# Patient Record
Sex: Female | Born: 1955
Health system: Southern US, Community
[De-identification: ages and names within clinical notes are randomized; demographics above are authoritative.]

## PROBLEM LIST (undated history)

## (undated) DIAGNOSIS — E785 Hyperlipidemia, unspecified: Secondary | ICD-10-CM

## (undated) DIAGNOSIS — E0590 Thyrotoxicosis, unspecified without thyrotoxic crisis or storm: Secondary | ICD-10-CM

## (undated) DIAGNOSIS — D259 Leiomyoma of uterus, unspecified: Secondary | ICD-10-CM

## (undated) DIAGNOSIS — K219 Gastro-esophageal reflux disease without esophagitis: Secondary | ICD-10-CM

## (undated) DIAGNOSIS — E119 Type 2 diabetes mellitus without complications: Secondary | ICD-10-CM

## (undated) DIAGNOSIS — R011 Cardiac murmur, unspecified: Secondary | ICD-10-CM

## (undated) DIAGNOSIS — M5136 Other intervertebral disc degeneration, lumbar region: Secondary | ICD-10-CM

## (undated) DIAGNOSIS — E049 Nontoxic goiter, unspecified: Secondary | ICD-10-CM

## (undated) DIAGNOSIS — M51369 Other intervertebral disc degeneration, lumbar region without mention of lumbar back pain or lower extremity pain: Secondary | ICD-10-CM

## (undated) DIAGNOSIS — I10 Essential (primary) hypertension: Secondary | ICD-10-CM

## (undated) HISTORY — DX: Other intervertebral disc degeneration, lumbar region: M51.36

## (undated) HISTORY — DX: Gastro-esophageal reflux disease without esophagitis: K21.9

## (undated) HISTORY — DX: Leiomyoma of uterus, unspecified: D25.9

## (undated) HISTORY — DX: Hyperlipidemia, unspecified: E78.5

## (undated) HISTORY — DX: Thyrotoxicosis, unspecified without thyrotoxic crisis or storm: E05.90

## (undated) HISTORY — PX: TUBAL LIGATION: SHX77

## (undated) HISTORY — DX: Cardiac murmur, unspecified: R01.1

## (undated) HISTORY — DX: Nontoxic goiter, unspecified: E04.9

## (undated) HISTORY — PX: WISDOM TOOTH EXTRACTION: SHX21

## (undated) HISTORY — DX: Other intervertebral disc degeneration, lumbar region without mention of lumbar back pain or lower extremity pain: M51.369

## (undated) HISTORY — DX: Type 2 diabetes mellitus without complications: E11.9

## (undated) HISTORY — DX: Essential (primary) hypertension: I10

---

## 1979-05-14 HISTORY — PX: TUBAL LIGATION: SHX77

## 2001-11-06 ENCOUNTER — Encounter: Admission: RE | Admit: 2001-11-06 | Discharge: 2001-11-06 | Payer: Self-pay | Admitting: Internal Medicine

## 2001-11-06 ENCOUNTER — Encounter: Payer: Self-pay | Admitting: Internal Medicine

## 2002-05-28 ENCOUNTER — Encounter: Payer: Self-pay | Admitting: Internal Medicine

## 2002-05-28 ENCOUNTER — Encounter: Admission: RE | Admit: 2002-05-28 | Discharge: 2002-05-28 | Payer: Self-pay | Admitting: Internal Medicine

## 2003-09-13 ENCOUNTER — Encounter: Payer: Self-pay | Admitting: Internal Medicine

## 2003-12-06 ENCOUNTER — Encounter: Admission: RE | Admit: 2003-12-06 | Discharge: 2003-12-06 | Payer: Self-pay | Admitting: Internal Medicine

## 2004-05-27 ENCOUNTER — Emergency Department (HOSPITAL_COMMUNITY): Admission: EM | Admit: 2004-05-27 | Discharge: 2004-05-27 | Payer: Self-pay | Admitting: Emergency Medicine

## 2004-05-28 ENCOUNTER — Ambulatory Visit: Payer: Self-pay | Admitting: Internal Medicine

## 2004-05-28 ENCOUNTER — Encounter: Admission: RE | Admit: 2004-05-28 | Discharge: 2004-05-28 | Payer: Self-pay | Admitting: Internal Medicine

## 2004-06-07 ENCOUNTER — Ambulatory Visit: Payer: Self-pay | Admitting: Internal Medicine

## 2004-06-14 ENCOUNTER — Ambulatory Visit: Payer: Self-pay | Admitting: Internal Medicine

## 2004-07-10 ENCOUNTER — Ambulatory Visit: Payer: Self-pay | Admitting: Internal Medicine

## 2004-07-11 ENCOUNTER — Encounter: Admission: RE | Admit: 2004-07-11 | Discharge: 2004-10-09 | Payer: Self-pay | Admitting: Internal Medicine

## 2004-07-18 ENCOUNTER — Other Ambulatory Visit: Admission: RE | Admit: 2004-07-18 | Discharge: 2004-07-18 | Payer: Self-pay | Admitting: Gynecology

## 2004-07-26 ENCOUNTER — Ambulatory Visit: Payer: Self-pay | Admitting: Internal Medicine

## 2004-08-08 ENCOUNTER — Ambulatory Visit: Payer: Self-pay | Admitting: Internal Medicine

## 2004-08-15 ENCOUNTER — Ambulatory Visit: Payer: Self-pay | Admitting: Internal Medicine

## 2004-11-29 ENCOUNTER — Ambulatory Visit: Payer: Self-pay | Admitting: Internal Medicine

## 2005-01-18 ENCOUNTER — Encounter: Admission: RE | Admit: 2005-01-18 | Discharge: 2005-04-18 | Payer: Self-pay | Admitting: Internal Medicine

## 2005-03-15 ENCOUNTER — Ambulatory Visit: Payer: Self-pay | Admitting: Internal Medicine

## 2005-04-11 ENCOUNTER — Ambulatory Visit: Payer: Self-pay | Admitting: Internal Medicine

## 2005-05-13 DIAGNOSIS — K219 Gastro-esophageal reflux disease without esophagitis: Secondary | ICD-10-CM

## 2005-05-13 HISTORY — DX: Gastro-esophageal reflux disease without esophagitis: K21.9

## 2005-09-12 ENCOUNTER — Ambulatory Visit: Payer: Self-pay | Admitting: Internal Medicine

## 2005-10-09 ENCOUNTER — Encounter: Admission: RE | Admit: 2005-10-09 | Discharge: 2005-10-09 | Payer: Self-pay | Admitting: Anesthesiology

## 2005-10-14 ENCOUNTER — Emergency Department (HOSPITAL_COMMUNITY): Admission: EM | Admit: 2005-10-14 | Discharge: 2005-10-14 | Payer: Self-pay | Admitting: Emergency Medicine

## 2005-12-23 ENCOUNTER — Ambulatory Visit: Payer: Self-pay | Admitting: Internal Medicine

## 2006-01-08 ENCOUNTER — Encounter: Admission: RE | Admit: 2006-01-08 | Discharge: 2006-01-08 | Payer: Self-pay | Admitting: Obstetrics and Gynecology

## 2006-01-14 ENCOUNTER — Ambulatory Visit: Payer: Self-pay | Admitting: Internal Medicine

## 2006-02-07 ENCOUNTER — Ambulatory Visit (HOSPITAL_COMMUNITY): Admission: RE | Admit: 2006-02-07 | Discharge: 2006-02-07 | Payer: Self-pay | Admitting: Gastroenterology

## 2006-08-06 ENCOUNTER — Ambulatory Visit: Payer: Self-pay | Admitting: Internal Medicine

## 2006-08-06 LAB — CONVERTED CEMR LAB
AST: 21 units/L (ref 0–37)
Calcium: 9.8 mg/dL (ref 8.4–10.5)
Chloride: 105 meq/L (ref 96–112)
Creatinine, Ser: 0.9 mg/dL (ref 0.4–1.2)
Eosinophils Absolute: 0.2 10*3/uL (ref 0.0–0.6)
Eosinophils Relative: 3.5 % (ref 0.0–5.0)
GFR calc non Af Amer: 71 mL/min
Glucose, Bld: 81 mg/dL (ref 70–99)
HCT: 39.9 % (ref 36.0–46.0)
Hgb A1c MFr Bld: 6.2 % — ABNORMAL HIGH (ref 4.6–6.0)
MCV: 83.8 fL (ref 78.0–100.0)
Platelets: 256 10*3/uL (ref 150–400)
RBC: 4.76 M/uL (ref 3.87–5.11)
RDW: 12.3 % (ref 11.5–14.6)
WBC: 5.1 10*3/uL (ref 4.5–10.5)

## 2006-09-01 ENCOUNTER — Other Ambulatory Visit: Admission: RE | Admit: 2006-09-01 | Discharge: 2006-09-01 | Payer: Self-pay | Admitting: Obstetrics & Gynecology

## 2006-09-11 ENCOUNTER — Encounter: Admission: RE | Admit: 2006-09-11 | Discharge: 2006-09-11 | Payer: Self-pay | Admitting: Occupational Medicine

## 2006-11-05 ENCOUNTER — Ambulatory Visit: Payer: Self-pay | Admitting: Internal Medicine

## 2006-11-05 DIAGNOSIS — E785 Hyperlipidemia, unspecified: Secondary | ICD-10-CM | POA: Insufficient documentation

## 2006-11-05 DIAGNOSIS — I10 Essential (primary) hypertension: Secondary | ICD-10-CM | POA: Insufficient documentation

## 2006-11-11 ENCOUNTER — Encounter (INDEPENDENT_AMBULATORY_CARE_PROVIDER_SITE_OTHER): Payer: Self-pay | Admitting: *Deleted

## 2007-09-02 ENCOUNTER — Other Ambulatory Visit: Admission: RE | Admit: 2007-09-02 | Discharge: 2007-09-02 | Payer: Self-pay | Admitting: Obstetrics & Gynecology

## 2007-10-13 ENCOUNTER — Ambulatory Visit: Payer: Self-pay | Admitting: Internal Medicine

## 2007-10-13 DIAGNOSIS — R635 Abnormal weight gain: Secondary | ICD-10-CM | POA: Insufficient documentation

## 2007-10-13 LAB — CONVERTED CEMR LAB
Cholesterol, target level: 200 mg/dL
HDL goal, serum: 40 mg/dL
LDL Goal: 160 mg/dL

## 2007-10-23 LAB — CONVERTED CEMR LAB
ALT: 23 units/L (ref 0–35)
Bilirubin, Direct: 0.1 mg/dL (ref 0.0–0.3)
Free T4: 0.9 ng/dL (ref 0.6–1.6)
Potassium: 3.8 meq/L (ref 3.5–5.1)
TSH: 0.42 microintl units/mL (ref 0.35–5.50)
Total Bilirubin: 1 mg/dL (ref 0.3–1.2)
VLDL: 20 mg/dL (ref 0–40)

## 2007-10-29 ENCOUNTER — Telehealth (INDEPENDENT_AMBULATORY_CARE_PROVIDER_SITE_OTHER): Payer: Self-pay | Admitting: *Deleted

## 2007-11-17 ENCOUNTER — Encounter: Admission: RE | Admit: 2007-11-17 | Discharge: 2008-01-07 | Payer: Self-pay | Admitting: Internal Medicine

## 2007-11-17 ENCOUNTER — Encounter: Payer: Self-pay | Admitting: Internal Medicine

## 2008-01-07 ENCOUNTER — Encounter: Payer: Self-pay | Admitting: Internal Medicine

## 2008-04-01 ENCOUNTER — Ambulatory Visit: Payer: Self-pay | Admitting: Internal Medicine

## 2008-04-01 ENCOUNTER — Telehealth (INDEPENDENT_AMBULATORY_CARE_PROVIDER_SITE_OTHER): Payer: Self-pay | Admitting: *Deleted

## 2008-04-01 DIAGNOSIS — M549 Dorsalgia, unspecified: Secondary | ICD-10-CM | POA: Insufficient documentation

## 2008-04-01 LAB — CONVERTED CEMR LAB
Ketones, urine, test strip: NEGATIVE
Nitrite: NEGATIVE
WBC, UA: NONE SEEN cells/hpf (ref <3)
pH: 6.5

## 2008-04-02 ENCOUNTER — Encounter: Payer: Self-pay | Admitting: Internal Medicine

## 2008-04-05 ENCOUNTER — Telehealth: Payer: Self-pay | Admitting: Internal Medicine

## 2008-04-13 ENCOUNTER — Encounter: Admission: RE | Admit: 2008-04-13 | Discharge: 2008-04-13 | Payer: Self-pay | Admitting: Internal Medicine

## 2008-04-15 ENCOUNTER — Telehealth (INDEPENDENT_AMBULATORY_CARE_PROVIDER_SITE_OTHER): Payer: Self-pay | Admitting: *Deleted

## 2008-05-30 ENCOUNTER — Telehealth (INDEPENDENT_AMBULATORY_CARE_PROVIDER_SITE_OTHER): Payer: Self-pay | Admitting: *Deleted

## 2008-08-11 LAB — CONVERTED CEMR LAB

## 2008-08-30 ENCOUNTER — Ambulatory Visit: Payer: Self-pay | Admitting: Internal Medicine

## 2008-08-30 DIAGNOSIS — M5137 Other intervertebral disc degeneration, lumbosacral region: Secondary | ICD-10-CM | POA: Insufficient documentation

## 2008-08-30 DIAGNOSIS — R946 Abnormal results of thyroid function studies: Secondary | ICD-10-CM | POA: Insufficient documentation

## 2008-08-30 DIAGNOSIS — R7309 Other abnormal glucose: Secondary | ICD-10-CM | POA: Insufficient documentation

## 2008-08-30 DIAGNOSIS — K219 Gastro-esophageal reflux disease without esophagitis: Secondary | ICD-10-CM | POA: Insufficient documentation

## 2008-09-03 ENCOUNTER — Encounter: Payer: Self-pay | Admitting: Internal Medicine

## 2008-09-06 ENCOUNTER — Encounter (INDEPENDENT_AMBULATORY_CARE_PROVIDER_SITE_OTHER): Payer: Self-pay | Admitting: *Deleted

## 2008-09-06 ENCOUNTER — Encounter: Admission: RE | Admit: 2008-09-06 | Discharge: 2008-09-06 | Payer: Self-pay | Admitting: Internal Medicine

## 2008-09-07 ENCOUNTER — Encounter (INDEPENDENT_AMBULATORY_CARE_PROVIDER_SITE_OTHER): Payer: Self-pay | Admitting: *Deleted

## 2008-09-07 ENCOUNTER — Telehealth: Payer: Self-pay | Admitting: Internal Medicine

## 2008-09-13 ENCOUNTER — Ambulatory Visit: Payer: Self-pay | Admitting: Internal Medicine

## 2008-09-13 DIAGNOSIS — E042 Nontoxic multinodular goiter: Secondary | ICD-10-CM | POA: Insufficient documentation

## 2008-09-14 ENCOUNTER — Encounter: Payer: Self-pay | Admitting: Internal Medicine

## 2008-09-18 LAB — CONVERTED CEMR LAB: Thyroglobulin Ab: 36.8 (ref 0.0–60.0)

## 2008-09-20 ENCOUNTER — Ambulatory Visit: Payer: Self-pay | Admitting: Endocrinology

## 2008-09-20 ENCOUNTER — Encounter (INDEPENDENT_AMBULATORY_CARE_PROVIDER_SITE_OTHER): Payer: Self-pay | Admitting: *Deleted

## 2008-10-05 ENCOUNTER — Telehealth (INDEPENDENT_AMBULATORY_CARE_PROVIDER_SITE_OTHER): Payer: Self-pay | Admitting: *Deleted

## 2008-10-12 ENCOUNTER — Encounter (INDEPENDENT_AMBULATORY_CARE_PROVIDER_SITE_OTHER): Payer: Self-pay | Admitting: *Deleted

## 2008-10-31 ENCOUNTER — Encounter (HOSPITAL_COMMUNITY): Admission: RE | Admit: 2008-10-31 | Discharge: 2009-01-29 | Payer: Self-pay | Admitting: Endocrinology

## 2008-11-02 DIAGNOSIS — Z8639 Personal history of other endocrine, nutritional and metabolic disease: Secondary | ICD-10-CM | POA: Insufficient documentation

## 2008-11-15 ENCOUNTER — Encounter (INDEPENDENT_AMBULATORY_CARE_PROVIDER_SITE_OTHER): Payer: Self-pay | Admitting: *Deleted

## 2009-01-19 ENCOUNTER — Telehealth (INDEPENDENT_AMBULATORY_CARE_PROVIDER_SITE_OTHER): Payer: Self-pay | Admitting: *Deleted

## 2009-01-27 ENCOUNTER — Ambulatory Visit: Payer: Self-pay | Admitting: Endocrinology

## 2009-01-27 DIAGNOSIS — R000 Tachycardia, unspecified: Secondary | ICD-10-CM | POA: Insufficient documentation

## 2009-02-17 ENCOUNTER — Ambulatory Visit (HOSPITAL_COMMUNITY): Admission: RE | Admit: 2009-02-17 | Discharge: 2009-02-17 | Payer: Self-pay | Admitting: Endocrinology

## 2009-03-22 ENCOUNTER — Telehealth (INDEPENDENT_AMBULATORY_CARE_PROVIDER_SITE_OTHER): Payer: Self-pay | Admitting: *Deleted

## 2009-04-10 ENCOUNTER — Telehealth (INDEPENDENT_AMBULATORY_CARE_PROVIDER_SITE_OTHER): Payer: Self-pay | Admitting: *Deleted

## 2009-04-13 ENCOUNTER — Ambulatory Visit: Payer: Self-pay | Admitting: Endocrinology

## 2009-05-13 DIAGNOSIS — E0590 Thyrotoxicosis, unspecified without thyrotoxic crisis or storm: Secondary | ICD-10-CM

## 2009-05-13 HISTORY — PX: OTHER SURGICAL HISTORY: SHX169

## 2009-05-13 HISTORY — DX: Thyrotoxicosis, unspecified without thyrotoxic crisis or storm: E05.90

## 2009-05-18 ENCOUNTER — Ambulatory Visit: Payer: Self-pay | Admitting: Endocrinology

## 2009-08-04 ENCOUNTER — Ambulatory Visit: Payer: Self-pay | Admitting: Endocrinology

## 2009-12-15 ENCOUNTER — Ambulatory Visit: Payer: Self-pay | Admitting: Endocrinology

## 2009-12-18 ENCOUNTER — Telehealth (INDEPENDENT_AMBULATORY_CARE_PROVIDER_SITE_OTHER): Payer: Self-pay | Admitting: *Deleted

## 2009-12-20 ENCOUNTER — Ambulatory Visit: Payer: Self-pay | Admitting: Internal Medicine

## 2010-04-24 ENCOUNTER — Encounter (INDEPENDENT_AMBULATORY_CARE_PROVIDER_SITE_OTHER): Payer: Self-pay | Admitting: *Deleted

## 2010-04-24 ENCOUNTER — Ambulatory Visit: Payer: Self-pay | Admitting: Internal Medicine

## 2010-04-24 DIAGNOSIS — R51 Headache: Secondary | ICD-10-CM | POA: Insufficient documentation

## 2010-04-24 DIAGNOSIS — R519 Headache, unspecified: Secondary | ICD-10-CM | POA: Insufficient documentation

## 2010-04-24 DIAGNOSIS — E119 Type 2 diabetes mellitus without complications: Secondary | ICD-10-CM | POA: Insufficient documentation

## 2010-04-25 LAB — CONVERTED CEMR LAB
Creatinine,U: 88.1 mg/dL
Microalb Creat Ratio: 1.2 mg/g (ref 0.0–30.0)
Microalb, Ur: 1.1 mg/dL (ref 0.0–1.9)

## 2010-06-04 ENCOUNTER — Encounter: Payer: Self-pay | Admitting: Endocrinology

## 2010-06-04 ENCOUNTER — Ambulatory Visit
Admission: RE | Admit: 2010-06-04 | Discharge: 2010-06-04 | Payer: Self-pay | Source: Home / Self Care | Attending: Internal Medicine | Admitting: Internal Medicine

## 2010-06-04 DIAGNOSIS — M545 Low back pain, unspecified: Secondary | ICD-10-CM | POA: Insufficient documentation

## 2010-06-10 LAB — CONVERTED CEMR LAB
ALT: 19 units/L (ref 0–35)
ALT: 22 units/L (ref 0–35)
AST: 20 units/L (ref 0–37)
Albumin: 4.5 g/dL (ref 3.5–5.2)
Alkaline Phosphatase: 100 units/L (ref 39–117)
Alkaline Phosphatase: 108 units/L (ref 39–117)
Alkaline Phosphatase: 95 units/L (ref 39–117)
BUN: 16 mg/dL (ref 6–23)
BUN: 17 mg/dL (ref 6–23)
Basophils Absolute: 0.1 10*3/uL (ref 0.0–0.1)
Basophils Absolute: 0.1 10*3/uL (ref 0.0–0.1)
Basophils Relative: 0 % (ref 0.0–3.0)
Bilirubin, Direct: 0.1 mg/dL (ref 0.0–0.3)
Bilirubin, Direct: 0.1 mg/dL (ref 0.0–0.3)
Calcium: 10 mg/dL (ref 8.4–10.5)
Calcium: 10.4 mg/dL (ref 8.4–10.5)
Cholesterol, target level: 200 mg/dL
Cholesterol: 184 mg/dL (ref 0–200)
Cholesterol: 204 mg/dL (ref 0–200)
Creatinine, Ser: 1 mg/dL (ref 0.4–1.2)
Creatinine, Ser: 1 mg/dL (ref 0.4–1.2)
Direct LDL: 130.2 mg/dL
Eosinophils Absolute: 0.2 10*3/uL (ref 0.0–0.6)
Eosinophils Absolute: 0.2 10*3/uL (ref 0.0–0.7)
Eosinophils Relative: 3.3 % (ref 0.0–5.0)
Eosinophils Relative: 3.5 % (ref 0.0–5.0)
Free T4: 0.7 ng/dL (ref 0.6–1.6)
GFR calc Af Amer: 61 mL/min
GFR calc non Af Amer: 51 mL/min
GFR calc non Af Amer: 74.98 mL/min (ref >60)
GFR calc non Af Amer: 75.47 mL/min (ref >60)
Glucose, Bld: 89 mg/dL (ref 70–99)
HCT: 44 % (ref 36.0–46.0)
HDL: 46.7 mg/dL (ref >39.00)
HDL: 48.2 mg/dL (ref >39.0)
HDL: 50.1 mg/dL (ref >39.00)
Hemoglobin: 12.8 g/dL (ref 12.0–15.0)
LDL Cholesterol: 113 mg/dL — ABNORMAL HIGH (ref 0–99)
LDL Cholesterol: 120 mg/dL — ABNORMAL HIGH (ref 0–99)
Lymphocytes Relative: 32.1 % (ref 12.0–46.0)
Lymphocytes Relative: 38 % (ref 12.0–46.0)
Lymphocytes Relative: 38.5 % (ref 12.0–46.0)
MCHC: 32.4 g/dL (ref 30.0–36.0)
Monocytes Absolute: 0.3 10*3/uL (ref 0.2–0.7)
Monocytes Relative: 4.9 % (ref 3.0–11.0)
Monocytes Relative: 6.8 % (ref 3.0–12.0)
Neutro Abs: 3 10*3/uL (ref 1.4–7.7)
Neutrophils Relative %: 53.4 % (ref 43.0–77.0)
Neutrophils Relative %: 56.8 % (ref 43.0–77.0)
Platelets: 234 10*3/uL (ref 150.0–400.0)
Potassium: 4.3 meq/L (ref 3.5–5.1)
Potassium: 4.6 meq/L (ref 3.5–5.1)
RBC: 4.92 M/uL (ref 3.87–5.11)
RDW: 13.7 % (ref 11.5–14.6)
T3, Free: 3.5 pg/mL (ref 2.3–4.2)
TSH: 0.37 microintl units/mL (ref 0.35–5.50)
TSH: 0.4 microintl units/mL (ref 0.35–5.50)
TSH: 1.27 microintl units/mL (ref 0.35–5.50)
Total Bilirubin: 0.5 mg/dL (ref 0.3–1.2)
Total Bilirubin: 0.8 mg/dL (ref 0.3–1.2)
Total CHOL/HDL Ratio: 4
Total CHOL/HDL Ratio: 4.2
Total Protein: 8.1 g/dL (ref 6.0–8.3)
Total Protein: 8.2 g/dL (ref 6.0–8.3)
Triglycerides: 83 mg/dL (ref 0.0–149.0)
VLDL: 16.6 mg/dL (ref 0.0–40.0)
VLDL: 20.6 mg/dL (ref 0.0–40.0)
WBC: 5.6 10*3/uL (ref 4.5–10.5)
WBC: 6.8 10*3/uL (ref 4.5–10.5)

## 2010-06-12 NOTE — Assessment & Plan Note (Signed)
Summary: CPX AND FASTING LABS////SPH   Vital Signs:  Patient profile:   55 year old female Height:      67.25 inches Weight:      226.8 pounds BMI:     35.39 Temp:     98.7 degrees F oral Pulse rate:   64 / minute Resp:     14 per minute BP sitting:   114 / 70  (left arm) Cuff size:   large  Vitals Entered By: Shonna Chock CMA (December 20, 2009 9:56 AM)  Comments **Patient was offered to update TDaP vaccine, patient decided to decline at this time   History of Present Illness: Kathleen Lucero is here for a physical; she is asymptomatic. Hyperlipidemia Follow-Up      This is a 55 year old woman who presents for Hyperlipidemia follow-up.  The patient denies muscle aches, GI upset, abdominal pain, flushing, itching, constipation, diarrhea, and fatigue.  Other symptoms include pedal edema occasionally @ end of day.  The patient denies the following symptoms: chest pain/pressure, exercise intolerance, dypsnea, palpitations, and syncope.  Compliance with medications (by patient report) has been near 100%.  Dietary compliance has been fair.  Adjunctive measures currently used by the patient include fish oil supplements.  Hypertension Follow-Up      The patient also presents for Hypertension follow-up.  The patient denies lightheadedness, urinary frequency, and headaches.  Compliance with medications (by patient report) has been near 100%.  Adjunctive measures currently used by the patient include salt restriction.  BP @ home 115-118/82 on average.  Lipid Management History:      Positive NCEP/ATP III risk factors include hypertension.  Negative NCEP/ATP III risk factors include female age less than 82 years old, non-diabetic, no family history for ischemic heart disease, non-tobacco-user status, no ASHD (atherosclerotic heart disease), no prior stroke/TIA, no peripheral vascular disease, and no history of aortic aneurysm.     Current Medications (verified): 1)  Cartia Xt 180 Mg  Cp24  (Diltiazem Hcl Coated Beads) .Marland Kitchen.. 1 Daily  **appointment Due** 2)  Crestor 20 Mg  Tabs (Rosuvastatin Calcium) .... Daily  As Directed 3)  Spironolactone 25 Mg  Tabs (Spironolactone) .Marland Kitchen.. 1 By Mouth Bid 4)  Amoxicillin 500 Mg Caps (Amoxicillin) .... 4 Tabs, 1 Hr Prior To Dental Procedure  Allergies: 1)  ! * Normadyne 2)  ! * Zestorectic 3)  ! Codeine Sulfate (Codeine Sulfate)  Past History:  Past Medical History: Hypertension hyperlipidemia: NMR 2005: LDL 159(1548/ 501), HDL 41, TG 107. LDL goal = < 140. Framingham Study LDL goal = < 160. Heart Murmur Goiter Utreine fibroids with anemia GERD 2007, seen in ER for presentation as chest pain T3 Thyrotoxicosis, PMH of, S/P RAI  ; DDD ,S/P ruptured disc, Dr Darrelyn Hillock  Past Surgical History: Tubal ligation; G 4 P3; Wisdom teeth Extraction  Family History: Father: negative Mother: HTN,CVA Siblings: bros: HTN; bro: rectal cancer; MGM: HTN ,thyroid disease,DM; M uncle: dementia;  Social History: Never Smoked Alcohol use-no Occupation: Production designer, theatre/television/film Regular exercise-yes :walking 2x/week married  Review of Systems  The patient denies anorexia, fever, weight loss, weight gain, vision loss, decreased hearing, hoarseness, prolonged cough, hemoptysis, melena, hematochezia, severe indigestion/heartburn, incontinence, suspicious skin lesions, depression, unusual weight change, abnormal bleeding, enlarged lymph nodes, and angioedema.         microscopic hematuria evaluated by Leda Quail, MD, Gyn.  Physical Exam  General:  well-nourished; alert,appropriate and cooperative throughout examination Head:  Normocephalic and atraumatic without obvious abnormalities.  Eyes:  No  corneal or conjunctival inflammation noted.Perrla. Funduscopic exam benign, without hemorrhages, exudates or papilledema.  Ears:  External ear exam shows no significant lesions or deformities.  Otoscopic examination reveals clear canals, tympanic membranes are intact  bilaterally without bulging, retraction, inflammation or discharge. Hearing is grossly normal bilaterally. Nose:  External nasal examination shows no deformity or inflammation. Nasal mucosa are pink and moist without lesions or exudates. Mouth:  Oral mucosa and oropharynx without lesions or exudates.  Lower partial Neck:  No deformities, masses, or tenderness noted. Lungs:  Normal respiratory effort, chest expands symmetrically. Lungs are clear to auscultation, no crackles or wheezes. Heart:  normal rate, regular rhythm, no gallop, no rub, no JVD, no HJR, and grade 1 /6 systolic murmur.   Abdomen:  Bowel sounds positive,abdomen soft and non-tender without masses, organomegaly or hernias noted. Genitalia:  Dr Hyacinth Meeker Msk:  No deformity or scoliosis noted of thoracic or lumbar spine.   Pulses:  R and L carotid,radial,dorsalis pedis and posterior tibial pulses are full and equal bilaterally Extremities:  No clubbing, cyanosis, edema, or deformity noted with normal full range of motion of all joints.   Neurologic:  alert & oriented X3 and DTRs symmetrical and normal.   Skin:  Intact without suspicious lesions or rashes Cervical Nodes:  No lymphadenopathy noted Axillary Nodes:  No palpable lymphadenopathy Psych:  memory intact for recent and remote, normally interactive, and good eye contact.     Impression & Recommendations:  Problem # 1:  ROUTINE GENERAL MEDICAL EXAM@HEALTH  CARE FACL (ICD-V70.0)  Orders: EKG w/ Interpretation (93000) Venipuncture (33295) TLB-Lipid Panel (80061-LIPID) TLB-BMP (Basic Metabolic Panel-BMET) (80048-METABOL) TLB-CBC Platelet - w/Differential (85025-CBCD) TLB-Hepatic/Liver Function Pnl (80076-HEPATIC) TLB-TSH (Thyroid Stimulating Hormone) (84443-TSH) TLB-A1C / Hgb A1C (Glycohemoglobin) (83036-A1C)  Problem # 2:  HYPERTHYROIDISM (ICD-242.90)  S/P RAI therapy  Orders: Venipuncture (18841)  Problem # 3:  HYPERLIPIDEMIA NEC/NOS (ICD-272.4)  Her updated  medication list for this problem includes:    Crestor 20 Mg Tabs (Rosuvastatin calcium) .Marland Kitchen... Daily  as directed  Orders: Venipuncture (66063)  Problem # 4:  HYPERTENSION, ESSENTIAL NOS (ICD-401.9)  Controlled Her updated medication list for this problem includes:    Cartia Xt 180 Mg Cp24 (Diltiazem hcl coated beads) .Marland Kitchen... 1 daily    Spironolactone 25 Mg Tabs (Spironolactone) .Marland Kitchen... 1 by mouth bid  Orders: Venipuncture (01601)  Problem # 5:  FASTING HYPERGLYCEMIA (ICD-790.29)  Orders: Venipuncture (09323) TLB-A1C / Hgb A1C (Glycohemoglobin) (83036-A1C)  Complete Medication List: 1)  Cartia Xt 180 Mg Cp24 (Diltiazem hcl coated beads) .Marland Kitchen.. 1 daily 2)  Crestor 20 Mg Tabs (Rosuvastatin calcium) .... Daily  as directed 3)  Spironolactone 25 Mg Tabs (Spironolactone) .Marland Kitchen.. 1 by mouth bid 4)  Amoxicillin 500 Mg Caps (Amoxicillin) .... 4 tabs, 1 hr prior to dental procedure  Lipid Assessment/Plan:      Based on NCEP/ATP III, the patient's risk factor category is "0-1 risk factors".  The patient's lipid goals are as follows: Total cholesterol goal is 200; LDL cholesterol goal is 140; HDL cholesterol goal is 50; Triglyceride goal is 150.  Her LDL cholesterol goal has been met.    Patient Instructions: 1)  It is important that you exercise regularly at least 20 minutes 5 times a week. If you develop chest pain, have severe difficulty breathing, or feel very tired , stop exercising immediately and seek medical attention. 2)  Take an  81 mg coated Aspirin every day with a meal. 3)  Check your Blood Pressure regularly. If it is  above:135/85 ON AVERAGE  you should make an appointment. 4)  Complete the  colonoscopy as scheduled. Prescriptions: AMOXICILLIN 500 MG CAPS (AMOXICILLIN) 4 tabs, 1 hr prior to dental procedure  #20 x 0   Entered and Authorized by:   Kathleen Melnick MD   Signed by:   Kathleen Melnick MD on 12/20/2009   Method used:   Print then Give to Patient   RxID:    [id number] SPIRONOLACTONE 25 MG  TABS (SPIRONOLACTONE) 1 by mouth bid  #180 x 3   Entered and Authorized by:   Kathleen Melnick MD   Signed by:   Kathleen Melnick MD on 12/20/2009   Method used:   Print then Give to Patient   RxID:   [id number] CRESTOR 20 MG  TABS (ROSUVASTATIN CALCIUM) daily  as directed  #90 x 0   Entered and Authorized by:   Kathleen Melnick MD   Signed by:   Kathleen Melnick MD on 12/20/2009   Method used:   Print then Give to Patient   RxID:   [id number] CARTIA XT 180 MG  CP24 (DILTIAZEM HCL COATED BEADS) 1 daily  #90 x 3   Entered and Authorized by:   Kathleen Melnick MD   Signed by:   Kathleen Melnick MD on 12/20/2009   Method used:   Print then Give to Patient   RxID:   [id number]     Appended Document: CPX AND FASTING LABS////SPH

## 2010-06-12 NOTE — Progress Notes (Signed)
Summary: REFILL REQUEST  Phone Note Refill Request Message from:  Pharmacy on December 18, 2009 4:29 PM  Refills Requested: Medication #1:  CARTIA XT 180 MG  CP24 1 daily   Dosage confirmed as above?Dosage Confirmed   Supply Requested: 1 month [facility] [address]  Next Appointment Scheduled: NOVEMBER 25TH 2011 Initial call taken by: Lavell Islam,  December 18, 2009 4:30 PM  Follow-up for Phone Call        Patient needs to schedule appointment for CPX Follow-up by: Shonna Chock CMA,  December 18, 2009 4:48 PM    New/Updated Medications: CARTIA XT 180 MG  CP24 (DILTIAZEM HCL COATED BEADS) 1 daily  **APPOINTMENT DUE** Prescriptions: CARTIA XT 180 MG  CP24 (DILTIAZEM HCL COATED BEADS) 1 daily  **APPOINTMENT DUE**  #30 x 0   Entered by:   Shonna Chock CMA   Authorized by:   Marga Melnick MD   Signed by:   Shonna Chock CMA on 12/18/2009   Method used:   [facility] [phone][facility] (retail)       [address]       [place], [place]  [id number]       Ph: [phone] or [phone]       Fax: [phone]   RxID:   [id number]

## 2010-06-12 NOTE — Assessment & Plan Note (Signed)
Summary: NEW ENDO CONSULT/GOITER/ABN THYROID STUDY/BCBS/ $50 /NWS   Vital Signs:  Patient profile:   55 year old female Height:      67.5 inches Weight:      224 pounds BMI:     34.69 Temp:     98.6 degrees F oral Pulse rate:   68 / minute BP sitting:   118 / 88  (left arm)  Vitals Entered By: Bill Salinas CMA (Sep 20, 2008 10:29 AM) CC: New endo cosult for abnormal thyroid fuct. study/ab   CC:  New endo cosult for abnormal thyroid fuct. study/ab.  History of Present Illness: pt was noted in 2007 to have a goiter.  it was noted to have enlarged on 3/10 [place].  pt does not notice the goiter.  she does, however, have 1 month of a mild burning sensation in the throat, but no associated dysphagia  Current Medications (verified): 1)  Cartia Xt 180 Mg  Cp24 (Diltiazem Hcl Coated Beads) .Marland Kitchen.. 1 Daily 2)  Folic Acid 3)  Crestor 20 Mg  Tabs (Rosuvastatin Calcium) .... Daily  As Directed 4)  Spironolactone 25 Mg  Tabs (Spironolactone) .Marland Kitchen.. 1 By Mouth Bid 5)  Amoxil 500 Mg Caps (Amoxicillin) .... 4 By Mouth Preoperation  Allergies (verified): 1)  ! * Normadyne 2)  ! * Zestorectic 3)  ! Codeine Sulfate (Codeine Sulfate)  Past History:  Past Medical History:    Hypertension    hyperlipidemia    Heart Murmur    Goiter    Utreine fibroids with anemia    GERD 2007, seen in ER    T3 Thyrotoxicosis, PMH of ; DDD ,S/P ruptured disc, Dr Darrelyn Hillock (08/30/2008)  Family History:    Reviewed history from 08/30/2008 and no changes required:       Family History Hypertension       Family Histroy Dementia       Family History Stroke       Father: neg       Mother: HTN,CVA       Siblings: bros HTN; bro rectal CA; MGM HTN ,thyroid disease,DM; M uncle dementia       grandmother had hypothyroidism  Social History:    Reviewed history from 08/30/2008 and no changes required:       Never Smoked       Alcohol use-no       Occupation: Restaurant manager, fast food       Regular exercise-yes :walking  married  Review of Systems       denies headache, hoarseness, double vision, palpitations, sob, diarrhea, polyuria, muscle weakness, excessive diaphoresis, numbness, tremor, anxiety, hypoglycemia, bruising, rhinorrhea.  has lost few lbs, due to her efforts   Physical Exam  General:  obese.  this limits exam Head:  head: no deformity eyes: no periorbital swelling, no proptosis external nose and ears are normal mouth: no lesion seen  Neck:  i can feel there is a goiter, but i cannot tell details Lungs:  Clear to auscultation bilaterally. Normal respiratory effort.  Heart:  Regular rate and rhythm without murmurs or gallops noted. Normal S1,S2.   Msk:  muscle bulk and strength are grossly mormal.  no obvious joint swelling.  gait is normal and steady  Extremities:  no deformity.  no edema Neurologic:  cn 2-12 grossly intact.   readily moves all 4's.   sensation is intact to touch on all 4's Skin:  normal texture and temp.  no rash.  not diaphoretic  Cervical Nodes:  No significant adenopathy.  Psych:  Alert and cooperative; normal mood and affect; normal attention span and concentration.   Additional Exam:  THYROID ULTRASOUND 1.  Progression of numerous solid and cystic lesions throughout the thyroid, most consist with multinodular goiter. 2.  Especially on the right, nodules appear new or progressive. Tissue sampling of one or more of these nodules should be considered. 3.  Direct comparison with the prior exam is difficult due to the extent of thyroid nodules on the current exam.  FastTSH              [L]  0.33 uIU/mL                 0.35-5.50 Free T4                   0.8 ng/dL                   9.8-1.1 Free T3                   3.5 pg/mL                   2.3-4.2    Impression & Recommendations:  Problem # 1:  GOITER, MULTINODULAR (ICD-241.1)  Problem # 2:  hyperthyroidism mild, due to #1  Problem # 3:  throat sensation uncertain etiology, not  thyroid-related  Other Orders: Radiology Referral (Radiology) Consultation Level IV (91478)  Patient Instructions: 1)  we discussed the causes, risks, and rx options of hyperthyroidism 2)  check scan, with plan for i-131 rx

## 2010-06-12 NOTE — Assessment & Plan Note (Signed)
Summary: 1 MTH FU  STC   Vital Signs:  Patient profile:   55 year old female Height:      67.5 inches (171.45 cm) Weight:      226.25 pounds (102.84 kg) O2 Sat:      98 % on Room air Temp:     97.2 degrees F (36.22 degrees C) oral Pulse rate:   73 / minute BP sitting:   126 / 82  (left arm) Cuff size:   large  Vitals Entered By: Josph Macho CMA (May 18, 2009 1:51 PM)  O2 Flow:  Room air CC: 1 month follow up/ CF Is Patient Diabetic? No   CC:  1 month follow up/ CF.  History of Present Illness: pt is 3 mos s/p i-131 rx for hyperthyroidism due to multinodular goiter.  pt states she feels well in general.  she has lost a few lbs, due to her efforts.  Current Medications (verified): 1)  Cartia Xt 180 Mg  Cp24 (Diltiazem Hcl Coated Beads) .Marland Kitchen.. 1 Daily 2)  Folic Acid 3)  Crestor 20 Mg  Tabs (Rosuvastatin Calcium) .... Daily  As Directed 4)  Spironolactone 25 Mg  Tabs (Spironolactone) .Marland Kitchen.. 1 By Mouth Bid  Allergies (verified): 1)  ! * Normadyne 2)  ! * Zestorectic 3)  ! Codeine Sulfate (Codeine Sulfate)  Past History:  Past Medical History: Last updated: 08/30/2008 Hypertension hyperlipidemia Heart Murmur Goiter Utreine fibroids with anemia GERD 2007, seen in ER T3 Thyrotoxicosis, PMH of ; DDD ,S/P ruptured disc, Dr Darrelyn Hillock  Review of Systems       she denies neck swelling.  Physical Exam  General:  obese.  no distress  Neck:  i can feel there is a large goiter, but i cannot tell details Additional Exam:  FastTSH                   0.87 uIU/mL                 0.35-5.50 Free T4                   0.9 ng/dL     Impression & Recommendations:  Problem # 1:  GOITER, MULTINODULAR (ICD-241.1) Assessment Unchanged  Problem # 2:  HYPERTHYROIDISM (ICD-242.90) Assessment: Improved  Other Orders: TLB-TSH (Thyroid Stimulating Hormone) (84443-TSH) TLB-T4 (Thyrox), Free [phone]) Est. Patient Level III (40981)  Patient Instructions: 1)  tests are being  ordered for you today.  a few days after the test(s), please call [phone] to hear your test results. 2)  Please schedule a follow-up appointment in 2 months. 3)  (update: i left message on phone-tree:  rx as we discussed)  Preventive Care Screening  Mammogram:    Date:  10/11/2008    Results:  historical   Hemoccult:    Date:  08/11/2008    Results:  historical   Pap Smear:    Date:  08/11/2008    Results:  historical

## 2010-06-12 NOTE — Assessment & Plan Note (Signed)
Summary: 2 MO ROV /NWS    Vital Signs:  Patient profile:   55 year old female Height:      67 inches (170.18 cm) Weight:      227.50 pounds (103.41 kg) O2 Sat:      97 % on Room air Temp:     98 degrees F (36.67 degrees C) oral Pulse rate:   90 / minute BP sitting:   112 / 78  (left arm) Cuff size:   large  Vitals Entered By: Josph Macho RMA (August 04, 2009 2:15 PM) Taken by Sydell Axon  O2 Flow:  Room air CC: 2 month follow up. //[place]   CC:  2 month follow up. //[place].  History of Present Illness: pt says she does not notice the goiter.  she feels more energetic recently.   Current Medications (verified): 1)  Cartia Xt 180 Mg  Cp24 (Diltiazem Hcl Coated Beads) .Marland Kitchen.. 1 Daily 2)  Folic Acid 3)  Crestor 20 Mg  Tabs (Rosuvastatin Calcium) .... Daily  As Directed 4)  Spironolactone 25 Mg  Tabs (Spironolactone) .Marland Kitchen.. 1 By Mouth Bid  Allergies (verified): 1)  ! * Normadyne 2)  ! * Zestorectic 3)  ! Codeine Sulfate (Codeine Sulfate)  Past History:  Past Medical History: Last updated: 08/30/2008 Hypertension hyperlipidemia Heart Murmur Goiter Utreine fibroids with anemia GERD 2007, seen in ER T3 Thyrotoxicosis, PMH of ; DDD ,S/P ruptured disc, Dr Darrelyn Hillock  Social History: Reviewed history from 09/20/2008 and no changes required. Never Smoked Alcohol use-no Occupation: Managerial Regular exercise-yes :walking married  Review of Systems       fatigue is improved  Physical Exam  General:  normal appearance.   Neck:  i can feel there is a large goiter, but i cannot tell details Additional Exam:  FastTSH                   0.96 uIU/mL      Impression & Recommendations:  Problem # 1:  GOITER, MULTINODULAR (ICD-241.1) Assessment Unchanged  Problem # 2:  HYPERTHYROIDISM (ICD-242.90) Assessment: Improved  Medications Added to Medication List This Visit: 1)  Amoxicillin 500 Mg Caps (Amoxicillin) .... 4 tabs, 1 hr prior to dental procedure  Other  Orders: TLB-TSH (Thyroid Stimulating Hormone) (40102-VOZ)  Patient Instructions: 1)  so far, no medication has been needed for your thyroid, since the iodine treatment. 2)  tests are being ordered for you today.  a few days after the test(s), please call [phone] to hear your test results. 3)  return 4 months, when you will be due for a repeat ultrasound 4)  (update: i left message on phone-tree:  rx as we discussed) Prescriptions: AMOXICILLIN 500 MG CAPS (AMOXICILLIN) 4 tabs, 1 hr prior to dental procedure  #4 x 0   Entered and Authorized by:   Minus Breeding MD   Signed by:   Minus Breeding MD on 08/04/2009   Method used:   [facility]        [facility] [address] [phone]* (retail)       [address]       [place], [place]  [id number]       Ph: [phone] or [phone]       Fax: [phone]   RxID:   [id number]

## 2010-06-14 NOTE — Miscellaneous (Signed)
Summary: Orders Update  Clinical Lists Changes  Orders: Added new Referral order of Ophthalmology Referral (Ophthalmology) - Signed 

## 2010-06-14 NOTE — Assessment & Plan Note (Signed)
Summary: back pain//lch   Vital Signs:  Patient profile:   55 year old female Weight:      22.4 pounds BMI:     3.49 Temp:     98.3 degrees F oral Pulse rate:   76 / minute Resp:     15 per minute BP sitting:   120 / 82  (left arm) Cuff size:   large  Vitals Entered By: Shonna Chock CMA (June 04, 2010 1:45 PM) CC: Back Pain since Saturday, patient states " I made a wrong move when putting on some jeans.", Back pain   CC:  Back Pain since Saturday, patient states " I made a wrong move when putting on some jeans.", and Back pain.  History of Present Illness:      This is a 55 year old woman who presents with back pain since 01/21.  The patient denies fever, chills, weakness, loss of sensation, fecal incontinence, urinary incontinence, and urinary retention.  The pain is located in the mid low back.  The  sharp pain began at home, suddenly, and w/o  lifting, straining or injury. It occurred pulling up jeans while sitting on bed.  The pain radiates to the left leg  above  the knee.  The pain is made worse by standing up  from sitting. The pain is essentially constant , but it is worse with certain movements. The pain is made partially better by NSAID medications and heat.  Risk factors for serious underlying conditions include history of "ruptured disc" in 2006, treated by PT &  [facility].  Allergies: 1)  ! * Normadyne 2)  ! * Zestorectic 3)  ! Codeine Sulfate (Codeine Sulfate)  Past History:  Past Medical History: Hypertension hyperlipidemia: NMR 2005: LDL 159(1548/ 501), HDL 41, TG 107. LDL goal = < 140. Framingham Study LDL goal = < 160. Heart Murmur Goiter Utreine fibroids with anemia GERD 2007, seen in ER for presentation as chest pain T3 Thyrotoxicosis, PMH of, S/P RAI  ; DDD ,S/P ruptured disc, Dr  Worthy Rancher  Physical Exam  General:  Obviously uncomfortable,in no acute distress; alert,appropriate and cooperative throughout examination Lungs:  Normal respiratory  effort, chest expands symmetrically. Lungs are clear to auscultation, no crackles or wheezes. Heart:  normal rate, regular rhythm, no gallop, no rub, no JVD, and grade 1 /6 systolic murmur.   Abdomen:  Bowel sounds positive,abdomen soft and non-tender without masses, organomegaly or hernias noted. No AAA Msk:  No deformity or scoliosis noted of thoracic or lumbar spine.  Classic low back crawl down & up exam table & getting out of chair Pulses:  R and L carotid,radial,dorsalis pedis and posterior tibial pulses are full and equal bilaterally Extremities:  No clubbing, cyanosis, edema, or deformity noted with normal full range of motion of all joints.  Neg SLR  Neurologic:  alert & oriented X3, strength normal in all extremities, gait abnormal ( broad &  slow  but able to toe & heel walk)Classic , and DTRs symmetrical and normal.   Skin:  Intact without suspicious lesions or rashes Cervical Nodes:  No lymphadenopathy noted Axillary Nodes:  No palpable lymphadenopathy Psych:  memory intact for recent and remote, normally interactive, and good eye contact.     Impression & Recommendations:  Problem # 1:  LOW BACK PAIN, ACUTE (ICD-724.2)  Orders: Physical Therapy Referral (PT)  Her updated medication list for this problem includes:    Meloxicam 7.5 Mg Tabs (Meloxicam) .Marland Kitchen... 1 two times  a day    Cyclobenzaprine Hcl 5 Mg Tabs (Cyclobenzaprine hcl) .Marland Kitchen... 1 two times a day & 1-2 at bedtime as needed for back pain  Complete Medication List: 1)  Cartia Xt 180 Mg Cp24 (Diltiazem hcl coated beads) .Marland Kitchen.. 1 daily 2)  Crestor 20 Mg Tabs (Rosuvastatin calcium) .... Daily  as directed 3)  Spironolactone 25 Mg Tabs (Spironolactone) .Marland Kitchen.. 1 by mouth bid 4)  Amoxicillin 500 Mg Caps (Amoxicillin) .... 4 tabs, 1 hr prior to dental procedure 5)  Fluticasone Propionate 50 Mcg/act Susp (Fluticasone propionate) .Marland Kitchen.. 1 spray two times a day as needed 6)  Meloxicam 7.5 Mg Tabs (Meloxicam) .Marland Kitchen.. 1 two times a day 7)   Cyclobenzaprine Hcl 5 Mg Tabs (Cyclobenzaprine hcl) .Marland Kitchen.. 1 two times a day & 1-2 at bedtime as needed for back pain  Patient Instructions: 1)  Recommended remaining out of work for 01/23 & 01/24. Prescriptions: CYCLOBENZAPRINE HCL 5 MG TABS (CYCLOBENZAPRINE HCL) 1 two times a day & 1-2 at bedtime as needed for back pain  #20 x 0   Entered and Authorized by:   Marga Melnick MD   Signed by:   Marga Melnick MD on 06/04/2010   Method used:   [facility]        [facility] [address] [phone]* (retail)       [address]       [place], [place]  [address]       Ph: [phone] or [phone]       Fax: [phone]   RxID:   [id number] MELOXICAM 7.5 MG TABS (MELOXICAM) 1 two times a day  #20 x 0   Entered and Authorized by:   Marga Melnick MD   Signed by:   Marga Melnick MD on 06/04/2010   Method used:   [facility]        [facility] [address] [phone]* (retail)       [address]       [place], [place]  [address]       Ph: [phone] or [phone]       Fax: [phone]   RxID:   [id number]    Orders Added: 1)  Est. Patient Level IV [17616] 2)  Physical Therapy Referral [PT]

## 2010-06-14 NOTE — Letter (Signed)
Summary: Work Dietitian at [employer]  [address] [place], [place] [address]   Phone: [phone]  Fax: [phone]    Today's Date: April 24, 2010  Name of Patient: Kathleen Lucero  The above named patient had a medical visit today at:  10:30am / pm.  Please take this into consideration when reviewing the time away from work/school.    Special Instructions:  [  ] None  Arly.Keller  ] To be off the remainder of today, returning to the normal work / school schedule tomorrow.  [  ] To be off until the next scheduled appointment on ______________________.  [  ] Other ________________________________________________________________ ________________________________________________________________________   Sincerely yours,   Shonna Chock CMA

## 2010-06-14 NOTE — Assessment & Plan Note (Signed)
Summary: HEADACHES W/NOSEBLEED/RH....   Vital Signs:  Patient profile:   55 year old female Weight:      226.6 pounds BMI:     35.35 Temp:     98.7 degrees F oral Pulse (ortho):   68 / minute Resp:     15 per minute BP supine:   124 / 88 BP sitting:   126 / 86 BP standing:   120 / 84  Vitals Entered By: Shonna Chock CMA (April 24, 2010 10:55 AM)  Serial Vital Signs/Assessments:  Time      Position  BP       Pulse  Resp  Temp     By 10:59 AM  Lying LA  124/88   58                    Chrae Malloy CMA 10:59 AM  Sitting   126/86   66                    Chrae Malloy CMA 10:59 AM  Standing  120/84   68                    Chrae Malloy CMA  CC: 1.) Headaches and nosebleeds (bright red), patient also with dizziness  2.) Refill meds    CC:  1.) Headaches and nosebleeds (bright red) and patient also with dizziness  2.) Refill meds .  History of Present Illness: Headaches      This is a 55 year old woman who presents with Headaches since 12/10. Epistaxis 12/11. The patient reports tearing of eyes (unrelated , present for months, ? from makeup as per Ophth) and sinus pain in forehead.She  denies nausea, vomiting, sweats, nasal congestion, photophobia, and phonophobia.  The h.eadache is described as constant and dull.  The location of the pain is frontal across entire forehead  The only high-risk features  include new type of headache and age >50 years.  The patient denies the following high-risk features: fever, neck pain/stiffness, vision loss or change, focal weakness, altered mental status, rash, pain worse with exertion, immunosuppression, concomitant infection, and anticoagulation use.  The headaches  have no triggers.  Prior treatment has included only bedrest, no medication.    Current Medications (verified): 1)  Cartia Xt 180 Mg  Cp24 (Diltiazem Hcl Coated Beads) .Marland Kitchen.. 1 Daily 2)  Crestor 20 Mg  Tabs (Rosuvastatin Calcium) .... Daily  As Directed 3)  Spironolactone 25 Mg  Tabs  (Spironolactone) .Marland Kitchen.. 1 By Mouth Bid 4)  Amoxicillin 500 Mg Caps (Amoxicillin) .... 4 Tabs, 1 Hr Prior To Dental Procedure  Allergies: 1)  ! * Normadyne 2)  ! * Zestorectic 3)  ! Codeine Sulfate (Codeine Sulfate)  Review of Systems General:  Denies chills and sweats. Eyes:  Denies double vision. ENT:  Complains of nasal congestion; denies difficulty swallowing, ear discharge, and ringing in ears; No facial pain or purulence. Derm:  Denies lesion(s). Neuro:  Complains of poor balance; denies brief paralysis, numbness, sensation of room spinning, tingling, and weakness; Imbalance only this am.  Physical Exam  General:  Appears fatigued but well-nourished,in no acute distress; alert,appropriate and cooperative throughout examination Eyes:  No corneal or conjunctival inflammation noted.Sloght scleritis bilaterally. EOMI. Perrla. Field of Vision  & vision grossly normal. Ears:  External ear exam shows no significant lesions or deformities.  Otoscopic examination reveals clear canals, tympanic membranes are intact bilaterally without bulging, retraction, inflammation or discharge. Hearing  is grossly normal bilaterally. Nose:  External nasal examination shows no deformity or inflammation. Nasal mucosa are  dry ; mild erythema R> L  without lesions or exudates. Mouth:  Oral mucosa and oropharynx without lesions or exudates.Missing upper frontal teeth Lungs:  Normal respiratory effort, chest expands symmetrically. Lungs are clear to auscultation, no crackles or wheezes. Heart:  normal rate, regular rhythm, no gallop, no rub, no JVD, no HJR, and grade 1 /6 systolic murmur.   Pulses:  R and L carotid,radial,dorsalis pedis and posterior tibial pulses are full and equal bilaterally Extremities:  1+ left pedal edema and 1/2 + right pedal edema.   Neurologic:  alert & oriented X3, cranial nerves II-XII intact, strength normal in all extremities, sensation intact to light touch, gait normal, DTRs  symmetrical and normal, finger-to-nose normal, and Romberg negative.   Skin:  Intact without suspicious lesions or rashes Cervical Nodes:  No lymphadenopathy noted Axillary Nodes:  No palpable lymphadenopathy Psych:  Oriented X3, flat affect, and subdued.     Impression & Recommendations:  Problem # 1:  HEADACHE (ICD-784.0) R/O frontal sinusitis  Problem # 2:  EPISTAXIS (ICD-784.7) X1  Problem # 3:  DIABETES MELLITUS (ICD-250.00)  Orders: Venipuncture ([address]109) TLB-A1C / Hgb A1C (Glycohemoglobin) (83036-A1C) TLB-Microalbumin/Creat Ratio, Urine (82043-MALB)  Problem # 4:  HYPERTENSION, ESSENTIAL NOS (ICD-401.9) controlled Her updated medication list for this problem includes:    Cartia Xt 180 Mg Cp24 (Diltiazem hcl coated beads) .Marland Kitchen... 1 daily    Spironolactone 25 Mg Tabs (Spironolactone) .Marland Kitchen... 1 by mouth bid  Problem # 5:  CONJUNCTIVITIS (ICD-372.30) ? extrinsic   Complete Medication List: 1)  Cartia Xt 180 Mg Cp24 (Diltiazem hcl coated beads) .Marland Kitchen.. 1 daily 2)  Crestor 20 Mg Tabs (Rosuvastatin calcium) .... Daily  as directed 3)  Spironolactone 25 Mg Tabs (Spironolactone) .Marland Kitchen.. 1 by mouth bid 4)  Amoxicillin 500 Mg Caps (Amoxicillin) .... 4 tabs, 1 hr prior to dental procedure 5)  Smz-tmp Ds 800-160 Mg Tabs (Sulfamethoxazole-trimethoprim) .Marland Kitchen.. 1 two times a day 6)  Fluticasone Propionate 50 Mcg/act Susp (Fluticasone propionate) .Marland Kitchen.. 1 spray two times a day as needed  Patient Instructions: 1)  Neti pot /Rinse once daily as needed for head congestion. 2)  Drink as much fluid as you can tolerate for the next few days. 3)  Take 650-1000mg  of Tylenol every 4-6 hours as needed for relief of pain or comfort of fever AVOID taking more than 4000mg   in a 24 hour period (can cause liver damage in higher doses) OR  4)  take 400-600mg  of Ibuprofen (Advil, Motrin) with food every 4-6 hours as needed for relief of pain or comfort of fever. Eucerin two times a day as needed foir dry nasal  tissues Prescriptions: FLUTICASONE PROPIONATE 50 MCG/ACT SUSP (FLUTICASONE PROPIONATE) 1 spray two times a day as needed  #1 x 5   Entered and Authorized by:   Marga Melnick MD   Signed by:   Marga Melnick MD on 04/24/2010   Method used:   [facility] ...       [facility] [phone][facility] (retail)       [address]       [place], [place]  [address]       Ph: [phone] or [phone]       Fax: [phone]   RxID:   [id number] SMZ-TMP DS 800-160 MG TABS (SULFAMETHOXAZOLE-TRIMETHOPRIM) 1 two times a day  #20  x 0   Entered and Authorized by:   Marga Melnick MD   Signed by:   Marga Melnick MD on 04/24/2010   Method used:   Faxed to ...       [facility] [phone]* (retail)       [address]       [place], [place]  [address]       Ph: [phone] or [phone]       Fax: [phone]   RxID:   [id number] CARTIA XT 180 MG  CP24 (DILTIAZEM HCL COATED BEADS) 1 daily  #90 x 1   Entered and Authorized by:   Marga Melnick MD   Signed by:   Marga Melnick MD on 04/24/2010   Method used:   [facility]ed to ...       [facility] [address] [phone]* (retail)       [address] [place], [place]  [address]       Ph: [phone] or [phone]       Fax: [phone]   RxID:   [id number]    Orders Added: 1)  Est. Patient Level IV [62831] 2)  Venipuncture [51761] 3)  TLB-A1C / Hgb A1C (Glycohemoglobin) [83036-A1C] 4)  TLB-Microalbumin/Creat Ratio, Urine [82043-MALB]  Appended Document: HEADACHES W/NOSEBLEED/RH....

## 2010-06-25 ENCOUNTER — Ambulatory Visit: Payer: BC Managed Care – PPO | Attending: Internal Medicine | Admitting: Physical Therapy

## 2010-07-20 ENCOUNTER — Encounter: Payer: Self-pay | Admitting: Internal Medicine

## 2010-10-11 ENCOUNTER — Ambulatory Visit (INDEPENDENT_AMBULATORY_CARE_PROVIDER_SITE_OTHER): Payer: BC Managed Care – PPO | Admitting: Endocrinology

## 2010-10-11 ENCOUNTER — Other Ambulatory Visit (INDEPENDENT_AMBULATORY_CARE_PROVIDER_SITE_OTHER): Payer: BC Managed Care – PPO

## 2010-10-11 ENCOUNTER — Encounter: Payer: Self-pay | Admitting: Endocrinology

## 2010-10-11 VITALS — BP 128/86 | HR 83 | Temp 98.1°F | Ht 67.5 in | Wt 230.4 lb

## 2010-10-11 DIAGNOSIS — E042 Nontoxic multinodular goiter: Secondary | ICD-10-CM

## 2010-10-11 LAB — TSH: TSH: 0.95 u[IU]/mL (ref 0.35–5.50)

## 2010-10-11 NOTE — Patient Instructions (Addendum)
A thyroid blood test,and an ultrasound, are being ordered for you today.  please call [phone] to hear each of your test results.  You will be prompted to enter the 9-digit "MRN" number that appears at the top left of this page, followed by #.  Then you will hear the message. For the ultrasound, you will be called with a day and time for an appointment. Please return in 1 year.

## 2010-10-11 NOTE — Progress Notes (Signed)
  Subjective:    Patient ID: Kathleen Lucero, female    DOB: [birth year], 55 y.o.   MRN: [id number]  HPI Pt had i-131 rx for hyperthyroidism, due to a multinodular goiter, in 2010.  She has been euthyroid since then.  pt states she feels well in general, except for fatigue. Past Medical History  Diagnosis Date  . Hypertension   . Hyperlipidemia   . Heart murmur   . Goiter   . Uterine fibroid   . GERD (gastroesophageal reflux disease) 2007  . T3 thyrotoxicosis     s/p RAI  . Degenerative disc disease     s/p ruptured disc, Dr. Worthy Rancher    Past Surgical History  Procedure Date  . Tubal ligation   . Wisdom tooth extraction     History   Social History  . Marital Status: Married    Spouse Name: N/A    Number of Children: N/A  . Years of Education: N/A   Occupational History  . Not on file.   Social History Main Topics  . Smoking status: Never Smoker   . Smokeless tobacco: Not on file  . Alcohol Use: No  . Drug Use:   . Sexually Active:    Other Topics Concern  . Not on file   Social History Narrative  . No narrative on file    Current Outpatient Prescriptions on File Prior to Visit  Medication Sig Dispense Refill  . cyclobenzaprine (FLEXERIL) 5 MG tablet Take 5 mg by mouth as directed. 1 bid and 1-2 qhs prn back pain       . diltiazem (CARDIZEM CD) 180 MG 24 hr capsule Take 180 mg by mouth daily.        . fluticasone (FLONASE) 50 MCG/ACT nasal spray Place 1 spray into the nose 2 (two) times daily.       . meloxicam (MOBIC) 7.5 MG tablet Take 7.5 mg by mouth 2 (two) times daily as needed.       . rosuvastatin (CRESTOR) 20 MG tablet Take 20 mg by mouth daily. Daily as directed       . spironolactone (ALDACTONE) 25 MG tablet Take 25 mg by mouth 2 (two) times daily.          Allergies  Allergen Reactions  . Codeine Sulfate     REACTION: nausea    Family History  Problem Relation Age of Onset  . Hypertension Mother   . Stroke Mother   . Hypertension  Brother     brothers  . Rectal cancer Brother   . Hypertension Maternal Grandmother   . Thyroid disease Maternal Grandmother   . Diabetes Maternal Grandmother   . Dementia Maternal Uncle     BP 128/86  Pulse 83  Temp(Src) 98.1 F (36.7 C) (Oral)  Ht 5' 7.5" (1.715 m)  Wt 230 lb 6.4 oz (104.509 kg)  BMI 35.55 kg/m2  SpO2 98%    Review of Systems Denies signif weight change.      Objective:   Physical Exam GENERAL: no distress Neck:  Thyroid is 4x normal size, right > left, with multinodular surface.       Assessment & Plan:  Multinodular goiter, s/p i-131 rx Hyperthyroidism, due to the goiter.  She is at risk for recurrence.

## 2010-10-18 LAB — HM MAMMOGRAPHY: HM Mammogram: NEGATIVE

## 2010-10-24 ENCOUNTER — Telehealth: Payer: Self-pay | Admitting: *Deleted

## 2010-10-24 NOTE — Telephone Encounter (Signed)
Message copied by Carin Primrose on Wed Oct 24, 2010  2:32 PM ------      Message from: Ian Malkin      Created: Tue Oct 23, 2010  4:03 PM                   ----- Message -----         From: Minus Breeding, MD         Sent: 10/11/2010   6:19 PM           To: Jocelyn Lamer            please leave message on phone tree--normal

## 2010-10-24 NOTE — Telephone Encounter (Signed)
Pt unavailable-unable to leave message on home phone #

## 2010-10-25 ENCOUNTER — Ambulatory Visit
Admission: RE | Admit: 2010-10-25 | Discharge: 2010-10-25 | Disposition: A | Discharge status: Home / Self Care | Payer: BC Managed Care – PPO | Source: Ambulatory Visit | Attending: Endocrinology | Admitting: Endocrinology

## 2010-10-25 DIAGNOSIS — E042 Nontoxic multinodular goiter: Secondary | ICD-10-CM

## 2010-10-25 NOTE — Telephone Encounter (Signed)
Left message for pt to callback office on work #

## 2010-10-29 NOTE — Telephone Encounter (Signed)
Pt informed of lab results. 

## 2011-02-19 ENCOUNTER — Other Ambulatory Visit: Payer: Self-pay | Admitting: Internal Medicine

## 2011-02-22 ENCOUNTER — Ambulatory Visit (INDEPENDENT_AMBULATORY_CARE_PROVIDER_SITE_OTHER): Payer: BC Managed Care – PPO | Admitting: Internal Medicine

## 2011-02-22 ENCOUNTER — Encounter: Payer: Self-pay | Admitting: Internal Medicine

## 2011-02-22 DIAGNOSIS — I10 Essential (primary) hypertension: Secondary | ICD-10-CM

## 2011-02-22 DIAGNOSIS — E785 Hyperlipidemia, unspecified: Secondary | ICD-10-CM

## 2011-02-22 DIAGNOSIS — E119 Type 2 diabetes mellitus without complications: Secondary | ICD-10-CM

## 2011-02-22 LAB — HEMOGLOBIN A1C: Hgb A1c MFr Bld: 6.3 % — ABNORMAL HIGH (ref <5.7)

## 2011-02-22 MED ORDER — DILTIAZEM HCL ER COATED BEADS 180 MG PO CP24: 180.0000 mg | ORAL_CAPSULE | Freq: Every day | ORAL | Status: DC

## 2011-02-22 MED ORDER — ROSUVASTATIN CALCIUM 20 MG PO TABS: 20.0000 mg | ORAL_TABLET | ORAL | Status: DC

## 2011-02-22 NOTE — Patient Instructions (Signed)
Eat a low-fat diet with lots of fruits and vegetables, up to 7-9 servings per day. Avoid obesity; your goal is waist measurement < 40 inches.Consume less than 40 grams of sugar per day from foods & drinks with High Fructose Corn Sugar as #1,2,3 or # 4 on label. Follow the low carb nutrition program in [employer] as closely as possible to prevent Diabetes progression & complications. White carbohydrates (potatoes, rice, bread, and pasta) have a high spike of sugar and a high load of sugar. For example a  baked potato has a cup of sugar and a  french fry  2 teaspoons of sugar. Yams, wild  rice, whole grained bread &  wheat pasta have been much lower spike and load of  sugar. Portions should be the size of a deck of cards or your palm.  

## 2011-02-22 NOTE — Progress Notes (Signed)
  Subjective:    Patient ID: Kathleen Lucero, female    DOB: March 23, 1956, 55 y.o.   MRN: [id number]  HPI HYPERTENSION: Disease Monitoring: Blood pressure range-117/78- 126/80  Chest pain, palpitations- no       Dyspnea- no Medications: Compliance- yes except only on Spironolactone 25 mg once daily   Lightheadedness,Syncope- no    Edema- no  DIABETES: NON  Glucose 01/24/2011 : 103; creat 1.11 Disease Monitoring: Blood Sugar ranges-not monitored  Polyuria/phagia/dipsia- no       Visual problems- no; last Ophth exam 2012: no retinopathy Diet: no plan Medications: Compliance- no meds    HYPERLIPIDEMIA:LDL 100; HDL 52; TG 82 Medications: Compliance- yes; Crestor 20 mg 1/2 M, W, F & Sun  Abd pain, bowel changes- no   Muscle aches- no    ROS See HPI above   PMH Smoking Status noted       Review of Systems     Objective:   Physical Exam Gen.: Healthy  & well-nourished, appropriate and alert, weight Eyes: No lid/conjunctival changes, extraocular motion intact, fundi benign Neck: Normal range of motion, thyroid Respiratory: No increased work of breathing or abnormal breath sounds Cardiac : regular rhythm, no extra heart sounds, gallop. Grade 1/6 systolic  murmur Lymph: No lymphadenopathy of the neck or axilla Skin: No rashes, lesions, ulcers or ischemic changes Muscle skeletal: no nail changes; joints normal Vasc:All pulses intact, no bruits present Neuro: Normal deep tendon reflexes, alert & oriented, sensation over feet normal Psych: judgment and insight, mood and affect normal          Assessment & Plan:  #1 hypertension controlled with present regimen  #2 hyperglycemia; questionable diabetic status  #3 dyslipidemia, lipids at goal  #4 minimal elevation of creatinine  #5 past medical history of hyperthyroidism; TSH is therapeutic  Plan: A1c and urine microalbumin. Continue Spironolactone once daily. Encourage good hydration.

## 2011-02-22 NOTE — Progress Notes (Signed)
Addended by: Legrand Como on: 02/22/2011 04:02 PM   Modules accepted: Orders

## 2011-02-23 LAB — MICROALBUMIN / CREATININE URINE RATIO: Creatinine, Urine: 204.5 mg/dL

## 2011-02-25 ENCOUNTER — Telehealth: Payer: Self-pay

## 2011-02-25 MED ORDER — AMOXICILLIN 500 MG PO CAPS: ORAL_CAPSULE | ORAL | Status: DC

## 2011-02-25 NOTE — Telephone Encounter (Signed)
Message copied by Edgardo Roys on Mon Feb 25, 2011  5:14 PM ------      Message from: Pecola Lawless      Created: Sun Feb 24, 2011  5:24 PM       Please call Rx for Amoxicillin 500 mg # 24  to pharmacy. Four pills 1 hr pre  Dental work

## 2011-05-04 ENCOUNTER — Other Ambulatory Visit: Payer: Self-pay | Admitting: Internal Medicine

## 2011-06-14 ENCOUNTER — Telehealth: Payer: Self-pay | Admitting: *Deleted

## 2011-06-14 DIAGNOSIS — E785 Hyperlipidemia, unspecified: Secondary | ICD-10-CM

## 2011-06-14 MED ORDER — ROSUVASTATIN CALCIUM 20 MG PO TABS: 20.0000 mg | ORAL_TABLET | ORAL | Status: DC

## 2011-06-14 NOTE — Telephone Encounter (Signed)
Left Pt detail message that Rx sent in to pharmacy and to return call to office with any further concerns.

## 2011-06-14 NOTE — Telephone Encounter (Signed)
Office Message from Date: 06/14/2011 12:00:00 AM Time of Call: [phone] Faxed To: [facility] (Daytime Triage) Caller: Jiovanna Fax Number: [phone] Facility: N/A Patient: Kathleen Lucero, Kathleen Lucero DOB: 1955-07-08 Phone: [phone] Provider: Marga Melnick Message: Patient out of Crestor and states Dr Alwyn Ren gave her samples the last time but she uses a mail order pharmacy and is wanting to know if a Rx can be called into [facility] [address] for a 30 days supply until mail order Rx can come in.

## 2011-06-14 NOTE — Telephone Encounter (Signed)
Pt left VM that she would like call back did not leave any detail what call was in reference to..Left message to call office

## 2011-07-08 ENCOUNTER — Encounter: Payer: Self-pay | Admitting: Internal Medicine

## 2011-07-08 ENCOUNTER — Ambulatory Visit (INDEPENDENT_AMBULATORY_CARE_PROVIDER_SITE_OTHER): Payer: BC Managed Care – PPO | Admitting: Internal Medicine

## 2011-07-08 DIAGNOSIS — R509 Fever, unspecified: Secondary | ICD-10-CM

## 2011-07-08 DIAGNOSIS — R52 Pain, unspecified: Secondary | ICD-10-CM

## 2011-07-08 LAB — POCT INFLUENZA A/B: Influenza B, POC: NEGATIVE

## 2011-07-08 NOTE — Patient Instructions (Signed)
Zicam Melts or Zinc lozenges ; vitamin C 2000 mg daily; & Echinacea for 4-7 days. Report persistent  fever, exudate("pus") or progressive pain. NSAIDS ( Aleve, Advil, Naproxen) or Tylenol every 4 hrs as needed for fever as discussed based on label recommendations Plain Mucinex for thick secretions ;force NON dairy fluids for next 48 hrs. Use a Neti pot daily as needed for sinus congestion. Nasal cleansing in the shower as discussed. Make sure that all residual soap is removed to prevent irritation Dymista sample nasal spray as 1 spray in each nostril twice a day as needed. Use the "crossover" technique as discussed

## 2011-07-08 NOTE — Progress Notes (Signed)
  Subjective:    Patient ID: Kathleen Lucero, female    DOB: 1955-09-21, 56 y.o.   MRN: [id number]  HPI Respiratory tract infection Onset/symptoms:07/05/11 as sneezing, rhinitis Exposures (illness/environmental/extrinsic):no Progression of symptoms:to myalgias Treatments/response:Tylenol Cold & Flu, Robitussin  w/o benefit Present symptoms: Fever/chills/sweats:yes Frontal headache:yes Facial pain:no Nasal purulence:clear Sore throat:scratchy Dental pain:no Lymphadenopathy:no Wheezing/shortness of breath:no Cough/sputum/hemoptysis:clear sputum Associated extrinsic/allergic symptoms:itchy eyes/ sneezing:yes Past medical history: Seasonal allergies: no/asthma:no Smoking history:never  She did  not take the flu shot           Review of Systems     Objective:   Physical Exam General appearance:good health ;well nourished; no acute distress or increased work of breathing is present but appears fatigued. No  lymphadenopathy about the head, neck, or axilla noted.   Eyes: No conjunctival inflammation or lid edema is present; but  scleral vessels prominent.  Ears:  External ear exam shows no significant lesions or deformities.  Otoscopic examination reveals clear canals, tympanic membranes are intact bilaterally without bulging, retraction, inflammation or discharge.  Nose:  External nasal examination shows no deformity or inflammation. Nasal mucosa are erythematous  without lesions or exudates. No septal dislocation or deviation.No obstruction to airflow.   Oral exam: Dental hygiene is good; lips and gums are healthy appearing.There is minimal oropharyngeal erythema ;no exudate noted.     Heart:  Normal rate and regular rhythm. S1 and S2 normal without gallop,  click, rub or other extra sounds. Grade 1.5 /6 systolic murmur   Lungs:Chest clear to auscultation; no wheezes, rhonchi,rales ,or rubs present.No increased work of breathing.    Extremities:  No cyanosis, edema,  or clubbing  noted    Skin: Warm & dry           Assessment & Plan:  #1URI w/o purulence; she has some extrinsic symptoms with sneezing. She has low-grade fever. Nasal swab for flu is -3 days after onset of symptoms suggesting that this is not a false negative   Plan: See orders&  recommendations

## 2011-07-10 ENCOUNTER — Telehealth: Payer: Self-pay

## 2011-07-10 NOTE — Telephone Encounter (Signed)
Chrea can you help with this, please?

## 2011-07-10 NOTE — Telephone Encounter (Signed)
Please ask orthopedics if she can be worked in the next 24 hours as  she is not getting better despite narcotic pain medicines.

## 2011-07-10 NOTE — Telephone Encounter (Signed)
Patient called stating that she saw you on Monday and she is not feeling any better today.  She has not returned to work and would like to get a work release.  How long do you want her out of work and if you need to change her medications, since she isn't feeling any better.

## 2011-07-10 NOTE — Telephone Encounter (Signed)
Out of work note for 2/25- 27; she needs to be seen 2/28 if she is still sick. She may need blood counts or chest x-ray.

## 2011-07-10 NOTE — Telephone Encounter (Signed)
Need Orthopaedic referral entered please, and diagnosis.

## 2011-07-10 NOTE — Telephone Encounter (Signed)
Please readvise. 

## 2011-07-10 NOTE — Telephone Encounter (Signed)
Spoke with patient.  She states that she is still not feeling well.  She wants her work excuse faxed to [phone] Attn: April Randeman.  She was instructed to call in the morning to be seen by one of the other physicians if she isn't feeling any better.

## 2011-07-12 ENCOUNTER — Telehealth: Payer: Self-pay | Admitting: *Deleted

## 2011-07-12 NOTE — Telephone Encounter (Signed)
Office Message [address] [place], [place] [address] p. [phone] f. [phone] To: [facility] (Daytime Triage) Fax: [phone] From: [facility] Date/ Time: 07/12/2011 5:04 PM Taken By: Mercer Pod, CSR Caller: Eber Jones Facility: not collected Patient: Kathleen, Lucero DOB: [birth year] Phone: [phone] Reason for Call: Needs note for being off work 2/25-2/27. Please fax [phone] Regarding Appointment: Appt Date: Appt Time: Unknown Provider: Reason: Details: Outcome:

## 2011-07-12 NOTE — Telephone Encounter (Signed)
Ok to give note 

## 2011-07-15 ENCOUNTER — Telehealth: Payer: Self-pay

## 2011-07-15 NOTE — Telephone Encounter (Signed)
To: [facility] (Daytime Triage) Fax: [phone] From: [facility] Date/ Time: 07/12/2011 5:04 PM Taken By: Mercer Pod, CSR Caller: Eber Jones Facility: not collected Patient: Schwanda, Zima DOB: September 02, 1955 Phone: [phone] Reason for Call: Needs note for being off work 2/25-2/27. Please fax [phone] Regarding Appointment: Appt Date: Appt Time: Unknown Provider: Reason: Details: Outcome:

## 2011-07-15 NOTE — Telephone Encounter (Signed)
Letter from 2/27 has been re-faxed     KP

## 2011-07-16 NOTE — Telephone Encounter (Signed)
DUPLICATE MESSAGE

## 2011-08-12 ENCOUNTER — Encounter: Payer: BC Managed Care – PPO | Admitting: Internal Medicine

## 2011-09-27 ENCOUNTER — Other Ambulatory Visit: Payer: Self-pay | Admitting: Internal Medicine

## 2011-09-27 NOTE — Telephone Encounter (Signed)
Patient over-due to have cholesterol checked. Lipid/Hep 272.4/995.20

## 2011-10-04 ENCOUNTER — Ambulatory Visit (INDEPENDENT_AMBULATORY_CARE_PROVIDER_SITE_OTHER): Payer: BC Managed Care – PPO | Admitting: Internal Medicine

## 2011-10-04 ENCOUNTER — Encounter: Payer: Self-pay | Admitting: Internal Medicine

## 2011-10-04 VITALS — BP 124/82 | HR 69 | Temp 98.3°F | Ht 67.0 in | Wt 226.4 lb

## 2011-10-04 DIAGNOSIS — E785 Hyperlipidemia, unspecified: Secondary | ICD-10-CM

## 2011-10-04 DIAGNOSIS — Z0000 Encounter for general adult medical examination without abnormal findings: Secondary | ICD-10-CM

## 2011-10-04 DIAGNOSIS — R9431 Abnormal electrocardiogram [ECG] [EKG]: Secondary | ICD-10-CM | POA: Insufficient documentation

## 2011-10-04 LAB — CBC WITH DIFFERENTIAL/PLATELET
Basophils Relative: 0.7 % (ref 0.0–3.0)
Eosinophils Absolute: 0.2 10*3/uL (ref 0.0–0.7)
HCT: 43.1 % (ref 36.0–46.0)
Hemoglobin: 13.9 g/dL (ref 12.0–15.0)
Lymphocytes Relative: 34 % (ref 12.0–46.0)
Lymphs Abs: 2 10*3/uL (ref 0.7–4.0)
MCHC: 32.3 g/dL (ref 30.0–36.0)
Monocytes Relative: 7.1 % (ref 3.0–12.0)
Neutro Abs: 3.3 10*3/uL (ref 1.4–7.7)
RBC: 4.99 Mil/uL (ref 3.87–5.11)

## 2011-10-04 LAB — HEPATIC FUNCTION PANEL
ALT: 21 U/L (ref 0–35)
Albumin: 4.4 g/dL (ref 3.5–5.2)
Alkaline Phosphatase: 85 U/L (ref 39–117)
Bilirubin, Direct: 0 mg/dL (ref 0.0–0.3)
Total Protein: 8.2 g/dL (ref 6.0–8.3)

## 2011-10-04 LAB — BASIC METABOLIC PANEL
CO2: 26 mEq/L (ref 19–32)
Calcium: 9.9 mg/dL (ref 8.4–10.5)
Glucose, Bld: 93 mg/dL (ref 70–99)
Potassium: 3.5 mEq/L (ref 3.5–5.1)
Sodium: 140 mEq/L (ref 135–145)

## 2011-10-04 LAB — LIPID PANEL: Total CHOL/HDL Ratio: 4

## 2011-10-04 LAB — TSH: TSH: 1.84 u[IU]/mL (ref 0.35–5.50)

## 2011-10-04 MED ORDER — ROSUVASTATIN CALCIUM 20 MG PO TABS: ORAL_TABLET | ORAL | Status: DC

## 2011-10-04 NOTE — Progress Notes (Signed)
  Subjective:    Patient ID: Kathleen Lucero, female    DOB: 08-Jun-1955, 56 y.o.   MRN: [id number]  HPI  She is  is here for a physical; she has no acute health  issues .      Review of Systems HYPERTENSION: Disease Monitoring: Blood pressure range-117-118/< 80 Chest pain, palpitations-no       Dyspnea- no Medications: Compliance- yes  Lightheadedness,Syncope- no    Edema- no  FASTING HYPERGLYCEMIA, PMH of:  Disease Monitoring: Blood Sugar ranges-not monitored  Polyuria/phagia/dipsia- no       Visual problems- no.She sees Dr. Elmer Picker annually  Diet:Dr Oz modified heart healthy   HYPERLIPIDEMIA: Disease Monitoring: See symptoms for Hypertension Medications: Compliance- yes  Abd pain, bowel changes- no; some bloating with increased fiber . Last Gyn exam 8/12  Muscle aches- no       Objective:   Physical Exam Gen.:  well-nourished in appearance. Alert, appropriate and cooperative throughout exam. Head: Normocephalic without obvious abnormalities  Eyes: No corneal or conjunctival inflammation noted. Pupils equal round reactive to light and accommodation. Fundal exam is benign without hemorrhages, exudate, papilledema. Extraocular motion intact. Vision grossly normal. Ears: External  ear exam reveals no significant lesions or deformities. Canals clear .TMs normal. Hearing is grossly decreased on L Nose: External nasal exam reveals no deformity or inflammation. Nasal mucosa are pink and moist. No lesions or exudates noted.   Mouth: Oral mucosa and oropharynx reveal no lesions or exudates. Carious RU tooth. Note: dental work in progress. Neck: No deformities, masses, or tenderness noted. Range of motion normal Thyroid : no goiter. Lungs: Normal respiratory effort; chest expands symmetrically. Lungs are clear to auscultation without rales, wheezes, or increased work of breathing. Heart: Normal rate and rhythm. Normal S1 and S2. No gallop, click, or rub. Grade 1/6 systolic  murmur R base Abdomen: Bowel sounds normal; abdomen soft and nontender. No masses, organomegaly or hernias noted. Genitalia: Dr Hyacinth Meeker, Gyn                                          Musculoskeletal/extremities: No deformity or scoliosis noted of  the thoracic or lumbar spine. No clubbing, cyanosis, edema, or deformity noted. Range of motion  normal .Tone & strength  normal.Joints normal. Nail health  Good.Crepitus of knees w/o effusion Vascular: Carotid, radial artery, dorsalis pedis and  posterior tibial pulses are full and equal. No bruits present. Neurologic: Alert and oriented x3. Deep tendon reflexes symmetrical and normal.          Skin: Intact without suspicious lesions or rashes. Lymph: No cervical, axillary lymphadenopathy present. Psych: Mood and affect are normal. Normally interactive                                                                                         Assessment & Plan:  #1 comprehensive physical exam; no acute findings #2 see Problem List with Assessments & Recommendations Plan: see Orders

## 2011-10-04 NOTE — Patient Instructions (Signed)
Preventive Health Care: Exercise  30-45  minutes a day, 3-4 days a week. Walking is especially valuable in preventing Osteoporosis. Eat a low-fat diet with lots of fruits and vegetables, up to 7-9 servings per day. Consume less than 30 grams of sugar per day from foods & drinks with High Fructose Corn Syrup as # 1,2,3 or #4 on label. Eye Doctor - have an eye exam @ least annually Health Care Power of Attorney & Living Will place you in charge of your health care  decisions. Verify these are  in place. Blood Pressure Goal  Ideally is an AVERAGE < 135/85. This AVERAGE should be calculated from @ least 5-7 BP readings taken @ different times of day on different days of week. You should not respond to isolated BP readings , but rather the AVERAGE for that week. Please try to go on My Chart within the next 24 hours to allow me to release the results directly to you.

## 2011-10-18 ENCOUNTER — Other Ambulatory Visit: Payer: Self-pay | Admitting: Internal Medicine

## 2011-12-26 ENCOUNTER — Other Ambulatory Visit: Payer: Self-pay | Admitting: Internal Medicine

## 2012-01-17 ENCOUNTER — Ambulatory Visit: Payer: BC Managed Care – PPO | Admitting: Endocrinology

## 2012-01-17 DIAGNOSIS — Z0289 Encounter for other administrative examinations: Secondary | ICD-10-CM

## 2012-04-28 ENCOUNTER — Encounter: Payer: Self-pay | Admitting: Internal Medicine

## 2012-04-28 ENCOUNTER — Ambulatory Visit (INDEPENDENT_AMBULATORY_CARE_PROVIDER_SITE_OTHER): Payer: BC Managed Care – PPO | Admitting: Internal Medicine

## 2012-04-28 VITALS — BP 140/78 | HR 60 | Wt 231.6 lb

## 2012-04-28 DIAGNOSIS — I10 Essential (primary) hypertension: Secondary | ICD-10-CM

## 2012-04-28 DIAGNOSIS — E0590 Thyrotoxicosis, unspecified without thyrotoxic crisis or storm: Secondary | ICD-10-CM

## 2012-04-28 DIAGNOSIS — R7309 Other abnormal glucose: Secondary | ICD-10-CM

## 2012-04-28 DIAGNOSIS — E785 Hyperlipidemia, unspecified: Secondary | ICD-10-CM

## 2012-04-28 NOTE — Assessment & Plan Note (Signed)
Because of diet changes with extensive travel; A1c will need to be rechecked.

## 2012-04-28 NOTE — Assessment & Plan Note (Signed)
Home blood pressure measurement recordings are excellent. Renal function is normal. Potassium was low normal on the spironolactone

## 2012-04-28 NOTE — Progress Notes (Signed)
  Subjective:    Patient ID: Kathleen Lucero, female    DOB: [birth year], 56 y.o.   MRN: [id number]  HPI The patient is here for followup of fasting hyperglycemia, hyperlipidemia, and hypertension.  The most recent A1c 5/24  Was 6.1 %  , which correlates to an average sugar of 129 , and long-term risk of 32 % . Fasting blood sugar not checked   .  No hypoglycemia Last ophthalmologic examination 2013 in 6/13  revealed no retinopathy. No active podiatry assessment on record. Diet is non specific ; she travels  . Exercise is walking through airports .  The most recent lipids 10/04/11  reveal LDL 123  , HDL 49.1 , and triglycerides 97 . There is medical compliance with the statin.  Blood pressure range  or average is 120-130/68-72  . There is medical compliance with antihypertensive medications         Review of Systems Constitutional: No fever, chills, significant weight change, fatigue, weakness or night sweats Eyes: No  blurred vision, double vision, or loss of vision Cardiovascular: no chest pain, palpitations, racing, irregular rhythm,syncope,nausea,sweating, claudication, or edema  Respiratory: No exertinal dyspnea, paroxysmal nocturnal dyspnea Gastrointestinal: No heartburn,dysphagia, diarrhea, significant constipation, rectal bleeding, melena Genitourinary: No dysuria,hematuria, pyuria, frequency,  incontinence, nocturia Musculoskeletal: No myalgias or muscle cramping , weakness, or cyanosis Dermatologic: No rash, pruritus, urticaria, or change in color or temperature of skin Neurologic: No  limb weakness,  numbness or tingling Endocrine: No change in hair/skin/ nails, excessive thirst, excessive hunger, excessive urination, or unexplained fatigue.  Her major issue is job-related stress. As noted she travels extensively     Objective:   Physical Exam Gen.:  well-nourished in appearance. Alert, appropriate and cooperative throughout exam.  Eyes: No corneal or  conjunctival inflammation noted.  Neck: No deformities, masses, or tenderness noted. Range of motion normal. Thyroid : physiologic asymmetry. Lungs: Normal respiratory effort; chest expands symmetrically. Lungs are clear to auscultation without rales, wheezes, or increased work of breathing. Heart: Normal rate and rhythm. Normal S1 and S2. No gallop, click, or rub. Grade 1/6 systolic murmur . Abdomen: Bowel sounds normal; abdomen soft and nontender. No masses, organomegaly or hernias noted.  Musculoskeletal/extremities:  No clubbing, cyanosis, edema, or deformity noted. Range of motion  normal .Tone & strength  normal.Joints normal. Nail health  Good.Slight valgus knee changes. Pes planus Vascular: Carotid, radial artery, dorsalis pedis and  posterior tibial pulses are full and equal. No bruits present. Neurologic: Alert and oriented x3. Deep tendon reflexes symmetrical and normal.          Skin: Intact without suspicious lesions or rashes. Lymph: No cervical, axillary lymphadenopathy present. Psych: Mood and affect are normal. Normally interactive                                                                                         Assessment & Plan:

## 2012-04-28 NOTE — Assessment & Plan Note (Signed)
Advanced cholesterol test reviewed and goals discussed. Diet changes related travel necessitate recheck of the fasting lipids

## 2012-04-28 NOTE — Patient Instructions (Addendum)
Please  schedule fasting Labs : BMET,Lipids, hepatic panel,  TSH, A1c , urine microalbumin. PLEASE BRING THESE INSTRUCTIONS TO FOLLOW UP  LAB APPOINTMENT.This will guarantee correct labs are drawn, eliminating need for repeat blood sampling ( needle sticks ! ). Diagnoses /Codes: 272.4, 401.9,790.29.  If you activate My Chart; the results can be released to you as soon as they populate from the lab. If you choose not to use this program; the labs have to be reviewed, copied & mailed   causing a delay in getting the results to you.

## 2012-05-01 ENCOUNTER — Other Ambulatory Visit: Payer: BC Managed Care – PPO

## 2012-05-04 ENCOUNTER — Other Ambulatory Visit (INDEPENDENT_AMBULATORY_CARE_PROVIDER_SITE_OTHER): Payer: BC Managed Care – PPO

## 2012-05-04 DIAGNOSIS — I10 Essential (primary) hypertension: Secondary | ICD-10-CM

## 2012-05-04 DIAGNOSIS — R7309 Other abnormal glucose: Secondary | ICD-10-CM

## 2012-05-04 DIAGNOSIS — E785 Hyperlipidemia, unspecified: Secondary | ICD-10-CM

## 2012-05-04 LAB — BASIC METABOLIC PANEL
Chloride: 106 mEq/L (ref 96–112)
Potassium: 3.7 mEq/L (ref 3.5–5.1)

## 2012-05-04 LAB — MICROALBUMIN / CREATININE URINE RATIO: Microalb, Ur: 0.8 mg/dL (ref 0.0–1.9)

## 2012-05-04 LAB — LIPID PANEL
Cholesterol: 187 mg/dL (ref 0–200)
HDL: 46.7 mg/dL (ref >39.00)
LDL Cholesterol: 124 mg/dL — ABNORMAL HIGH (ref 0–99)
Triglycerides: 84 mg/dL (ref 0.0–149.0)
VLDL: 16.8 mg/dL (ref 0.0–40.0)

## 2012-05-04 LAB — HEPATIC FUNCTION PANEL
Albumin: 4.2 g/dL (ref 3.5–5.2)
Total Protein: 7.6 g/dL (ref 6.0–8.3)

## 2012-05-04 LAB — HEMOGLOBIN A1C: Hgb A1c MFr Bld: 6.5 % (ref 4.6–6.5)

## 2012-05-04 LAB — TSH: TSH: 1.28 u[IU]/mL (ref 0.35–5.50)

## 2012-05-27 ENCOUNTER — Other Ambulatory Visit: Payer: Self-pay | Admitting: Internal Medicine

## 2012-09-21 ENCOUNTER — Other Ambulatory Visit: Payer: Self-pay | Admitting: Internal Medicine

## 2012-10-03 ENCOUNTER — Emergency Department (HOSPITAL_COMMUNITY): Payer: BC Managed Care – PPO

## 2012-10-03 ENCOUNTER — Encounter (HOSPITAL_COMMUNITY): Payer: Self-pay | Admitting: Family Medicine

## 2012-10-03 ENCOUNTER — Emergency Department (HOSPITAL_COMMUNITY)
Admission: EM | Admit: 2012-10-03 | Discharge: 2012-10-03 | Disposition: A | Discharge status: Home / Self Care | Payer: BC Managed Care – PPO | Attending: Emergency Medicine | Admitting: Emergency Medicine

## 2012-10-03 DIAGNOSIS — Z79899 Other long term (current) drug therapy: Secondary | ICD-10-CM | POA: Insufficient documentation

## 2012-10-03 DIAGNOSIS — Z8742 Personal history of other diseases of the female genital tract: Secondary | ICD-10-CM | POA: Insufficient documentation

## 2012-10-03 DIAGNOSIS — Z8639 Personal history of other endocrine, nutritional and metabolic disease: Secondary | ICD-10-CM | POA: Insufficient documentation

## 2012-10-03 DIAGNOSIS — Z862 Personal history of diseases of the blood and blood-forming organs and certain disorders involving the immune mechanism: Secondary | ICD-10-CM | POA: Insufficient documentation

## 2012-10-03 DIAGNOSIS — Z8739 Personal history of other diseases of the musculoskeletal system and connective tissue: Secondary | ICD-10-CM | POA: Insufficient documentation

## 2012-10-03 DIAGNOSIS — I10 Essential (primary) hypertension: Secondary | ICD-10-CM | POA: Insufficient documentation

## 2012-10-03 DIAGNOSIS — E785 Hyperlipidemia, unspecified: Secondary | ICD-10-CM | POA: Insufficient documentation

## 2012-10-03 DIAGNOSIS — Y9229 Other specified public building as the place of occurrence of the external cause: Secondary | ICD-10-CM | POA: Insufficient documentation

## 2012-10-03 DIAGNOSIS — R011 Cardiac murmur, unspecified: Secondary | ICD-10-CM | POA: Insufficient documentation

## 2012-10-03 DIAGNOSIS — Y939 Activity, unspecified: Secondary | ICD-10-CM | POA: Insufficient documentation

## 2012-10-03 DIAGNOSIS — W208XXA Other cause of strike by thrown, projected or falling object, initial encounter: Secondary | ICD-10-CM | POA: Insufficient documentation

## 2012-10-03 DIAGNOSIS — S0990XA Unspecified injury of head, initial encounter: Secondary | ICD-10-CM | POA: Insufficient documentation

## 2012-10-03 DIAGNOSIS — Z8719 Personal history of other diseases of the digestive system: Secondary | ICD-10-CM | POA: Insufficient documentation

## 2012-10-03 MED ORDER — TRAMADOL HCL 50 MG PO TABS: 50.0000 mg | ORAL_TABLET | Freq: Four times a day (QID) | ORAL | Status: DC | PRN

## 2012-10-03 MED ORDER — ACETAMINOPHEN 325 MG PO TABS
650.0000 mg | ORAL_TABLET | Freq: Once | ORAL | Status: AC
Start: 2012-10-03 — End: 2012-10-03
  Administered 2012-10-03: 650 mg via ORAL
  Filled 2012-10-03: qty 2

## 2012-10-03 NOTE — ED Notes (Signed)
Patient states that she was cleaning her garage and was struck in the head by a concrete urn.  Injury occurred today at 1030 AM and she was struck on the left side of her head.  Denies LOC.

## 2012-10-03 NOTE — ED Notes (Signed)
Entries made at 1322 and 1323 are incorrect.

## 2012-10-03 NOTE — ED Provider Notes (Signed)
History     CSN: [id number]  Arrival date & time 10/03/12  1050   First MD Initiated Contact with Patient 10/03/12 1137      Chief Complaint  Patient presents with  . Head Injury     HPI Patient was in her crotch went to [place] trees fell on her head.  The remainder cement.  She did not have loss of consciousness but does have a bad headache.  She is currently taking no blood thinners but does take blood pressure medicine.  Denies neck pain or weakness. Past Medical History  Diagnosis Date  . Hypertension   . Hyperlipidemia   . Heart murmur   . Goiter   . Uterine fibroid     Dr Hyacinth Meeker  . GERD (gastroesophageal reflux disease) 2007  . T3 thyrotoxicosis 2011    S/P RAI  . Degenerative disc disease     S/P ruptured disc, Dr. Worthy Rancher    Past Surgical History  Procedure Laterality Date  . Tubal ligation    . Wisdom tooth extraction    . Colonoscopy with polypectomy  2011    Dr Loreta Ave; due 2014    Family History  Problem Relation Age of Onset  . Hypertension Mother   . Stroke Mother 35    5 days post partum  . Hypertension Brother      2 brothers  . Cancer Brother 67    colorectal  . Hypertension Maternal Grandmother   . Thyroid disease Maternal Grandmother     hypothyroidism  . Diabetes Maternal Grandmother     diet controlled  . Dementia Maternal Grandmother 48    History  Substance Use Topics  . Smoking status: Never Smoker   . Smokeless tobacco: Not on file  . Alcohol Use: No    OB History   Grav Para Term Preterm Abortions TAB SAB Ect Mult Living   4 3              Review of Systems All other systems reviewed and are negative Allergies  Codeine sulfate  Home Medications   Current Outpatient Rx  Name  Route  Sig  Dispense  Refill  . diltiazem (TIAZAC) 180 MG 24 hr capsule   Oral   Take 180 mg by mouth daily.         . rosuvastatin (CRESTOR) 20 MG tablet   Oral   Take 10 mg by mouth daily. Takes 10 mg on Monday, Wed,  Friday and Sunday.  20 mg all other days.         Marland Kitchen spironolactone (ALDACTONE) 25 MG tablet   Oral   Take 25 mg by mouth 2 (two) times daily.         . traMADol (ULTRAM) 50 MG tablet   Oral   Take 1 tablet (50 mg total) by mouth every 6 (six) hours as needed for pain.   15 tablet   0     BP 134/72  Pulse 72  Temp(Src) 98.3 F (36.8 C) (Oral)  Resp 20  Ht 5' 8.5" (1.74 m)  Wt 220 lb (99.791 kg)  BMI 32.96 kg/m2  SpO2 100%  Physical Exam  Nursing note and vitals reviewed. Constitutional: She is oriented to person, place, and time. She appears well-developed and well-nourished. No distress.  HENT:  Head: Normocephalic.    Eyes: Pupils are equal, round, and reactive to light.  Neck: Normal range of motion.  Cardiovascular: Normal rate and intact distal pulses.  Pulmonary/Chest: No respiratory distress.  Abdominal: Normal appearance. She exhibits no distension.  Musculoskeletal: Normal range of motion.  Neurological: She is alert and oriented to person, place, and time. She has normal strength. No cranial nerve deficit. Gait normal.  Skin: Skin is warm and dry. No rash noted.  Psychiatric: She has a normal mood and affect. Her behavior is normal.    ED Course  Procedures (including critical care time)  Labs Reviewed - No data to display Ct Head Wo Contrast  10/03/2012   *RADIOLOGY REPORT*  Clinical Data: [place] Seat.  Hit head.  CT HEAD WITHOUT CONTRAST  Technique:  Contiguous axial images were obtained from the base of the skull through the vertex without contrast.  Comparison: None.  Findings: The ventricles are normal.  No extra-axial fluid collections are seen.  The brainstem and cerebellum are unremarkable.  No acute intracranial findings such as infarction or hemorrhage.  No mass lesions.  The bony calvarium is intact.  The visualized paranasal sinuses and mastoid air cells are clear.  IMPRESSION: No acute intracranial findings or skull fracture.   Original Report  Authenticated By: Rudie Meyer, M.D.     1. Head injury, initial encounter       MDM          Nelia Shi, MD 10/03/12 [phone]

## 2012-10-03 NOTE — ED Notes (Signed)
Per pt sts she was in the garage and 2 cement christmas decorations hit her head. Denies LOC. sts pain in that area. No bleeding noted.

## 2012-11-09 ENCOUNTER — Telehealth: Payer: Self-pay | Admitting: Internal Medicine

## 2012-11-09 NOTE — Telephone Encounter (Signed)
Left Pt VM samples ready for pickup.

## 2012-11-09 NOTE — Telephone Encounter (Signed)
4 packs if available

## 2012-11-09 NOTE — Telephone Encounter (Signed)
Patient wants to know if there are any Crestor samples available. Please advise.

## 2012-11-17 ENCOUNTER — Encounter: Payer: Self-pay | Admitting: Internal Medicine

## 2012-11-17 ENCOUNTER — Ambulatory Visit (INDEPENDENT_AMBULATORY_CARE_PROVIDER_SITE_OTHER): Payer: BC Managed Care – PPO | Admitting: Internal Medicine

## 2012-11-17 VITALS — BP 126/84 | HR 70 | Wt 235.0 lb

## 2012-11-17 DIAGNOSIS — E785 Hyperlipidemia, unspecified: Secondary | ICD-10-CM

## 2012-11-17 DIAGNOSIS — R7309 Other abnormal glucose: Secondary | ICD-10-CM

## 2012-11-17 DIAGNOSIS — E039 Hypothyroidism, unspecified: Secondary | ICD-10-CM

## 2012-11-17 DIAGNOSIS — I10 Essential (primary) hypertension: Secondary | ICD-10-CM

## 2012-11-17 MED ORDER — FREESTYLE LANCETS MISC: Status: DC

## 2012-11-17 MED ORDER — GLUCOSE BLOOD VI STRP: ORAL_STRIP | Status: DC

## 2012-11-17 MED ORDER — DILTIAZEM HCL ER BEADS 180 MG PO CP24: 180.0000 mg | ORAL_CAPSULE | Freq: Every day | ORAL | Status: DC

## 2012-11-17 MED ORDER — SPIRONOLACTONE 25 MG PO TABS: 25.0000 mg | ORAL_TABLET | Freq: Two times a day (BID) | ORAL | Status: DC

## 2012-11-17 NOTE — Assessment & Plan Note (Signed)
A1c is at the diabetes cut off. This will be rechecked with urine microalbumin. Glucometer teaching will be initiated. Nutrition & exercise interventions encouraged

## 2012-11-17 NOTE — Progress Notes (Signed)
Subjective:    Patient ID: Kathleen Lucero, female    DOB: July 09, 1955, 57 y.o.   MRN: [id number]  HPI The patient is here for followup of diabetes risk; hyperlipidemia and hypertension. F/U labs requested in 4/14 never completed.  The most recent A1c was 6.5% , which correlates to an average sugar of 140 , and long-term risk of 30% . Fasting blood sugar range or average not monitored .  Highest two-hour postprandial glucose not monitored . No hypoglycemia reported Last ophthalmologic examination December 2013  revealed no retinopathy. No active podiatry assessment on record. Does not follow a low carb or heart healthy diet. Does not exercise.  The most recent lipids   reveal LDL- 124 ; HDL- 46.7 ; and triglycerides- 84   . There is medical compliance with the statin. No subjective myalgias arthralgias  Blood pressure average: 117/78  . There is medical good compliance with antihypertensive medications  No  medication adverse effects.        Review of Systems Constitutional: No  significant weight change;  excess fatigue Eyes: No  blurred vision;  double vision ; loss of vision Cardiovascular: no chest pain ;palpitations; racing; irregular rhythm ;syncope; claudication ; edema  Respiratory: No exertional dyspnea;  paroxysmal nocturnal dyspnea Genitourinary: No dysuria; hematuria ; pyuria; frequency;  Incontinence;  nocturia Musculoskeletal: No myalgias or muscle cramping  Dermatologic: No non healing  Lesions; change in color or temperature of skin Neurologic: No  limb weakness;  numbness or tingling;  burning Endocrine: No change in hair, skin, nails. No excessive thirst; excessive hunger;  excessive urination      Objective:   Physical Exam Gen.:  well-nourished in appearance. Alert, appropriate and cooperative throughout exam.Appears younger than stated age  Head: Normocephalic without obvious abnormalities Eyes: No corneal or conjunctival inflammation noted.   Extraocular motion intact.  Nose: External nasal exam reveals no deformity or inflammation. Nasal mucosa are pink and moist. No lesions or exudates noted. Septum without deviatioi  Mouth: Oral mucosa and oropharynx reveal no lesions or exudates. Dentures in good repair. Neck: No deformities, masses, or tenderness noted. Range of motion normal. Goiter present  . Lungs: Normal respiratory effort; chest expands symmetrically. Lungs are clear to auscultation without rales, wheezes, or increased work of breathing. Heart: Normal rate and rhythm. Normal S1 and S2. No gallop, click, or rub. Grade1/6  murmur. Abdomen: Bowel sounds normal; abdomen soft and nontender. No masses, organomegaly or hernias noted.                            Musculoskeletal/extremities: No clubbing, cyanosis, edema, or significant extremity  deformity noted. Tone & strength  Normal. Joints normal . Nail health good. Able to lie down & sit up w/o help.  Vascular: Carotid, radial artery, dorsalis pedis and  posterior tibial pulses are full and equal. No bruits present. Neurologic: Alert and oriented x3. Deep tendon reflexes symmetrical and normal.       Skin: Intact without suspicious lesions or rashes. Lymph: No cervical, axillary lymphadenopathy present. Psych: Mood and affect are normal. Normally interactive  Assessment & Plan:  See Current Assessment & Plan in Problem List under specific Diagnosis

## 2012-11-17 NOTE — Assessment & Plan Note (Signed)
Goals discussed 

## 2012-11-17 NOTE — Assessment & Plan Note (Signed)
TSH will be checked to verify that it is therapeutic

## 2012-11-17 NOTE — Patient Instructions (Addendum)
Minimal Blood Pressure Goal= AVERAGE < 140/90;  Ideal is an AVERAGE < 135/85. This AVERAGE should be calculated from @ least 5-7 BP readings taken @ different times of day on different days of week. You should not respond to isolated BP readings , but rather the AVERAGE for that week .Please bring your  blood pressure cuff to office visits to verify that it is reliable.It  can also be checked against the blood pressure device at the pharmacy. Finger or wrist cuffs are not dependable; an arm cuff is. Eat a low-fat diet with lots of fruits and vegetables, up to 7-9 servings per day. Consume less than  30  Grams (preferably ZERO) of sugar per day from foods & drinks with High Fructose Corn Syrup (HFCS) sugar as #1,2,3 or # 4 on label. Follow a  low carb nutrition program such as [place] or [employer]  to prevent Diabetes progression . White carbohydrates (potatoes, rice, bread, and pasta) have a high spike of sugar and a high load of sugar. For example a  baked potato has a cup of sugar and a  french fry  2 teaspoons of sugar. Yams, wild  rice, whole grained bread &  wheat pasta have been much lower spike and load of  sugar. Portions should be the size of a deck of cards or your palm. Check glucose once daily if possible Fasting or morning glucose recommended M, W, F, & Sun if possible. Goal= 100-150 Glucose 2 hours after breakfast Tues, after lunch Thurs & 2 hrs after eve meal Sat if possible. Goal = < 180

## 2013-02-10 ENCOUNTER — Encounter: Payer: Self-pay | Admitting: Obstetrics & Gynecology

## 2013-02-11 ENCOUNTER — Encounter: Payer: Self-pay | Admitting: Certified Nurse Midwife

## 2013-02-11 ENCOUNTER — Ambulatory Visit: Payer: Self-pay | Admitting: Obstetrics & Gynecology

## 2013-02-11 ENCOUNTER — Ambulatory Visit (INDEPENDENT_AMBULATORY_CARE_PROVIDER_SITE_OTHER): Payer: BC Managed Care – PPO | Admitting: Certified Nurse Midwife

## 2013-02-11 VITALS — BP 130/84 | HR 92 | Resp 16 | Ht 66.75 in | Wt 233.0 lb

## 2013-02-11 DIAGNOSIS — Z01419 Encounter for gynecological examination (general) (routine) without abnormal findings: Secondary | ICD-10-CM

## 2013-02-11 DIAGNOSIS — Z0000 Encounter for general adult medical examination without abnormal findings: Secondary | ICD-10-CM

## 2013-02-11 LAB — POCT URINALYSIS DIPSTICK
Bilirubin, UA: NEGATIVE
Glucose, UA: NEGATIVE
Ketones, UA: NEGATIVE

## 2013-02-11 NOTE — Patient Instructions (Signed)

## 2013-02-11 NOTE — Progress Notes (Signed)
Note reviewed, agree with plan.  Reakwon Barren, MD  

## 2013-02-11 NOTE — Progress Notes (Signed)
Patient ID: Kathleen Lucero, female   DOB: [birth year], 56 y.o.   MRN: [id number] 57 y.o. [id number] Married African American Fe here for annual exam. Menopausal no HRT. Denies vaginal bleeding or dryness. Currently still under follow up for thyroid, with Dr. Alwyn Ren. Sees PCP for aex and labs and hypertension, and cholesterol management. Travels with job all the time. Still struggles with weight loss and diet selections with travel. No foreign country travel. No health issues today.   No LMP recorded. Patient is postmenopausal.          Sexually active: no  The current method of family planning is abstinence.    Exercising: yes  walking 2 times a week sometimes Smoker:  no  Health Maintenance: Pap:  01/20/12, WNL, neg HR HPV MMG:  12/16/12, BI-Rads 1: negative Colonoscopy:  2011, repeat in 3 years. Pt has recived recall letter from GI. BMD:   2008 TDaP:  ?, will update with PCP Labs: HB: PCP Urine:    reports that she has never smoked. She has never used smokeless tobacco. She reports that she does not drink alcohol or use illicit drugs.  Past Medical History  Diagnosis Date  . Hypertension   . Hyperlipidemia   . Heart murmur   . Goiter   . Uterine fibroid     Dr Hyacinth Meeker  . GERD (gastroesophageal reflux disease) 2007  . T3 thyrotoxicosis 2011    S/P RAI  . Degenerative disc disease     S/P ruptured disc, Dr. Worthy Rancher    Past Surgical History  Procedure Laterality Date  . Wisdom tooth extraction    . Colonoscopy with polypectomy  2011    Dr Loreta Ave; due 2014  . Tubal ligation  1981    Current Outpatient Prescriptions  Medication Sig Dispense Refill  . diltiazem (TIAZAC) 180 MG 24 hr capsule Take 1 capsule (180 mg total) by mouth daily.  90 capsule  1  . glucose blood (FREESTYLE INSULINX TEST) test strip Check blood sugar daily as directed DX: 790.29  100 each  3  . Lancets (FREESTYLE) lancets Check blood sugar daily as directed, DX 790.29  100 each  3  . rosuvastatin  (CRESTOR) 20 MG tablet Take 10 mg by mouth daily. Takes 10 mg on Monday, Wed, Friday and Sunday.  20 mg all other days.      Marland Kitchen spironolactone (ALDACTONE) 25 MG tablet Take 1 tablet (25 mg total) by mouth 2 (two) times daily.  180 tablet  1  . traMADol (ULTRAM) 50 MG tablet Take 1 tablet (50 mg total) by mouth every 6 (six) hours as needed for pain.  15 tablet  0   No current facility-administered medications for this visit.    Family History  Problem Relation Age of Onset  . Hypertension Mother   . Stroke Mother 35    5 days post partum  . Hypertension Brother      2 brothers  . Cancer Brother 76    colorectal  . Hypertension Maternal Grandmother   . Thyroid disease Maternal Grandmother     hypothyroidism  . Diabetes Maternal Grandmother     diet controlled  . Dementia Maternal Grandmother 79  . Alzheimer's disease Maternal Grandmother     ROS:  Pertinent items are noted in HPI.  Otherwise, a comprehensive ROS was negative.  Exam:   BP 130/84  Pulse 92  Resp 16  Ht 5' 6.75" (1.695 m)  Wt 233 lb (105.688 kg)  BMI  36.79 kg/m2 Height: 5' 6.75" (169.5 cm)  Ht Readings from Last 3 Encounters:  02/11/13 5' 6.75" (1.695 m)  10/03/12 5' 8.5" (1.74 m)  10/04/11 5\' 7"  (1.702 m)    General appearance: alert, cooperative and appears stated age Head: Normocephalic, without obvious abnormality, atraumatic Neck: no adenopathy, supple, symmetrical, trachea midline and thyroid enlarged and no nodules noted. Lungs: clear to auscultation bilaterally Breasts: normal appearance, no masses or tenderness, No nipple retraction or dimpling, No nipple discharge or bleeding, No axillary or supraclavicular adenopathy Heart: regular rate and rhythm Abdomen: soft, non-tender; no masses,  no organomegaly Extremities: extremities normal, atraumatic, no cyanosis or edema Skin: Skin color, texture, turgor normal. No rashes or lesions Lymph nodes: Cervical, supraclavicular, and axillary nodes  normal. No abnormal inguinal nodes palpated Neurologic: Grossly normal   Pelvic: External genitalia:  no lesions              Urethra:  normal appearing urethra with no masses, tenderness or lesions              Bartholin's and Skene's: normal                 Vagina: normal appearing vagina with normal color and discharge, no lesions              Cervix:  no tender, normal              Pap taken: no Bimanual Exam:  Uterus:  enlarged, 8 weeks size and firm  History of fibroids with no size change              Adnexa: normal adnexa and no mass, fullness, tenderness               Rectovaginal: Confirms               Anus:  normal sphincter tone, no lesions  A:  Well Woman with normal exam  Menopausal no HRT  Cholesterol management per PCP with stable medication  Immunization update discussed risks ad benefits   P:   Reviewed health and wellness pertinent to exam  Aware of need to evaluate if vaginal bleeding  Continue PCP follow up as indcated  Declines TDAP here,will do at PCP( she also declined in 2013)  Pap smear as per guidelines   Mammogram yearly pap smear not taken today  counseled on breast self exam, mammography screening, menopause, adequate intake of calcium and vitamin D, dietary changes to help with weight loss and control, encouraged use of Calorie king app for salt and calorie content and regular exercise, Kegel's exercises  return annually or prn  An After Visit Summary was printed and given to the patient.

## 2013-02-15 ENCOUNTER — Ambulatory Visit: Payer: BC Managed Care – PPO | Admitting: Obstetrics & Gynecology

## 2013-02-28 ENCOUNTER — Other Ambulatory Visit: Payer: Self-pay | Admitting: Internal Medicine

## 2013-03-01 ENCOUNTER — Other Ambulatory Visit: Payer: Self-pay | Admitting: *Deleted

## 2013-03-01 NOTE — Telephone Encounter (Signed)
Patient is requesting refill for Cardizem 180 MG. Last time refilled was 12/16/2011. Please advise

## 2013-03-01 NOTE — Telephone Encounter (Signed)
Refill #90 

## 2013-06-14 ENCOUNTER — Encounter: Payer: Self-pay | Admitting: Family Medicine

## 2013-06-14 ENCOUNTER — Ambulatory Visit (INDEPENDENT_AMBULATORY_CARE_PROVIDER_SITE_OTHER): Payer: BC Managed Care – PPO | Admitting: Family Medicine

## 2013-06-14 VITALS — BP 134/80 | HR 78 | Temp 98.1°F

## 2013-06-14 DIAGNOSIS — J019 Acute sinusitis, unspecified: Secondary | ICD-10-CM

## 2013-06-14 MED ORDER — FLUTICASONE PROPIONATE 50 MCG/ACT NA SUSP
2.0000 | Freq: Every day | NASAL | Status: DC
Start: 2013-06-14 — End: 2015-07-14

## 2013-06-14 MED ORDER — AMOXICILLIN-POT CLAVULANATE 875-125 MG PO TABS: 1.0000 | ORAL_TABLET | Freq: Two times a day (BID) | ORAL | Status: DC

## 2013-06-14 NOTE — Progress Notes (Signed)
  Subjective:     Kathleen Lucero is a 58 y.o. female who presents for evaluation of sinus pain. Symptoms include: congestion, cough, facial pain, nasal congestion and sinus pressure. Onset of symptoms was 1 month ago. Symptoms have been gradually worsening since that time. Past history is significant for no history of pneumonia or bronchitis. Patient is a non-smoker. Pt also c/o L low leg pain x 1 month.  No sob..    The following portions of the patient's history were reviewed and updated as appropriate: allergies, current medications, past family history, past medical history, past social history, past surgical history and problem list.  Review of Systems Pertinent items are noted in HPI.   Objective:    BP 134/80  Pulse 78  Temp(Src) 98.1 F (36.7 C) (Oral)  SpO2 97% General appearance: alert, cooperative, appears stated age and no distress Ears: normal TM's and external ear canals both ears Nose: green discharge, moderate congestion, turbinates red, swollen, sinus tenderness bilateral Throat: lips, mucosa, and tongue normal; teeth and gums normal Neck: moderate anterior cervical adenopathy, supple, symmetrical, trachea midline and thyroid not enlarged, symmetric, no tenderness/mass/nodules Lungs: clear to auscultation bilaterally Heart: S1, S2 normal Extremities: extremities normal, atraumatic, no cyanosis or edema --no calf pain, no errythema , no swelling   Assessment:    Acute bacterial sinusitis.    Plan:    Nasal steroids per medication orders. Antihistamines per medication orders. Augmentin per medication orders.

## 2013-06-14 NOTE — Progress Notes (Signed)
Pre visit review using our clinic review tool, if applicable. No additional management support is needed unless otherwise documented below in the visit note. 

## 2013-06-14 NOTE — Patient Instructions (Signed)

## 2013-06-15 ENCOUNTER — Ambulatory Visit: Payer: BC Managed Care – PPO | Admitting: Family Medicine

## 2013-06-16 ENCOUNTER — Ambulatory Visit (INDEPENDENT_AMBULATORY_CARE_PROVIDER_SITE_OTHER): Payer: BC Managed Care – PPO | Admitting: Endocrinology

## 2013-06-16 ENCOUNTER — Encounter: Payer: Self-pay | Admitting: Endocrinology

## 2013-06-16 VITALS — BP 128/86 | HR 71 | Temp 98.3°F | Ht 66.75 in | Wt 232.0 lb

## 2013-06-16 DIAGNOSIS — E042 Nontoxic multinodular goiter: Secondary | ICD-10-CM

## 2013-06-16 NOTE — Patient Instructions (Signed)
Let's recheck the ultrasound.  you will receive a phone call, about a day and time for an appointment. Also, a thyroid blood test is requested for you today.  We'll contact you with results.   Please return in 1 year.

## 2013-06-16 NOTE — Progress Notes (Signed)
Subjective:    Patient ID: Kathleen Lucero, female    DOB: [birth year], 58 y.o.   MRN: [id number]  HPI pt was noted in 2007 to have a multinodular goiter.  In 2010, she was noted to have a suppressed TSH, so she underwent I-131 rx.  She became euthyroid.  In 2012, f/u ultrasound was unchanged from prior to I-131 rx.  pt states she feels well in general, except for fatigue.   Past Medical History  Diagnosis Date  . Hypertension   . Hyperlipidemia   . Heart murmur   . Goiter   . Uterine fibroid     Dr Sabra Heck  . GERD (gastroesophageal reflux disease) 2007  . T3 thyrotoxicosis 2011    S/P RAI  . Degenerative disc disease     S/P ruptured disc, Dr. Lindwood Qua    Past Surgical History  Procedure Laterality Date  . Wisdom tooth extraction    . Colonoscopy with polypectomy  2011    Dr Collene Mares; due 2014  . Tubal ligation  1981    History   Social History  . Marital Status: Married    Spouse Name: N/A    Number of Children: N/A  . Years of Education: N/A   Occupational History  . Not on file.   Social History Main Topics  . Smoking status: Never Smoker   . Smokeless tobacco: Never Used  . Alcohol Use: No  . Drug Use: No  . Sexual Activity: No   Other Topics Concern  . Not on file   Social History Narrative   Walks 2X wkly    Current Outpatient Prescriptions on File Prior to Visit  Medication Sig Dispense Refill  . amoxicillin-clavulanate (AUGMENTIN) 875-125 MG per tablet Take 1 tablet by mouth 2 (two) times daily.  20 tablet  0  . diltiazem (TIAZAC) 180 MG 24 hr capsule Take 1 capsule (180 mg total) by mouth daily.  90 capsule  1  . fluticasone (FLONASE) 50 MCG/ACT nasal spray Place 2 sprays into both nostrils daily.  16 g  6  . rosuvastatin (CRESTOR) 20 MG tablet Takes 10 mg on Monday, Wed, Friday and Sunday.  20 mg all other days.      Marland Kitchen spironolactone (ALDACTONE) 25 MG tablet Take 1 tablet (25 mg total) by mouth 2 (two) times daily.  180 tablet  1  . traMADol  (ULTRAM) 50 MG tablet Take 1 tablet (50 mg total) by mouth every 6 (six) hours as needed for pain.  15 tablet  0   No current facility-administered medications on file prior to visit.    Allergies  Allergen Reactions  . Other Hives    Blood Pressure Medicine  . Codeine Sulfate     REACTION: nausea    Family History  Problem Relation Age of Onset  . Hypertension Mother   . Stroke Mother 35    5 days post partum  . Hypertension Brother      2 brothers  . Cancer Brother 2    colorectal  . Hypertension Maternal Grandmother   . Thyroid disease Maternal Grandmother     hypothyroidism  . Diabetes Maternal Grandmother     diet controlled  . Dementia Maternal Grandmother 34  . Alzheimer's disease Maternal Grandmother     BP 128/86  Pulse 71  Temp(Src) 98.3 F (36.8 C) (Oral)  Ht 5' 6.75" (1.695 m)  Wt 232 lb (105.235 kg)  BMI 36.63 kg/m2  SpO2 95%  Review of Systems  She has gained weight.    Objective:   Physical Exam VITAL SIGNS:  See vs page GENERAL: no distress Neck: small multinodular goiter, but I can't tell details.     Assessment & Plan:  multinodular goiter.  Euthyroid after i-131 rx.

## 2013-06-17 LAB — TSH: TSH: 1.9 u[IU]/mL (ref 0.35–5.50)

## 2013-06-29 ENCOUNTER — Ambulatory Visit
Admission: RE | Admit: 2013-06-29 | Discharge: 2013-06-29 | Disposition: A | Discharge status: Home / Self Care | Payer: BC Managed Care – PPO | Source: Ambulatory Visit | Attending: Endocrinology | Admitting: Endocrinology

## 2013-06-29 DIAGNOSIS — E042 Nontoxic multinodular goiter: Secondary | ICD-10-CM

## 2013-08-24 ENCOUNTER — Encounter: Payer: BC Managed Care – PPO | Admitting: Family Medicine

## 2013-11-02 ENCOUNTER — Other Ambulatory Visit: Payer: Self-pay | Admitting: Internal Medicine

## 2013-11-06 ENCOUNTER — Other Ambulatory Visit: Payer: Self-pay | Admitting: Internal Medicine

## 2013-11-19 ENCOUNTER — Encounter: Payer: Self-pay | Admitting: Internal Medicine

## 2013-11-19 ENCOUNTER — Ambulatory Visit (INDEPENDENT_AMBULATORY_CARE_PROVIDER_SITE_OTHER): Payer: BC Managed Care – PPO | Admitting: Internal Medicine

## 2013-11-19 ENCOUNTER — Other Ambulatory Visit (INDEPENDENT_AMBULATORY_CARE_PROVIDER_SITE_OTHER): Payer: BC Managed Care – PPO

## 2013-11-19 VITALS — BP 122/86 | HR 64 | Temp 97.9°F | Ht 66.75 in | Wt 229.0 lb

## 2013-11-19 DIAGNOSIS — E785 Hyperlipidemia, unspecified: Secondary | ICD-10-CM

## 2013-11-19 DIAGNOSIS — I10 Essential (primary) hypertension: Secondary | ICD-10-CM

## 2013-11-19 DIAGNOSIS — Z0000 Encounter for general adult medical examination without abnormal findings: Secondary | ICD-10-CM

## 2013-11-19 DIAGNOSIS — R7309 Other abnormal glucose: Secondary | ICD-10-CM

## 2013-11-19 DIAGNOSIS — R7303 Prediabetes: Secondary | ICD-10-CM

## 2013-11-19 DIAGNOSIS — Z86010 Personal history of colonic polyps: Secondary | ICD-10-CM | POA: Insufficient documentation

## 2013-11-19 LAB — LIPID PANEL
CHOLESTEROL: 187 mg/dL (ref 0–200)
HDL: 49.1 mg/dL (ref >39.00)
LDL CALC: 120 mg/dL — AB (ref 0–99)
NonHDL: 137.9
TRIGLYCERIDES: 92 mg/dL (ref 0.0–149.0)
Total CHOL/HDL Ratio: 4
VLDL: 18.4 mg/dL (ref 0.0–40.0)

## 2013-11-19 LAB — BASIC METABOLIC PANEL
BUN: 15 mg/dL (ref 6–23)
CHLORIDE: 101 meq/L (ref 96–112)
CO2: 25 mEq/L (ref 19–32)
Calcium: 10.2 mg/dL (ref 8.4–10.5)
Creatinine, Ser: 1.1 mg/dL (ref 0.4–1.2)
GFR: 69.5 mL/min (ref >60.00)
GLUCOSE: 98 mg/dL (ref 70–99)
Potassium: 4.2 mEq/L (ref 3.5–5.1)
Sodium: 136 mEq/L (ref 135–145)

## 2013-11-19 LAB — CBC WITH DIFFERENTIAL/PLATELET
BASOS ABS: 0 10*3/uL (ref 0.0–0.1)
Basophils Relative: 0.7 % (ref 0.0–3.0)
Eosinophils Absolute: 0.2 10*3/uL (ref 0.0–0.7)
Eosinophils Relative: 3.2 % (ref 0.0–5.0)
HEMATOCRIT: 42.8 % (ref 36.0–46.0)
Hemoglobin: 14.1 g/dL (ref 12.0–15.0)
LYMPHS ABS: 2.1 10*3/uL (ref 0.7–4.0)
Lymphocytes Relative: 39.7 % (ref 12.0–46.0)
MCHC: 33 g/dL (ref 30.0–36.0)
MCV: 83.7 fl (ref 78.0–100.0)
MONO ABS: 0.4 10*3/uL (ref 0.1–1.0)
Monocytes Relative: 6.7 % (ref 3.0–12.0)
NEUTROS PCT: 49.7 % (ref 43.0–77.0)
Neutro Abs: 2.7 10*3/uL (ref 1.4–7.7)
PLATELETS: 217 10*3/uL (ref 150.0–400.0)
RBC: 5.11 Mil/uL (ref 3.87–5.11)
RDW: 13.8 % (ref 11.5–15.5)
WBC: 5.4 10*3/uL (ref 4.0–10.5)

## 2013-11-19 LAB — HEPATIC FUNCTION PANEL
ALBUMIN: 4.4 g/dL (ref 3.5–5.2)
ALT: 21 U/L (ref 0–35)
AST: 21 U/L (ref 0–37)
Alkaline Phosphatase: 97 U/L (ref 39–117)
Bilirubin, Direct: 0.1 mg/dL (ref 0.0–0.3)
Total Bilirubin: 0.7 mg/dL (ref 0.2–1.2)
Total Protein: 8.1 g/dL (ref 6.0–8.3)

## 2013-11-19 LAB — HEMOGLOBIN A1C: Hgb A1c MFr Bld: 6.4 % (ref 4.6–6.5)

## 2013-11-19 MED ORDER — SPIRONOLACTONE 25 MG PO TABS: 25.0000 mg | ORAL_TABLET | Freq: Every day | ORAL | Status: DC

## 2013-11-19 NOTE — Progress Notes (Signed)
   Subjective:    Patient ID: Kathleen Lucero, female    DOB: [birth year], 57 y.o.   MRN: [id number]  HPI  She is here for a physical;acute issues denied.  She is on no specific nutritional program. She is not engaged in a regular exercise program. Her blood pressure average is less than 120/80. Her TSH was 1.90 in February of this year. She has no current lipids or A1c on record since 2013. Colonoscopy was recommended last year; this has not been scheduled. She's been asked to sign a release of records to document pathology of the colon polyps removed in 2011 by Dr. Collene Mares.Her brother had colorectal cancer.    Review of Systems Unexplained weight loss, abdominal pain, significant dyspepsia, dysphagia, melena, rectal bleeding, or persistently small caliber stools are denied.     Objective:   Physical Exam  Gen.: Healthy and well-nourished in appearance. As per CDC Guidelines ,Epic documents obesity as being present . Alert, appropriate and cooperative throughout exam. Appears younger than stated age  Head: Normocephalic without obvious abnormalities. Eyes: No corneal or conjunctival inflammation noted. Pupils equal round reactive to light and accommodation. Extraocular motion intact. Ears: External  ear exam reveals no significant lesions or deformities. Canals clear .TMs normal. Hearing is grossly normal bilaterally. Nose: External nasal exam reveals no deformity or inflammation. Nasal mucosa are pink and moist. No lesions or exudates noted.   Mouth: Oral mucosa and oropharynx reveal no lesions or exudates. Dentures in good repair. Neck: No deformities, masses, or tenderness noted. Range of motion & Thyroid normal. Lungs: Normal respiratory effort; chest expands symmetrically. Lungs are clear to auscultation without rales, wheezes, or increased work of breathing. Heart: Normal rate and rhythm. Normal S1 and S2. No gallop, click, or rub. Grade 1/6 systolic murmur. Abdomen: Bowel sounds  normal; abdomen soft and nontender. No masses, organomegaly or hernias noted. Genitalia: as per Gyn                                  Musculoskeletal/extremities: No deformity or scoliosis noted of  the thoracic or lumbar spine.  No clubbing, cyanosis, edema, or significant extremity  deformity noted. Range of motion normal .Tone & strength normal. Hand joints normal  Fingernail / toenail health good. Able to lie down & sit up w/o help. Negative SLR bilaterally Vascular: Carotid, radial artery, dorsalis pedis and  posterior tibial pulses are equal. Decreased DPP.No bruits present. Neurologic: Alert and oriented x3. Deep tendon reflexes symmetrical and normal.  Gait normal .   Skin: Intact without suspicious lesions or rashes. Lymph: No cervical, axillary lymphadenopathy present. Psych: Mood and affect are normal. Normally interactive                                                                                        Assessment & Plan:   #1 comprehensive physical exam; no acute findings  Plan: see Orders  & Recommendations

## 2013-11-19 NOTE — Progress Notes (Signed)
Pre visit review using our clinic review tool, if applicable. No additional management support is needed unless otherwise documented below in the visit note. 

## 2013-11-19 NOTE — Patient Instructions (Signed)

## 2013-11-20 DIAGNOSIS — E669 Obesity, unspecified: Secondary | ICD-10-CM | POA: Insufficient documentation

## 2013-12-20 ENCOUNTER — Other Ambulatory Visit: Payer: Self-pay | Admitting: Internal Medicine

## 2014-01-04 ENCOUNTER — Telehealth: Payer: Self-pay | Admitting: Internal Medicine

## 2014-01-04 NOTE — Telephone Encounter (Signed)
Patient Information:  Caller Name: Kaydince  Phone: [phone]  Patient: Kathleen Lucero, Kathleen Lucero  Gender: Female  DOB: May 31, 1955  Age: 58 Years  PCP: Unice Cobble  Office Follow Up:  Does the office need to follow up with this patient?: Yes  Instructions For The Office: Please follow up with patient with appointment for today that follows see today disposition or further instruction from provider and approval to wait until tomorrow for evaluation or see in Urgent Care if not ok to wait due to History of Rheumatic fever.  RN Note:  No loose stool since Sunday 01/02/14.  Headache 6/10 intermittent.  Sore throat pain 7/10 states is able to swallow however has been spitting because she doesnt want to swallow.  Mild abdominal discomfort.  Patient states that she had rheumatic fever when she was a kid many many years ago.  Patient spoke with scheduling and appointment was given for 01/05/14 at 10:15 for evaluation.  Recommendation per disposition is see today.  Patient would like to see Dr. Linna Darner or anyone that works closely with Dr. Linna Darner that can speak with him if need be in regards to history or care as she is afraid to take any medication due to High Blood pressure.  Symptoms  Reason For Call & Symptoms: Headache, Diarrhea, Sore throat  Reviewed Health History In EMR: Yes  Reviewed Medications In EMR: Yes  Reviewed Allergies In EMR: Yes  Reviewed Surgeries / Procedures: Yes  Date of Onset of Symptoms: 12/31/2013  Treatments Tried: Cepacol throat lozenges  Treatments Tried Worked: No  Guideline(s) Used:  Sore Throat  Headache  Disposition Per Guideline:   See Today in Office  Reason For Disposition Reached:   History of rheumatic fever  Advice Given:  For Relief of Sore Throat Pain:  Sip warm chicken broth or apple juice.  Suck on hard candy or a throat lozenge (over-the-counter).  Gargle warm salt water 3 times daily (1 teaspoon of salt in 8 oz or 240 ml of warm water).  Pain Medicines:  Acetaminophen (e.g., Tylenol):  Regular Strength Tylenol: Take 650 mg (two 325 mg pills) by mouth every 4-6 hours as needed. Each Regular Strength Tylenol pill has 325 mg of acetaminophen.  Patient Will Follow Care Advice:  YES

## 2014-01-05 ENCOUNTER — Other Ambulatory Visit: Payer: BC Managed Care – PPO

## 2014-01-05 ENCOUNTER — Ambulatory Visit (INDEPENDENT_AMBULATORY_CARE_PROVIDER_SITE_OTHER): Payer: BC Managed Care – PPO | Admitting: Internal Medicine

## 2014-01-05 ENCOUNTER — Encounter: Payer: Self-pay | Admitting: Internal Medicine

## 2014-01-05 VITALS — BP 118/84 | HR 60 | Temp 98.2°F | Wt 229.1 lb

## 2014-01-05 DIAGNOSIS — R195 Other fecal abnormalities: Secondary | ICD-10-CM

## 2014-01-05 DIAGNOSIS — IMO0001 Reserved for inherently not codable concepts without codable children: Secondary | ICD-10-CM

## 2014-01-05 DIAGNOSIS — J069 Acute upper respiratory infection, unspecified: Secondary | ICD-10-CM

## 2014-01-05 DIAGNOSIS — J029 Acute pharyngitis, unspecified: Secondary | ICD-10-CM

## 2014-01-05 DIAGNOSIS — M2550 Pain in unspecified joint: Secondary | ICD-10-CM

## 2014-01-05 MED ORDER — ROSUVASTATIN CALCIUM 20 MG PO TABS: ORAL_TABLET | ORAL | Status: DC

## 2014-01-05 NOTE — Patient Instructions (Signed)
Please take a probiotic , Florastor OR Align, every day until the bowels are normal. This will replace the normal bacteria which  are necessary for formation of normal stool and processing of food.  Zicam Melts or Zinc lozenges as per package label for sore throat . Complementary options include  vitamin C 2000 mg daily; & Echinacea for 4-7 days. Report persistent or progressive fever; discolored nasal or chest secretions; or frontal headache or facial  pain.      Plain Mucinex (NOT D) for thick secretions ;force NON dairy fluids .   Nasal cleansing in the shower as discussed with lather of mild shampoo.After 10 seconds wash off lather while  exhaling through nostrils. Make sure that all residual soap is removed to prevent irritation.  Flonase OR Nasacort AQ 1 spray in each nostril twice a day as needed. Use the "crossover" technique into opposite nostril spraying toward opposite ear @ 45 degree angle, not straight up into nostril.  Use a Neti pot daily only  as needed for significant sinus congestion; going from open side to congested side . Plain Allegra (NOT D )  160 daily , Loratidine 10 mg , OR Zyrtec 10 mg @ bedtime  as needed for itchy eyes & sneezing.

## 2014-01-05 NOTE — Progress Notes (Signed)
   Subjective:    Patient ID: Kathleen Lucero, female    DOB: 1956-02-27, 58 y.o.   MRN: [id number]  HPI   Symptoms began 12/31/13 as loose bowels. As of 8/25 these were semisolid without specific intervention  She also had associated sore throat, rhinitis, head congestion, itchy/watery eyes, and sneezing  She describes fever, chills, sweats. On 8/24 temp was 101.  She describes some frontal headache and a scratchiness in the left ear without discharge.  She also had associated myalgias and arthralgias   Review of Systems   At this time the major symptoms include frontal headache, sore throat, postnasal drainage, and recurrent throat clearing.  She denies purulence from the nares.  She has no associated cough, wheezing, shortness of breath, or sputum production.       Objective:   Physical Exam   Positive or pertinent findings include: She has complete dentures. The scleral vessels are prominent; there is no conjunctivitis. Extraocular motion and vision are intact. The remainder of the exam is unremarkable.  General appearance: As per CDC Guidelines ,Epic documents obesity as being present .Well nourished; no acute distress or increased work of breathing is present.  No  lymphadenopathy about the head, neck, or axilla noted.  Eyes: No conjunctival inflammation or lid edema is present. There is no scleral icterus. Ears:  External ear exam shows no significant lesions or deformities.  Otoscopic examination reveals clear canals, tympanic membranes are intact bilaterally without bulging, retraction, inflammation or discharge. Nose:  External nasal examination shows no deformity or inflammation. Nasal mucosa are dry without lesions or exudates. No septal dislocation or deviation.No obstruction to airflow.  Oral exam: lips and gums are healthy appearing.There is no oropharyngeal erythema or exudate noted.  Neck:  No deformities, thyromegaly, masses, or tenderness noted.   Supple  with full range of motion without pain.  Heart:  Normal rate and regular rhythm. S1 and S2 normal without gallop, murmur, click, rub or other extra sounds.  Lungs:Chest clear to auscultation; no wheezes, rhonchi,rales ,or rubs present.No increased work of breathing.   Extremities:  No cyanosis, edema, or clubbing  noted  Skin: Warm & dry w/o tenting.         Assessment & Plan:  #1 viral URI #2 stool change, resolved See AVS

## 2014-01-05 NOTE — Progress Notes (Signed)
Pre visit review using our clinic review tool, if applicable. No additional management support is needed unless otherwise documented below in the visit note. 

## 2014-01-10 IMAGING — CT CT HEAD W/O CM
1 of 2 series · 16 of 30 positions shown, 20 images · non-contrast
Comparison: None.

CLINICAL DATA: Fell.  Hit head.

CT HEAD WITHOUT CONTRAST
TECHNIQUE: Contiguous axial images were obtained from the base of
the skull through the vertex without contrast.

[Series 3: recon 2: brain · axial · 0.47mm/px · z∈[+102,+242]mm · 16 of 64 slices shown, 20 images]
[im 4/64  brain]
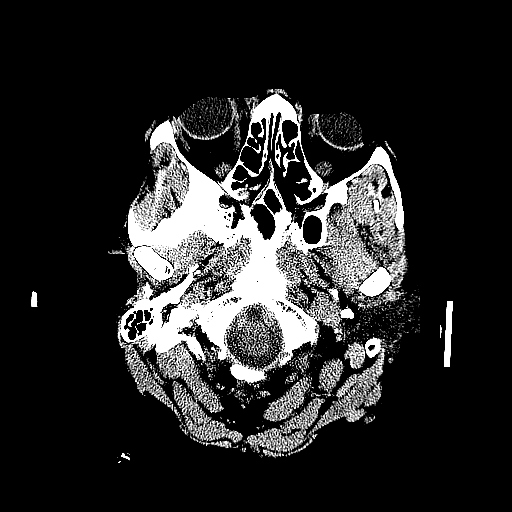
[im 4/64  bone]
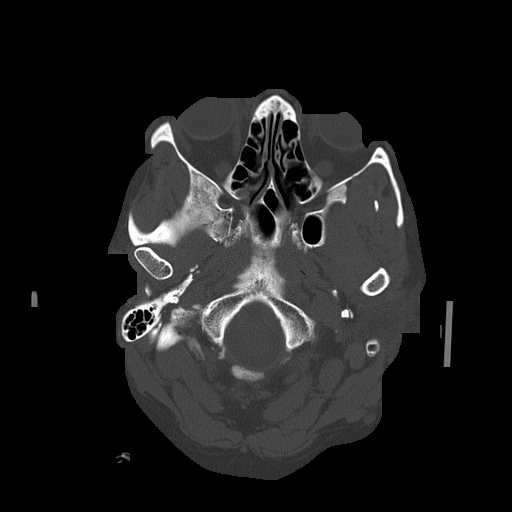
[im 7/64  brain]
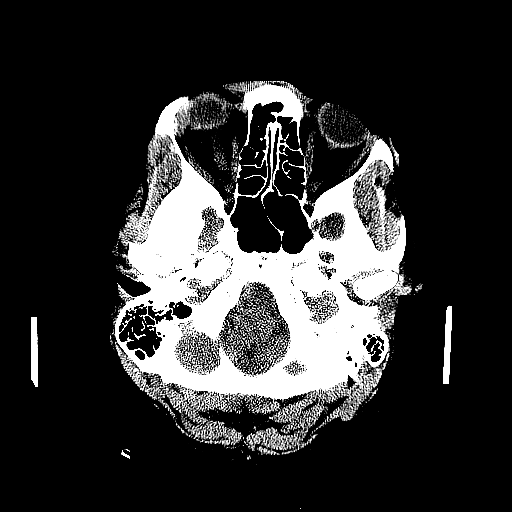
[im 10/64  brain]
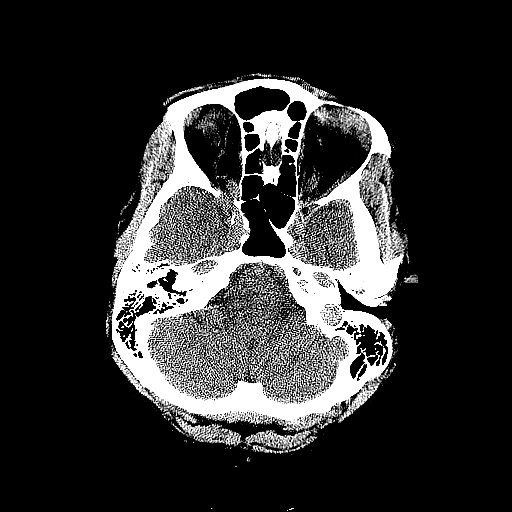
[im 14/64  brain]
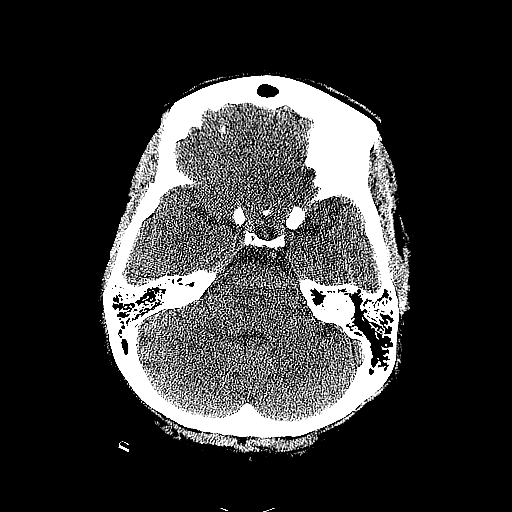
[im 20/64  brain]
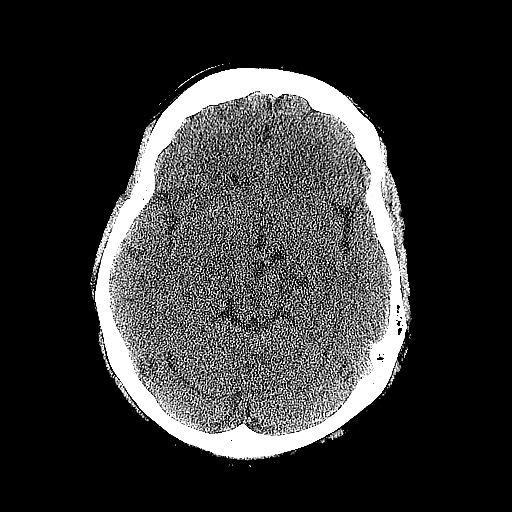
[im 20/64  bone]
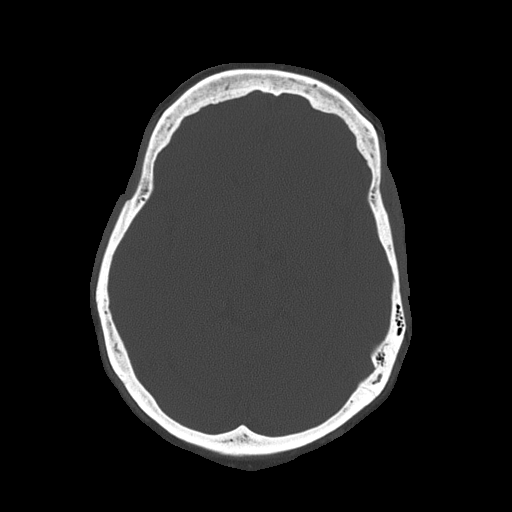
[im 24/64  brain]
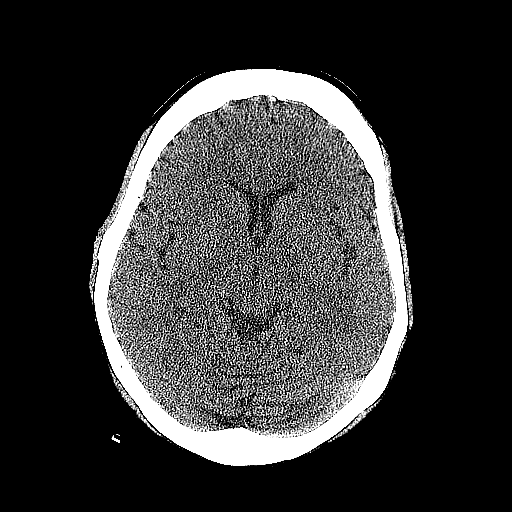
[im 27/64  brain]
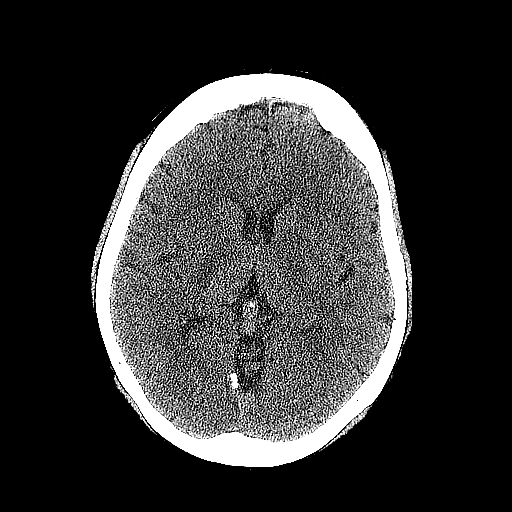
[im 30/64  brain]
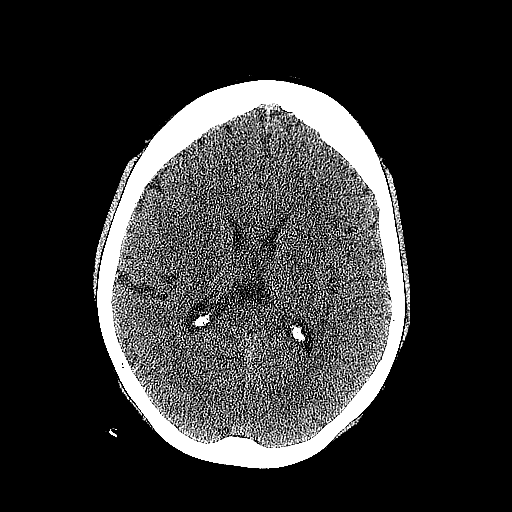
[im 34/64  brain]
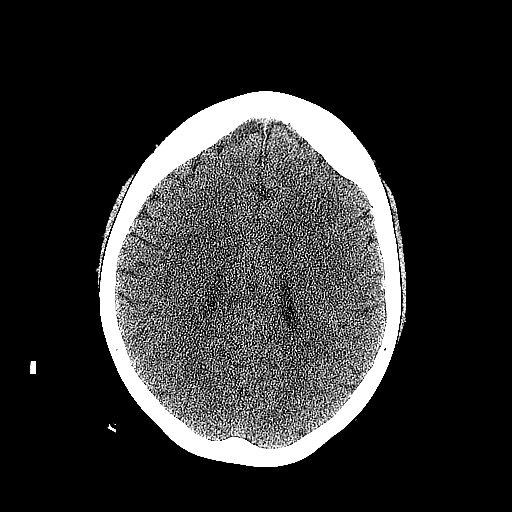
[im 34/64  bone]
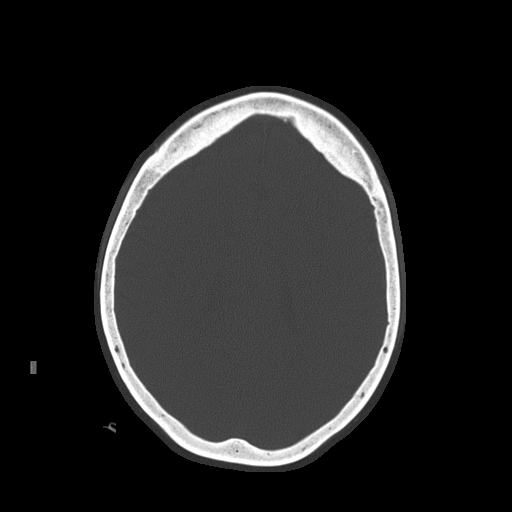
[im 37/64  brain]
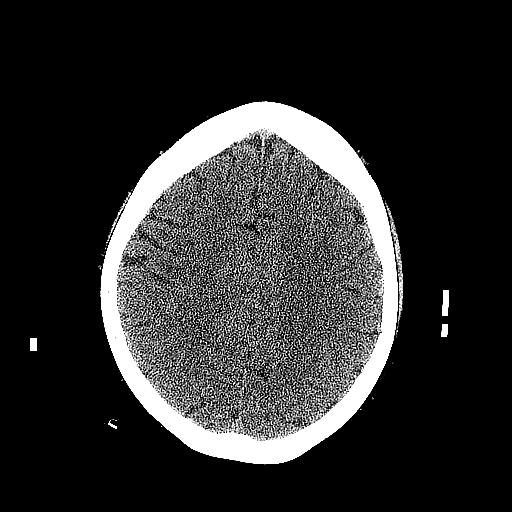
[im 40/64  brain]
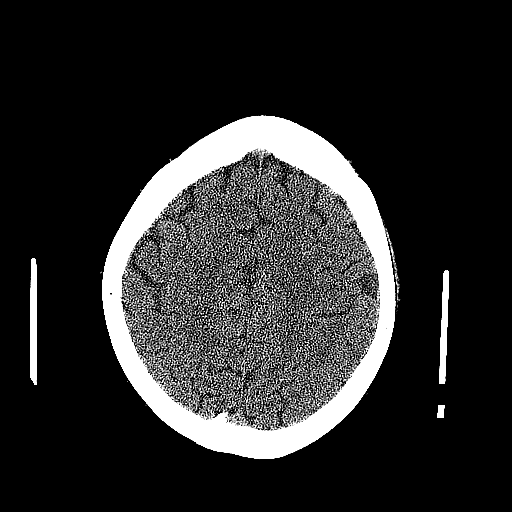
[im 44/64  brain]
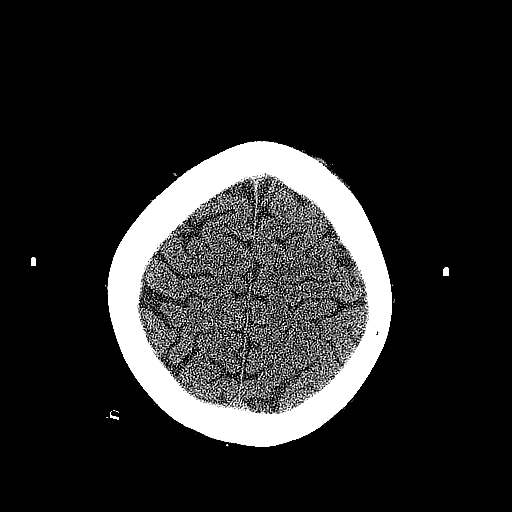
[im 50/64  brain]
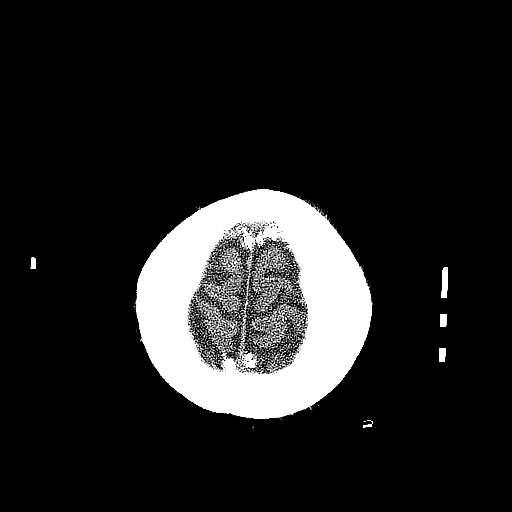
[im 50/64  bone]
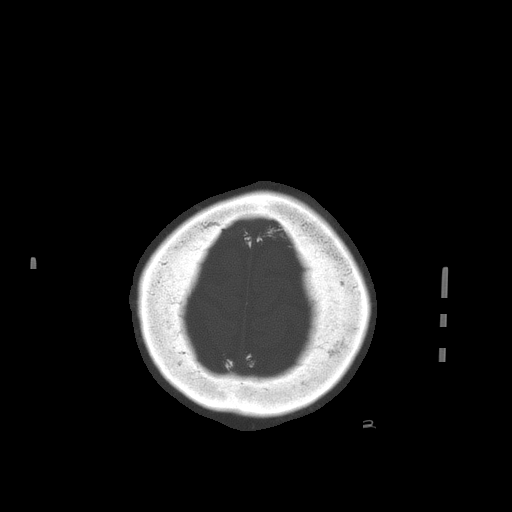
[im 54/64  brain]
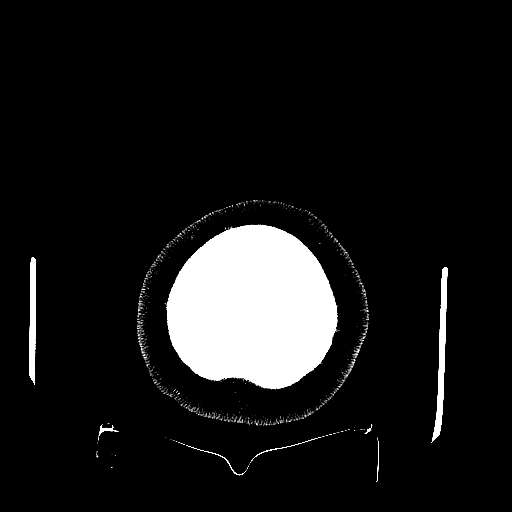
[im 57/64  brain]
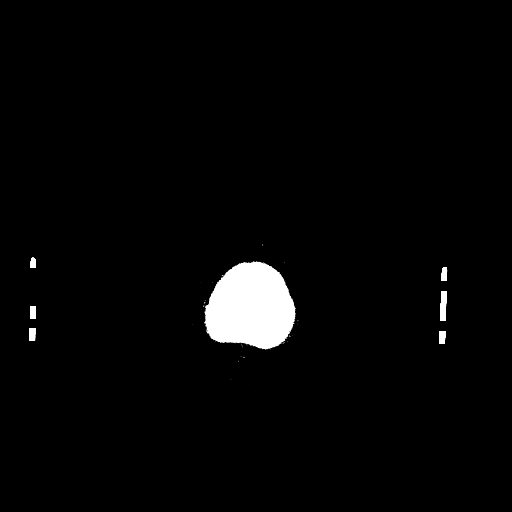
[im 60/64  brain]
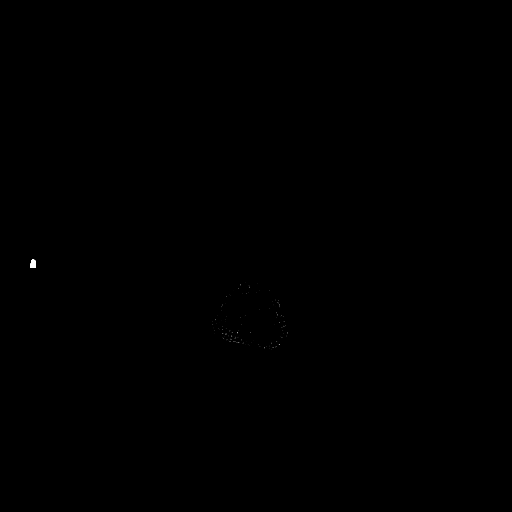

[16 of 30 positions shown; findings below may reference images not displayed]

FINDINGS: The ventricles are normal.  No extra-axial fluid
collections are seen.  The brainstem and cerebellum are
unremarkable.  No acute intracranial findings such as infarction or
hemorrhage.  No mass lesions.

The bony calvarium is intact.  The visualized paranasal sinuses and
mastoid air cells are clear.
IMPRESSION: No acute intracranial findings or skull fracture.

## 2014-02-14 ENCOUNTER — Encounter: Payer: Self-pay | Admitting: Certified Nurse Midwife

## 2014-02-14 ENCOUNTER — Ambulatory Visit (INDEPENDENT_AMBULATORY_CARE_PROVIDER_SITE_OTHER): Payer: BC Managed Care – PPO | Admitting: Certified Nurse Midwife

## 2014-02-14 VITALS — BP 122/82 | HR 72 | Resp 16 | Ht 66.75 in | Wt 231.0 lb

## 2014-02-14 DIAGNOSIS — Z0000 Encounter for general adult medical examination without abnormal findings: Secondary | ICD-10-CM

## 2014-02-14 DIAGNOSIS — Z124 Encounter for screening for malignant neoplasm of cervix: Secondary | ICD-10-CM

## 2014-02-14 DIAGNOSIS — R319 Hematuria, unspecified: Secondary | ICD-10-CM

## 2014-02-14 DIAGNOSIS — Z01419 Encounter for gynecological examination (general) (routine) without abnormal findings: Secondary | ICD-10-CM

## 2014-02-14 LAB — POCT URINALYSIS DIPSTICK
BILIRUBIN UA: NEGATIVE
Glucose, UA: NEGATIVE
Ketones, UA: NEGATIVE
Nitrite, UA: NEGATIVE
PH UA: 5
PROTEIN UA: NEGATIVE
Urobilinogen, UA: NEGATIVE

## 2014-02-14 LAB — HEMOGLOBIN, FINGERSTICK: Hemoglobin, fingerstick: 15.2 g/dL (ref 12.0–16.0)

## 2014-02-14 NOTE — Progress Notes (Signed)
58 y.o. [id number] Married African American Fe here for annual exam. Menopausal no HRT. Denies vaginal bleeding or vaginal dryness. Sees Dr. Linna Darner for aex and hypertension management/labs. Stress at work right now busy season. Still working on diet for constipation management. Denies urinary symptoms of burning,or frequency or urgency. No other issues today.   Patient's last menstrual period was 05/14/2007.          Sexually active: No.  The current method of family planning is tubal ligation.    Exercising: Yes.    occ walk Smoker:  no  Health Maintenance: Pap: 01-20-12 neg, HPV HR neg MMG: 12-16-12 birads 1:neg Colonoscopy:  2011 f/u 1yrs BMD:   2008 TDaP: pt will do with pcp Labs: Hgb-15.2, Poct urine-wbc tr, rbc tr Self breast exam: done monthly   reports that she has never smoked. She has never used smokeless tobacco. She reports that she does not drink alcohol or use illicit drugs.  Past Medical History  Diagnosis Date  . Hypertension   . Hyperlipidemia   . Heart murmur   . Goiter   . Uterine fibroid     Dr Sabra Heck  . GERD (gastroesophageal reflux disease) 2007  . T3 thyrotoxicosis 2011    S/P RAI  . Degenerative disc disease     S/P ruptured disc, Dr. Lindwood Qua    Past Surgical History  Procedure Laterality Date  . Wisdom tooth extraction    . Colonoscopy with polypectomy  2011    Dr Collene Mares; due 2014  . Tubal ligation  1981    Current Outpatient Prescriptions  Medication Sig Dispense Refill  . diltiazem (CARDIZEM CD) 180 MG 24 hr capsule TAKE ONE CAPSULE BY MOUTH EVERY DAY  90 capsule  0  . fluticasone (FLONASE) 50 MCG/ACT nasal spray Place 2 sprays into both nostrils daily.  16 g  6  . rosuvastatin (CRESTOR) 20 MG tablet Takes 10 mg on Monday, Wed, Friday and Sunday.  20 mg all other days.  90 tablet  1  . spironolactone (ALDACTONE) 25 MG tablet Take one tablet by mouth twice daily  180 tablet  1   No current facility-administered medications for this visit.     Family History  Problem Relation Age of Onset  . Hypertension Mother   . Stroke Mother 35    5 days post partum  . Hypertension Brother      2 brothers  . Cancer Brother 73    colorectal  . Hypertension Maternal Grandmother   . Thyroid disease Maternal Grandmother     hypothyroidism  . Diabetes Maternal Grandmother     diet controlled  . Dementia Maternal Grandmother 68  . Alzheimer's disease Maternal Grandmother   . Heart attack Neg Hx   . Dementia Paternal Aunt     ROS:  Pertinent items are noted in HPI.  Otherwise, a comprehensive ROS was negative.  Exam:   BP 122/82  Pulse 72  Resp 16  Ht 5' 6.75" (1.695 m)  Wt 231 lb (104.781 kg)  BMI 36.47 kg/m2  LMP 05/14/2007 Height: 5' 6.75" (169.5 cm)  Ht Readings from Last 3 Encounters:  02/14/14 5' 6.75" (1.695 m)  11/19/13 5' 6.75" (1.695 m)  06/16/13 5' 6.75" (1.695 m)    General appearance: alert, cooperative and appears stated age Head: Normocephalic, without obvious abnormality, atraumatic Neck: no adenopathy, supple, symmetrical, trachea midline and thyroid normal to inspection and palpation Lungs: clear to auscultation bilaterally Breasts: normal appearance, no masses or tenderness, No  nipple retraction or dimpling, No nipple discharge or bleeding, No axillary or supraclavicular adenopathy Heart: regular rate and rhythm Abdomen: soft, non-tender; no masses,  no organomegaly Extremities: extremities normal, atraumatic, no cyanosis or edema Skin: Skin color, texture, turgor normal. No rashes or lesions Lymph nodes: Cervical, supraclavicular, and axillary nodes normal. No abnormal inguinal nodes palpated Neurologic: Grossly normal   Pelvic: External genitalia:  no lesions              Urethra:  normal appearing urethra with no masses, tenderness or lesions              Bartholin's and Skene's: normal                 Vagina: normal appearing vagina with normal color and discharge, no lesions               Cervix: normal appearance, no lesions, or tenderness              Pap taken: Yes.   Bimanual Exam:  Uterus:  enlarged, 8 weeks size , non tender              Adnexa: normal adnexa               Rectovaginal: Confirms               Anus:  normal sphincter tone, no lesions  A:  Well Woman with normal exam  Post menopuasal no HRT  Enlarged uterus, history fibroid uterus, no size change from previous exam  Hypertension/cholesterol  With PCP management stable per patient  Social stress with job    P:   Reviewed health and wellness pertinent to exam  Aware of need to evaluate if vaginal bleeding  Aware of finding and patient has not noted any change  Continue follow up as indicated  Encouraged to take time out for self away from desk to help with stress  Colonoscopy due, patient to call and make appointment  Pap smear taken today with HPV reflex   counseled on breast self exam, mammography screening, adequate intake of calcium and vitamin D, diet and exercise  return annually or prn  An After Visit Summary was printed and given to the patient.

## 2014-02-14 NOTE — Patient Instructions (Signed)

## 2014-02-15 ENCOUNTER — Telehealth: Payer: Self-pay

## 2014-02-15 LAB — URINALYSIS, MICROSCOPIC ONLY
BACTERIA UA: NONE SEEN
CASTS: NONE SEEN
CRYSTALS: NONE SEEN
SQUAMOUS EPITHELIAL / LPF: NONE SEEN

## 2014-02-15 LAB — IPS PAP TEST WITH REFLEX TO HPV

## 2014-02-15 NOTE — Telephone Encounter (Signed)
[email or web address]

## 2014-02-15 NOTE — Telephone Encounter (Signed)
Message copied by Susy Manor on Tue Feb 15, 2014 10:47 AM ------      Message from: Regina Eck      Created: Tue Feb 15, 2014  8:01 AM       Notify urine micro negative, no blood noted ------

## 2014-02-16 NOTE — Telephone Encounter (Signed)
Spoke with patient. Advised of results as seen below. Patient is agreeable and verbalizes understanding.  Routing to provider for final review. Patient agreeable to disposition. Will close encounter.     

## 2014-02-16 NOTE — Progress Notes (Signed)
Reviewed personally.  M. Suzanne Glendi Mohiuddin, MD.  

## 2014-02-25 ENCOUNTER — Other Ambulatory Visit: Payer: Self-pay

## 2014-03-14 ENCOUNTER — Encounter: Payer: Self-pay | Admitting: Certified Nurse Midwife

## 2014-03-17 ENCOUNTER — Encounter: Payer: Self-pay | Admitting: Obstetrics & Gynecology

## 2014-03-24 ENCOUNTER — Ambulatory Visit: Payer: BC Managed Care – PPO | Admitting: Internal Medicine

## 2014-03-24 ENCOUNTER — Other Ambulatory Visit (INDEPENDENT_AMBULATORY_CARE_PROVIDER_SITE_OTHER): Payer: BC Managed Care – PPO

## 2014-03-24 ENCOUNTER — Ambulatory Visit (INDEPENDENT_AMBULATORY_CARE_PROVIDER_SITE_OTHER): Payer: BC Managed Care – PPO | Admitting: Internal Medicine

## 2014-03-24 ENCOUNTER — Encounter: Payer: Self-pay | Admitting: Internal Medicine

## 2014-03-24 VITALS — BP 130/60 | HR 73 | Temp 98.5°F | Resp 14 | Wt 232.5 lb

## 2014-03-24 DIAGNOSIS — Z86010 Personal history of colonic polyps: Secondary | ICD-10-CM

## 2014-03-24 DIAGNOSIS — R1013 Epigastric pain: Secondary | ICD-10-CM

## 2014-03-24 DIAGNOSIS — R195 Other fecal abnormalities: Secondary | ICD-10-CM

## 2014-03-24 DIAGNOSIS — R140 Abdominal distension (gaseous): Secondary | ICD-10-CM

## 2014-03-24 LAB — CBC WITH DIFFERENTIAL/PLATELET
BASOS ABS: 0 10*3/uL (ref 0.0–0.1)
Basophils Relative: 0.7 % (ref 0.0–3.0)
Eosinophils Absolute: 0.2 10*3/uL (ref 0.0–0.7)
Eosinophils Relative: 3.6 % (ref 0.0–5.0)
HCT: 44 % (ref 36.0–46.0)
Hemoglobin: 14.3 g/dL (ref 12.0–15.0)
LYMPHS ABS: 2.2 10*3/uL (ref 0.7–4.0)
LYMPHS PCT: 37.9 % (ref 12.0–46.0)
MCHC: 32.5 g/dL (ref 30.0–36.0)
MCV: 83.1 fl (ref 78.0–100.0)
Monocytes Absolute: 0.4 10*3/uL (ref 0.1–1.0)
Monocytes Relative: 6.2 % (ref 3.0–12.0)
Neutro Abs: 3 10*3/uL (ref 1.4–7.7)
Neutrophils Relative %: 51.6 % (ref 43.0–77.0)
PLATELETS: 234 10*3/uL (ref 150.0–400.0)
RBC: 5.3 Mil/uL — ABNORMAL HIGH (ref 3.87–5.11)
RDW: 13.3 % (ref 11.5–15.5)
WBC: 5.8 10*3/uL (ref 4.0–10.5)

## 2014-03-24 LAB — HEPATIC FUNCTION PANEL
ALBUMIN: 4 g/dL (ref 3.5–5.2)
ALK PHOS: 96 U/L (ref 39–117)
ALT: 23 U/L (ref 0–35)
AST: 19 U/L (ref 0–37)
BILIRUBIN TOTAL: 0.8 mg/dL (ref 0.2–1.2)
Bilirubin, Direct: 0.1 mg/dL (ref 0.0–0.3)
Total Protein: 8.3 g/dL (ref 6.0–8.3)

## 2014-03-24 LAB — AMYLASE: Amylase: 71 U/L (ref 27–131)

## 2014-03-24 LAB — LIPASE: Lipase: 27 U/L (ref 11.0–59.0)

## 2014-03-24 MED ORDER — OMEPRAZOLE 20 MG PO CPDR: 20.0000 mg | DELAYED_RELEASE_CAPSULE | Freq: Two times a day (BID) | ORAL | Status: DC

## 2014-03-24 NOTE — Progress Notes (Signed)
Pre visit review using our clinic review tool, if applicable. No additional management support is needed unless otherwise documented below in the visit note. 

## 2014-03-24 NOTE — Progress Notes (Signed)
   Subjective:    Patient ID: Kathleen Lucero, female    DOB: April 10, 1956, 58 y.o.   MRN: [id number]  HPI She has had recurrent intermittent abdominal pain over the last 2 weeks. She had exacerbation 2 weeks ago and again one week ago which resolved without treatment  There was recurrence 11/10 as diffuse upper abdominal cramping with bloating. She described a "constipated feeling" .She was not constipated as she had loose stool. She had associated dyspepsia,nausea and decreased appetite  There was no frank diarrhea  She had subjective fever, chills, sweats.  Tylenol,cola and ginger ale were of no benefit  Symptoms were worse sitting up  She has a past history of reflux.  Her last colonoscopy was 2011 by Dr. Collene Mares; she was to have a repeat 2014. This was not completed.She states "I'm waiting to meet my deductible". Her brother had colorectal cancer  She still has her uterus, ovaries, and appendix. Negative Gyn evaluation within past several weeks.      Review of Systems  Unexplained weight loss, dysphagia, melena, rectal bleeding, or persistently small caliber stools are denied.  Dysuria, pyuria, hematuria, frequency, nocturia or polyuria are denied.    Objective:   Physical Exam   Pertinent positive findings include: She is  tearful but oriented and communicative. She has complete dentures A grade 1 systolic murmur is noted at the base Bowel sounds are decreased.  General appearance :adequately nourished; in no distress. Eyes: No conjunctival inflammation or scleral icterus is present. Oral exam: Lips and gums are healthy appearing.There is no oropharyngeal erythema or exudate noted.  Heart:  Normal rate and regular rhythm. S1 and S2 normal without gallop, click, rub or other extra sounds   Lungs:Chest clear to auscultation; no wheezes, rhonchi,rales ,or rubs present.No increased work of breathing.  Abdomen:  soft and non-tender without masses, organomegaly or hernias  noted.  No guarding or rebound. No flank tenderness to percussion. Vascular : all pulses equal ; no bruits present. Skin:Warm & dry.  Intact without suspicious lesions or rashes ; no tenting Lymphatic: No lymphadenopathy is noted about the head, neck, axilla areas.               Assessment & Plan:  #1 upper abdominal pain probable reflux with esophagitis. Rule out pancreatitis (doubt)  #2bloating and loose stool suggestive of irritable bowel  #3 history of colon polyps and family history colorectal cancer. Colonoscopy surveillance overdue. Risk discussed  See orders and recommendations. H pylori studies & GI referral  if suboptimal response toPPI bid

## 2014-03-24 NOTE — Patient Instructions (Signed)
Reflux of gastric acid may be asymptomatic as this may occur mainly during sleep.The triggers for reflux  include stress; the "aspirin family" ; alcohol; peppermint; and caffeine (coffee, tea, cola, and chocolate). The aspirin family would include aspirin and the nonsteroidal agents such as ibuprofen &  Naproxen. Tylenol would not cause reflux. If having symptoms ; food & drink should be avoided for @ least 2 hours before going to bed.  Take the Omeprazole,protein pump inhibitor, 30 minutes before breakfast and 30 minutes before the evening meal for 8 weeks then go back to once a day  30 minutes before breakfast.     Please take a probiotic , Florastor OR Align, every day if the bowels are loose. This will replace the normal bacteria which  are necessary for formation of normal stool and processing of food.

## 2014-05-04 ENCOUNTER — Other Ambulatory Visit: Payer: Self-pay | Admitting: Internal Medicine

## 2014-05-30 ENCOUNTER — Other Ambulatory Visit: Payer: Self-pay | Admitting: Internal Medicine

## 2014-10-06 IMAGING — US US SOFT TISSUE HEAD/NECK
1 series · 14 of 25 positions shown · non-contrast
Comparison: CT HEAD W/O CM dated 10/03/2012; US SOFT TISSUE
HEAD/NECK dated 10/25/2010

CLINICAL DATA: Goiter

EXAM:
THYROID ULTRASOUND
TECHNIQUE: Ultrasound examination of the thyroid gland and adjacent soft
tissues was performed.

[Series 1: us soft tissue head/neck · 0.11mm/px · 14 of 103 slices shown]
[im 1/103]
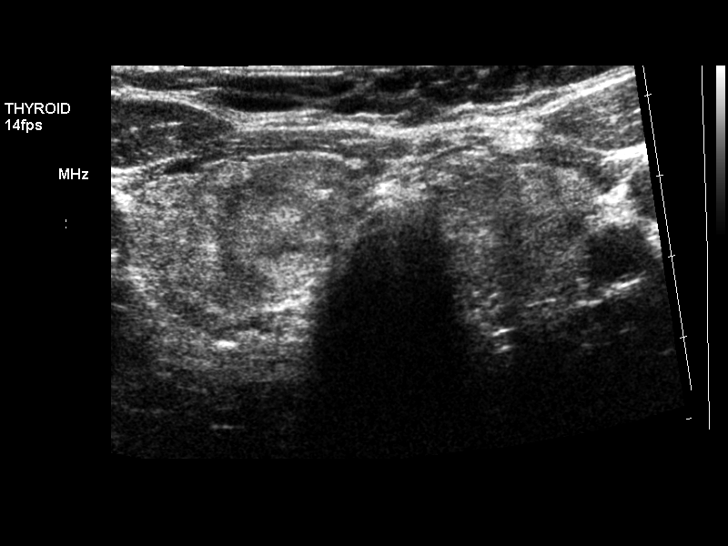
[im 9/103]
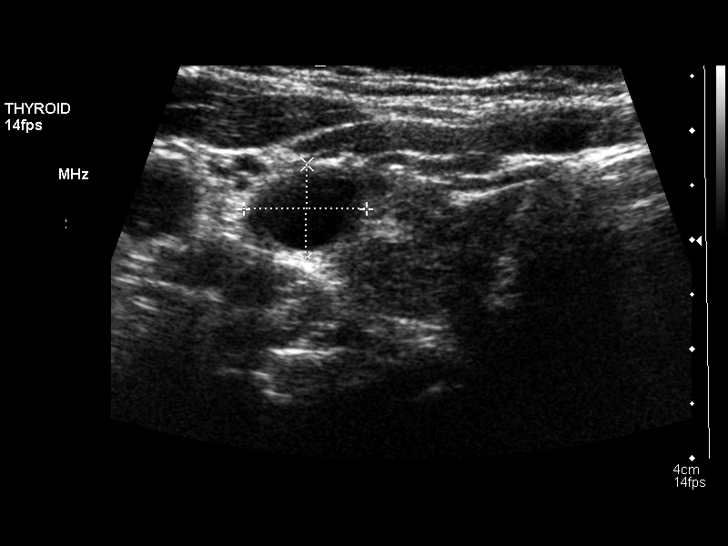
[im 18/103]
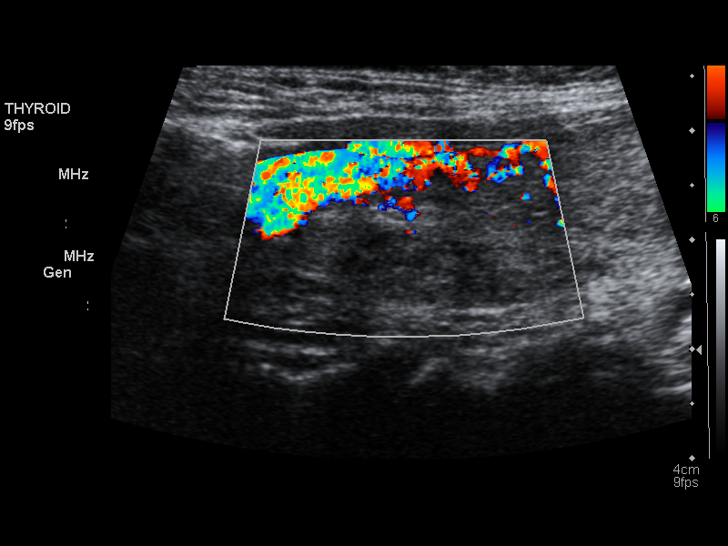
[im 26/103]
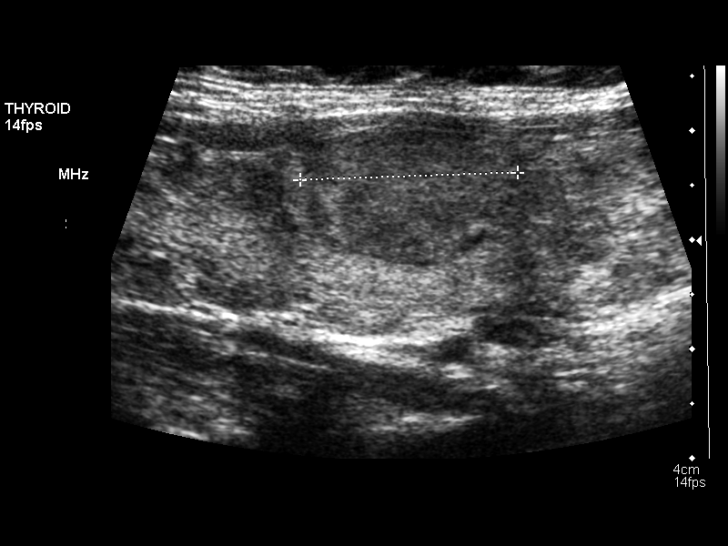
[im 35/103]
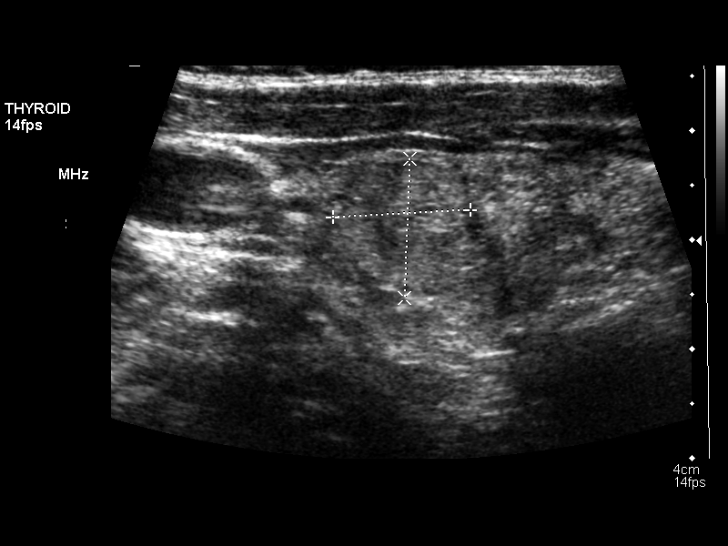
[im 39/103]
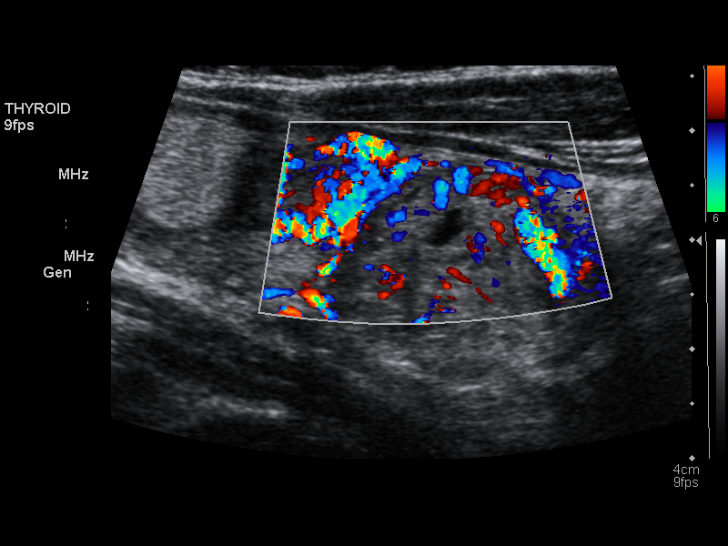
[im 47/103]
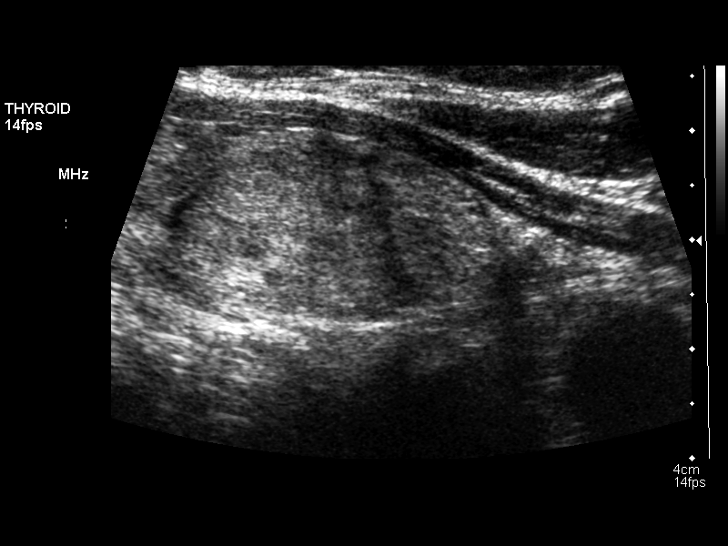
[im 56/103]
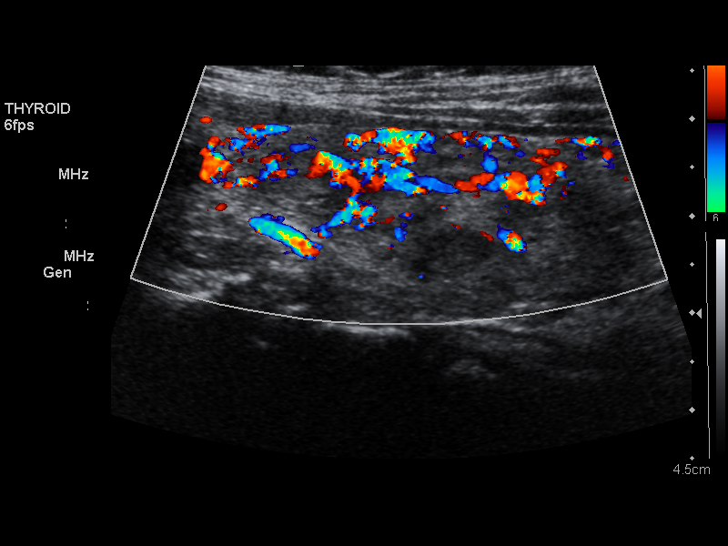
[im 64/103]
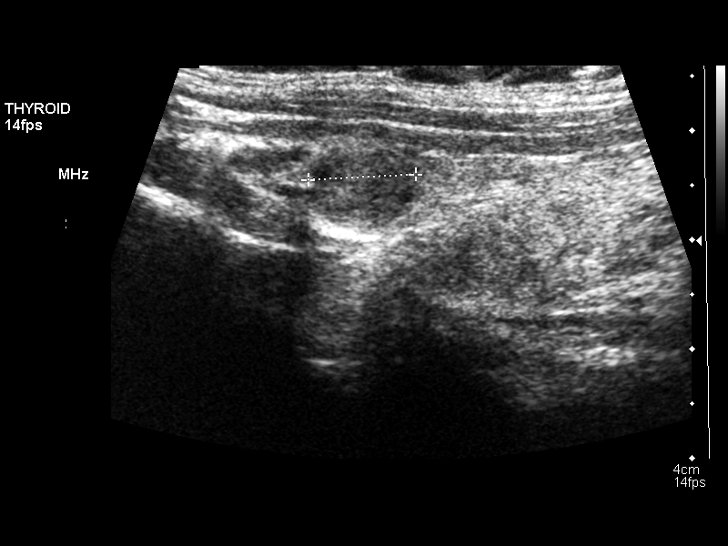
[im 69/103]
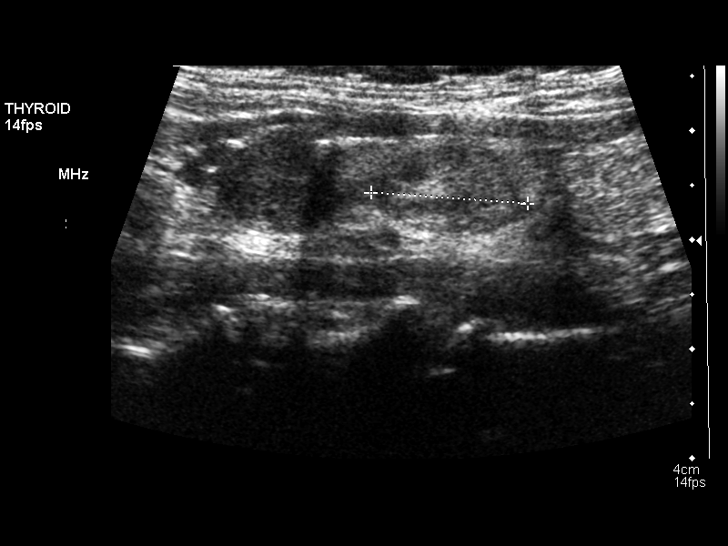
[im 77/103]
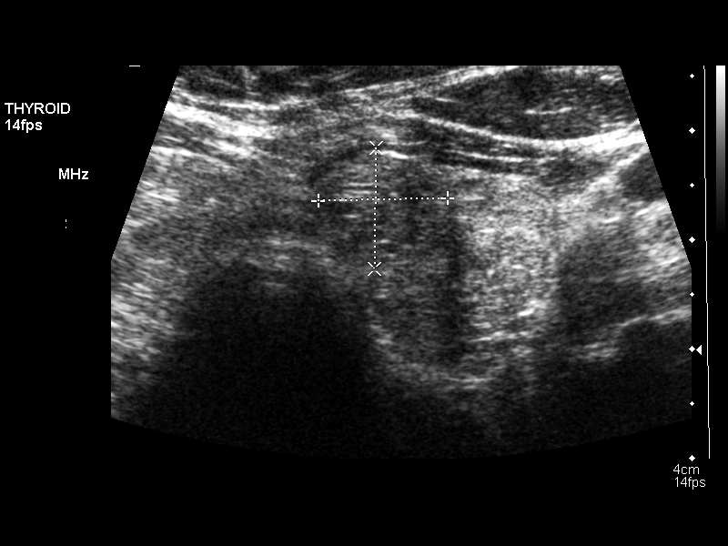
[im 86/103]
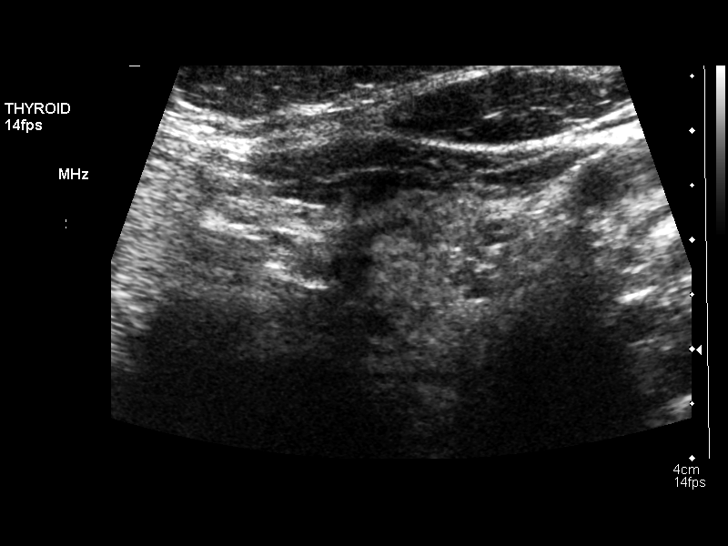
[im 94/103]
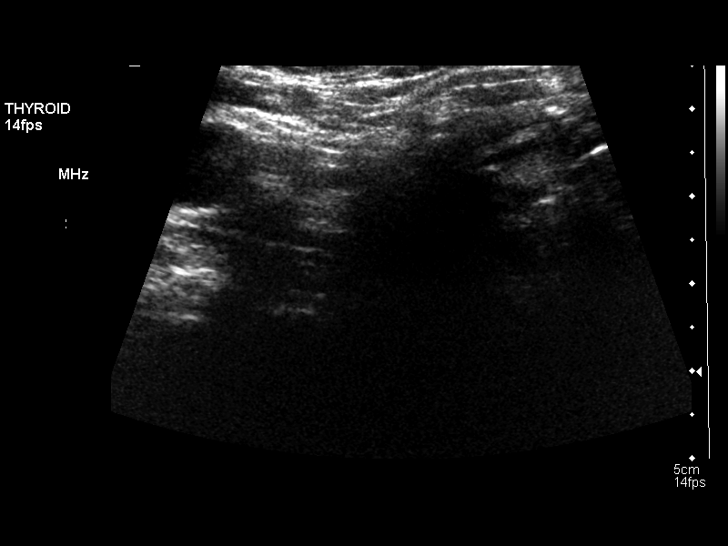
[im 103/103]
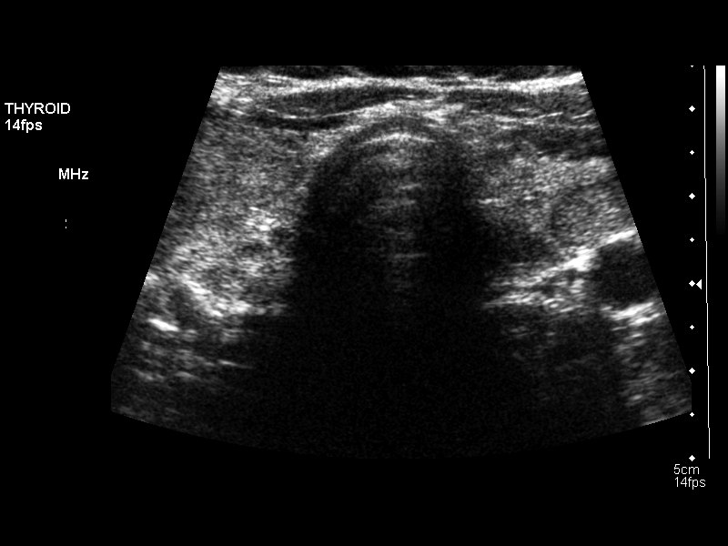

[14 of 25 positions shown; findings below may reference images not displayed]

FINDINGS: Right thyroid lobe

Measurements: 7.1 x 2.1 x 2.7 cm. Innumerable solid nodules are seen
throughout the right lobe. The largest nodules on the right measure
2.0 x 1.3 x 2.0 cm in the interpolar region, 2.1 x 1.7 x 2.2 cm in
the mid to lower pole, and a 2.0 x 1.3 x 2.0 cm nodule in the lower
pole. These nodules have all diminished in size compared to the
prior study.

Left thyroid lobe

Measurements: 6.7 x 2.0 x 2.1 cm. Innumerable nodules are scattered
throughout the left lobe. The largest measures 1.4 x 1.0 x 1.4 cm in
the upper pole. Previously, this measured 1.4 x 1.1 x 1.1 cm and is
smaller.

Isthmus

Thickness: 5 mm in thickness.  No nodules visualized.

Lymphadenopathy

None visualized.
IMPRESSION: Multiple bilateral nodules as described. The dominant nodules have
decreased in size bilaterally.

## 2015-01-10 ENCOUNTER — Other Ambulatory Visit: Payer: Self-pay | Admitting: Internal Medicine

## 2015-02-20 ENCOUNTER — Other Ambulatory Visit (INDEPENDENT_AMBULATORY_CARE_PROVIDER_SITE_OTHER): Payer: BLUE CROSS/BLUE SHIELD

## 2015-02-20 ENCOUNTER — Encounter: Payer: Self-pay | Admitting: Internal Medicine

## 2015-02-20 ENCOUNTER — Ambulatory Visit (INDEPENDENT_AMBULATORY_CARE_PROVIDER_SITE_OTHER): Payer: BLUE CROSS/BLUE SHIELD | Admitting: Internal Medicine

## 2015-02-20 VITALS — BP 138/88 | HR 72 | Temp 98.6°F | Ht 67.0 in | Wt 227.0 lb

## 2015-02-20 DIAGNOSIS — J309 Allergic rhinitis, unspecified: Secondary | ICD-10-CM

## 2015-02-20 DIAGNOSIS — R1013 Epigastric pain: Secondary | ICD-10-CM | POA: Diagnosis not present

## 2015-02-20 DIAGNOSIS — Z0000 Encounter for general adult medical examination without abnormal findings: Secondary | ICD-10-CM

## 2015-02-20 DIAGNOSIS — Z0001 Encounter for general adult medical examination with abnormal findings: Secondary | ICD-10-CM | POA: Diagnosis not present

## 2015-02-20 DIAGNOSIS — Z0189 Encounter for other specified special examinations: Secondary | ICD-10-CM | POA: Diagnosis not present

## 2015-02-20 LAB — LIPID PANEL
CHOLESTEROL: 167 mg/dL (ref 0–200)
HDL: 46 mg/dL (ref >39.00)
LDL Cholesterol: 105 mg/dL — ABNORMAL HIGH (ref 0–99)
NonHDL: 120.66
Total CHOL/HDL Ratio: 4
Triglycerides: 77 mg/dL (ref 0.0–149.0)
VLDL: 15.4 mg/dL (ref 0.0–40.0)

## 2015-02-20 LAB — CBC WITH DIFFERENTIAL/PLATELET
BASOS PCT: 0.7 % (ref 0.0–3.0)
Basophils Absolute: 0 10*3/uL (ref 0.0–0.1)
Eosinophils Absolute: 0.3 10*3/uL (ref 0.0–0.7)
Eosinophils Relative: 4.8 % (ref 0.0–5.0)
HCT: 44.2 % (ref 36.0–46.0)
HEMOGLOBIN: 14.2 g/dL (ref 12.0–15.0)
Lymphocytes Relative: 39.6 % (ref 12.0–46.0)
Lymphs Abs: 2.2 10*3/uL (ref 0.7–4.0)
MCHC: 32.1 g/dL (ref 30.0–36.0)
MCV: 83.8 fl (ref 78.0–100.0)
MONOS PCT: 6.3 % (ref 3.0–12.0)
Monocytes Absolute: 0.3 10*3/uL (ref 0.1–1.0)
Neutro Abs: 2.7 10*3/uL (ref 1.4–7.7)
Neutrophils Relative %: 48.6 % (ref 43.0–77.0)
Platelets: 225 10*3/uL (ref 150.0–400.0)
RBC: 5.27 Mil/uL — AB (ref 3.87–5.11)
RDW: 13.6 % (ref 11.5–15.5)
WBC: 5.6 10*3/uL (ref 4.0–10.5)

## 2015-02-20 LAB — BASIC METABOLIC PANEL
BUN: 16 mg/dL (ref 6–23)
CHLORIDE: 106 meq/L (ref 96–112)
CO2: 27 mEq/L (ref 19–32)
Calcium: 10.4 mg/dL (ref 8.4–10.5)
Creatinine, Ser: 1.11 mg/dL (ref 0.40–1.20)
GFR: 64.9 mL/min (ref >60.00)
Glucose, Bld: 108 mg/dL — ABNORMAL HIGH (ref 70–99)
Potassium: 4.2 mEq/L (ref 3.5–5.1)
SODIUM: 141 meq/L (ref 135–145)

## 2015-02-20 LAB — HEPATIC FUNCTION PANEL
ALBUMIN: 4.6 g/dL (ref 3.5–5.2)
ALT: 19 U/L (ref 0–35)
AST: 18 U/L (ref 0–37)
Alkaline Phosphatase: 96 U/L (ref 39–117)
Bilirubin, Direct: 0.1 mg/dL (ref 0.0–0.3)
Total Bilirubin: 0.5 mg/dL (ref 0.2–1.2)
Total Protein: 8.2 g/dL (ref 6.0–8.3)

## 2015-02-20 LAB — TSH: TSH: 1.35 u[IU]/mL (ref 0.35–4.50)

## 2015-02-20 MED ORDER — ROSUVASTATIN CALCIUM 20 MG PO TABS: 20.0000 mg | ORAL_TABLET | Freq: Every day | ORAL | Status: DC

## 2015-02-20 MED ORDER — OMEPRAZOLE 20 MG PO CPDR: 20.0000 mg | DELAYED_RELEASE_CAPSULE | Freq: Two times a day (BID) | ORAL | Status: DC

## 2015-02-20 MED ORDER — DILTIAZEM HCL ER COATED BEADS 180 MG PO CP24: 180.0000 mg | ORAL_CAPSULE | Freq: Every day | ORAL | Status: DC

## 2015-02-20 MED ORDER — SPIRONOLACTONE 25 MG PO TABS: 25.0000 mg | ORAL_TABLET | Freq: Two times a day (BID) | ORAL | Status: DC

## 2015-02-20 NOTE — Progress Notes (Signed)
   Subjective:    Patient ID: Kathleen Lucero, female    DOB: 06-Nov-1955, 59 y.o.   MRN: [id number]  HPI The patient is here for a physical to assess status of active health conditions.  PMH, FH, & Social History reviewed & updated.No change in [place] as recorded.  For the last 4 weeks she describes extrinsic symptoms of itchy, watery eyes as well as sneezing. She has a nonproductive cough. She has no symptoms of an upper respiratory tract infection.  She's never smoked and does not drink alcohol. She's been compliant with her medicines without adverse effects.  She had a colonoscopy in 2011. She's been notified that she due again. Her brother did have colon cancer.  She's overdue for follow-up with her Endocrinologist in reference to the nontoxic multinodular goiter  Review of Systems Frontal headache, facial pain , nasal purulence, dental pain, sore throat , otic pain or otic discharge denied. No fever , chills or sweats. Chest pain, palpitations, tachycardia, exertional dyspnea, paroxysmal nocturnal dyspnea, claudication or edema are absent. No unexplained weight loss, abdominal pain, significant dyspepsia, dysphagia, melena, rectal bleeding, or persistently small caliber stools. Dysuria, pyuria, hematuria, frequency, nocturia or polyuria are denied. Change in hair, skin, nails denied. No bowel changes of constipation or diarrhea. No intolerance to heat or cold.     Objective:   Physical Exam  Pertinent or positive findings include: She has complete dentures. The right thyroid lobe is full without nodularity. She has a grade 1/2 systolic murmur; second heart sound is increased. She has slight crepitus of the knees.  General appearance :adequately nourished; in no distress.BMI 35.55.  Eyes: No conjunctival inflammation or scleral icterus is present.  Oral exam:  Lips and gums are healthy appearing.There is no oropharyngeal erythema or exudate noted.   Heart:  Normal rate and  regular rhythm. S1 normal without gallop, murmur, click, rub or other extra sounds    Lungs:Chest clear to auscultation; no wheezes, rhonchi,rales ,or rubs present.No increased work of breathing.   Abdomen: bowel sounds normal, soft and non-tender without masses, organomegaly or hernias noted.  No guarding or rebound. No flank tenderness to percussion.  Vascular : all pulses equal ; no bruits present.  Skin:Warm & dry.  Intact without suspicious lesions or rashes ; no tenting    Lymphatic: No lymphadenopathy is noted about the head, neck, axilla.   Neuro: Strength, tone & DTRs normal.     Assessment & Plan:  #1 comprehensive physical exam; no acute findings  #2 extrinsic rhinoconjunctivitis  Plan: see Orders  & Recommendations

## 2015-02-20 NOTE — Patient Instructions (Signed)
  Your next office appointment will be determined based upon review of your pending labs.  Those written interpretation of the lab results and instructions will be transmitted to you by My Chart Critical results will be called.   Followup as needed for any active or acute issue. Please report any significant change in your symptoms.   Plain Mucinex (NOT D) for thick secretions ;force NON dairy fluids .   Nasal cleansing in the shower as discussed with lather of mild shampoo.After 10 seconds wash off lather while  exhaling through nostrils. Make sure that all residual soap is removed to prevent irritation.  Flonase OR Nasacort AQ 1 spray in each nostril twice a day as needed. Use the "crossover" technique into opposite nostril spraying toward opposite ear @ 45 degree angle, not straight up into nostril.  Plain Allegra (NOT D )  160 daily , Loratidine 10 mg , OR Zyrtec 10 mg @ bedtime  as needed for itchy eyes & sneezing.  

## 2015-02-21 ENCOUNTER — Other Ambulatory Visit (INDEPENDENT_AMBULATORY_CARE_PROVIDER_SITE_OTHER): Payer: BLUE CROSS/BLUE SHIELD

## 2015-02-21 DIAGNOSIS — R739 Hyperglycemia, unspecified: Secondary | ICD-10-CM | POA: Diagnosis not present

## 2015-02-21 LAB — HEMOGLOBIN A1C: HEMOGLOBIN A1C: 6.3 % (ref 4.6–6.5)

## 2015-03-09 ENCOUNTER — Other Ambulatory Visit: Payer: Self-pay | Admitting: Internal Medicine

## 2015-03-21 ENCOUNTER — Other Ambulatory Visit: Payer: Self-pay | Admitting: Internal Medicine

## 2015-03-28 ENCOUNTER — Encounter: Payer: Self-pay | Admitting: Obstetrics & Gynecology

## 2015-03-28 ENCOUNTER — Ambulatory Visit (INDEPENDENT_AMBULATORY_CARE_PROVIDER_SITE_OTHER): Payer: BLUE CROSS/BLUE SHIELD | Admitting: Obstetrics & Gynecology

## 2015-03-28 VITALS — BP 130/82 | HR 68 | Resp 14 | Ht 66.5 in | Wt 232.0 lb

## 2015-03-28 DIAGNOSIS — D251 Intramural leiomyoma of uterus: Secondary | ICD-10-CM

## 2015-03-28 DIAGNOSIS — Z01419 Encounter for gynecological examination (general) (routine) without abnormal findings: Secondary | ICD-10-CM | POA: Diagnosis not present

## 2015-03-28 DIAGNOSIS — Z0000 Encounter for general adult medical examination without abnormal findings: Secondary | ICD-10-CM

## 2015-03-28 LAB — POCT URINALYSIS DIPSTICK
BILIRUBIN UA: NEGATIVE
Glucose, UA: NEGATIVE
KETONES UA: NEGATIVE
LEUKOCYTES UA: NEGATIVE
Nitrite, UA: NEGATIVE
PH UA: 7
Protein, UA: NEGATIVE
Urobilinogen, UA: NEGATIVE

## 2015-03-28 NOTE — Progress Notes (Signed)
59 y.o. [id number] MarriedAfrican AmericanF here for annual exam.  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  This has been very stressful for her.    Denies vaginal bleeding.     PCP:  Dr. Linna Darner.  He is retiring.  She will be changing providers this he.  She has some recommendations.  Last HbA1c was 6.4.    Patient's last menstrual period was 05/14/2007.          Sexually active: No.  The current method of family planning is post menopausal status.    Exercising: Yes.    walking Smoker:  no  Health Maintenance: Pap:  02/14/14 Neg Pap, neg pap with neg HR HPV 9/13 History of abnormal Pap:  no MMG:  Done 03/23/15 at [facility] Colonoscopy:  9/11 with Dr. Collene Mares.  KNOWS it is due.   BMD:   2008  TDaP: unsure Screening Labs: PCP, Hb today: PCP, Urine today: n/a   reports that she has never smoked. She has never used smokeless tobacco. She reports that she does not drink alcohol or use illicit drugs.  Past Medical History  Diagnosis Date  . Hypertension   . Hyperlipidemia   . Heart murmur   . Goiter   . Uterine fibroid     Dr Sabra Heck  . GERD (gastroesophageal reflux disease) 2007  . T3 thyrotoxicosis 2011    S/P RAI  . Degenerative disc disease     S/P ruptured disc, Dr. Lindwood Qua    Past Surgical History  Procedure Laterality Date  . Wisdom tooth extraction    . Colonoscopy with polypectomy  2011    Dr Floria Raveling done 2013 as recommended  . Tubal ligation  1981    Current Outpatient Prescriptions  Medication Sig Dispense Refill  . diltiazem (CARDIZEM CD) 180 MG 24 hr capsule Take 1 capsule (180 mg total) by mouth daily. 90 capsule 3  . fluticasone (FLONASE) 50 MCG/ACT nasal spray Place 2 sprays into both nostrils daily. 16 g 6  . omeprazole (PRILOSEC) 20 MG capsule Take 1 capsule (20 mg total) by mouth 2 (two) times daily before a meal. 180 capsule 3  . rosuvastatin (CRESTOR) 20 MG tablet Take 1 tablet (20 mg total) by mouth daily. 90 tablet 3  .  spironolactone (ALDACTONE) 25 MG tablet Take 1 tablet (25 mg total) by mouth 2 (two) times daily. 180 tablet 3   No current facility-administered medications for this visit.    Family History  Problem Relation Age of Onset  . Hypertension Mother   . Stroke Mother 35    5 days post partum  . Hypertension Brother      2 brothers  . Cancer Brother 74    colorectal  . Hypertension Maternal Grandmother   . Thyroid disease Maternal Grandmother     hypothyroidism  . Diabetes Maternal Grandmother     diet controlled  . Dementia Maternal Grandmother 70  . Alzheimer's disease Maternal Grandmother   . Heart attack Neg Hx   . Dementia Paternal Aunt     ROS:  Pertinent items are noted in HPI.  Otherwise, a comprehensive ROS was negative.  Exam:   BP 130/82 mmHg  Pulse 68  Resp 14  Ht 5' 6.5" (1.689 m)  Wt 232 lb (105.235 kg)  BMI 36.89 kg/m2  LMP 05/14/2007  Weight change:+1   Height: 5' 6.5" (168.9 cm)  Ht Readings from Last 3 Encounters:  03/28/15 5' 6.5" (1.689 m)  02/20/15 5'  7" (1.702 m)  02/14/14 5' 6.75" (1.695 m)    General appearance: alert, cooperative and appears stated age Head: Normocephalic, without obvious abnormality, atraumatic Neck: no adenopathy, supple, symmetrical, trachea midline and thyroid symmetrically enlarged Lungs: clear to auscultation bilaterally Breasts: normal appearance, no masses or tenderness Heart: regular rate and rhythm Abdomen: soft, non-tender; bowel sounds normal; no masses,  no organomegaly Extremities: extremities normal, atraumatic, no cyanosis or edema Skin: Skin color, texture, turgor normal. No rashes or lesions Lymph nodes: Cervical, supraclavicular, and axillary nodes normal. No abnormal inguinal nodes palpated Neurologic: Grossly normal   Pelvic: External genitalia:  no lesions              Urethra:  normal appearing urethra with no masses, tenderness or lesions              Bartholins and Skenes: normal                  Vagina: normal appearing vagina with normal color and discharge, no lesions              Cervix: no lesions              Pap taken: No. Bimanual Exam:  Uterus:  enlarged, 8-10 weeks weeks size, globular and stable in size              Adnexa: normal adnexa and no mass, fullness, tenderness               Rectovaginal: Confirms               Anus:  normal sphincter tone, no lesions  Chaperone was present for exam.  A:  Well Woman with normal exam Post menopuasal no HRT Enlarged uterus with known hx of fibroid uterus.  Stable exam.  Last ultrasound 8/11. Hypertension Elevated lipids Social stressors Enlarged thyroid with multiple nodules, s/p RAIU Personal hx of colonic polyps, brother with hx of colon cancer  P: Mammogram just done last week Labs/vaccine with PCP Pt aware colonoscopy is DUE Last thyroid ultrasound 2/15 No pap today.  Neg pap 2015.  Neg pap with neg HR HPV 2013.   AEX 1 year or follow up prn

## 2015-03-28 NOTE — Patient Instructions (Addendum)
Miralax.  1 scoop in 4 oz of water.   Can also use Colace 100mg  twice daily as well.

## 2015-05-25 ENCOUNTER — Encounter: Payer: Self-pay | Admitting: *Deleted

## 2015-05-25 ENCOUNTER — Ambulatory Visit (INDEPENDENT_AMBULATORY_CARE_PROVIDER_SITE_OTHER): Payer: BLUE CROSS/BLUE SHIELD | Admitting: Family Medicine

## 2015-05-25 ENCOUNTER — Encounter: Payer: Self-pay | Admitting: Family Medicine

## 2015-05-25 VITALS — BP 104/80 | HR 99 | Temp 98.3°F | Ht 66.5 in | Wt 233.7 lb

## 2015-05-25 DIAGNOSIS — B349 Viral infection, unspecified: Secondary | ICD-10-CM | POA: Diagnosis not present

## 2015-05-25 MED ORDER — BENZONATATE 100 MG PO CAPS: 100.0000 mg | ORAL_CAPSULE | Freq: Three times a day (TID) | ORAL | Status: DC | PRN

## 2015-05-25 NOTE — Progress Notes (Signed)
HPI:  Acute visit for:  URI: -started: 3-4 days ago -symptoms:nasal congestion, sore throat, cough, diarrhea and vomiting the first day - now resolved, ? Fever the first day - now resolved > 24 hours -denies:fever, SOB, NVD, tooth pain -sick contacts/travel/risks: denies flu exposure, travel, abx - she did have several co-workers with similar symptoms -declines flu shot  ROS: See pertinent positives and negatives per HPI.  Past Medical History  Diagnosis Date  . Hypertension   . Hyperlipidemia   . Heart murmur   . Goiter   . Uterine fibroid     Dr Sabra Heck  . GERD (gastroesophageal reflux disease) 2007  . T3 thyrotoxicosis 2011    S/P RAI  . Degenerative disc disease     S/P ruptured disc, Dr. Lindwood Qua    Past Surgical History  Procedure Laterality Date  . Wisdom tooth extraction    . Colonoscopy with polypectomy  2011    Dr Floria Raveling done 2013 as recommended  . Tubal ligation  1981    Family History  Problem Relation Age of Onset  . Hypertension Mother   . Stroke Mother 35    5 days post partum  . Hypertension Brother      2 brothers  . Cancer Brother 41    colorectal  . Hypertension Maternal Grandmother   . Thyroid disease Maternal Grandmother     hypothyroidism  . Diabetes Maternal Grandmother     diet controlled  . Dementia Maternal Grandmother 34  . Alzheimer's disease Maternal Grandmother   . Heart attack Neg Hx   . Dementia Paternal Aunt     Social History   Social History  . Marital Status: Married    Spouse Name: N/A  . Number of Children: N/A  . Years of Education: N/A   Social History Main Topics  . Smoking status: Never Smoker   . Smokeless tobacco: Never Used  . Alcohol Use: No  . Drug Use: No  . Sexual Activity: No     Comment: BTL   Other Topics Concern  . None   Social History Narrative   Walks 2X wkly     Current outpatient prescriptions:  .  diltiazem (CARDIZEM CD) 180 MG 24 hr capsule, Take 1 capsule (180 mg  total) by mouth daily., Disp: 90 capsule, Rfl: 3 .  fluticasone (FLONASE) 50 MCG/ACT nasal spray, Place 2 sprays into both nostrils daily., Disp: 16 g, Rfl: 6 .  omeprazole (PRILOSEC) 20 MG capsule, Take 1 capsule (20 mg total) by mouth 2 (two) times daily before a meal., Disp: 180 capsule, Rfl: 3 .  rosuvastatin (CRESTOR) 20 MG tablet, Take 1 tablet (20 mg total) by mouth daily., Disp: 90 tablet, Rfl: 3 .  spironolactone (ALDACTONE) 25 MG tablet, Take 1 tablet (25 mg total) by mouth 2 (two) times daily., Disp: 180 tablet, Rfl: 3 .  benzonatate (TESSALON PERLES) 100 MG capsule, Take 1 capsule (100 mg total) by mouth 3 (three) times daily as needed for cough., Disp: 20 capsule, Rfl: 0  EXAM:  Filed Vitals:   05/25/15 1514  BP: 104/80  Pulse: 99  Temp: 98.3 F (36.8 C)    Body mass index is 37.16 kg/(m^2).  GENERAL: vitals reviewed and listed above, alert, oriented, appears well hydrated and in no acute distress  HEENT: atraumatic, conjunttiva clear, no obvious abnormalities on inspection of external nose and ears, normal appearance of ear canals and TMs, clear nasal congestion, mild post oropharyngeal erythema with PND, no  tonsillar edema or exudate, no sinus TTP  NECK: no obvious masses on inspection  LUNGS: clear to auscultation bilaterally, no wheezes, rales or rhonchi, good air movement  CV: HRRR, no peripheral edema  MS: moves all extremities without noticeable abnormality  PSYCH: pleasant and cooperative, no obvious depression or anxiety  ASSESSMENT AND PLAN:  Discussed the following assessment and plan:  Viral infection  -given HPI and exam findings today, a serious infection or illness is unlikely. We discussed potential etiologies, with Viral illness or mild influenza being most likely. Given is resolving, no concerning symptoms today and no fever and duration of illness advised supportive care and monitoring. Tessalon for cough. Offered flu test, but given likely would  not treat she declined. We discussed treatment side effects, likely course, antibiotic misuse, transmission, and signs of developing a serious illness. -of course, we advised to return or notify a doctor immediately if symptoms worsen or persist or new concerns arise.    Patient Instructions  -take the cough medication as needed for the cough  -no dairy for 1  week  -plenty of fluids  -nasal saline wash 2-3 times daily (use prepackaged nasal saline or bottled/distilled water if making your own)   -can use AFRIN nasal spray for drainage and nasal congestion - but do NOT use longer then 3-4 days  -can use tylenol (in no history of liver disease) or ibuprofen (if no history of kidney disease, bowel bleeding or significant heart disease) as directed for aches and sorethroat  -in the winter time, using a humidifier at night is helpful (please follow cleaning instructions)  -if you are taking a cough medication - use only as directed, may also try a teaspoon of honey to coat the throat and throat lozenges. If given a cough medication with codeine or hydrocodone or other narcotic please be advised that this contains a strong and  potentially addicting medication. Please follow instructions carefully, take as little as possible and only use AS NEEDED for severe cough. Discuss potential side effects with your pharmacy. Please do not drive or operate machinery while taking these types of medications. Please do not take other sedating medications, drugs or alcohol while taking this medication without discussing with your doctor.  -for sore throat, salt water gargles can help  -follow up if you have fevers, facial pain, tooth pain, difficulty breathing or are worsening or symptoms persist longer then expected  Upper Respiratory Infection, Adult An upper respiratory infection (URI) is also known as the common cold. It is often caused by a type of germ (virus). Colds are easily spread (contagious). You  can pass it to others by kissing, coughing, sneezing, or drinking out of the same glass. Usually, you get better in 1 to 3  weeks.  However, the cough can last for even longer. HOME CARE   Only take medicine as told by your doctor. Follow instructions provided above.  Drink enough water and fluids to keep your pee (urine) clear or pale yellow.  Get plenty of rest.  Return to work when your temperature is < 100 for 24 hours or as told by your doctor. You may use a face mask and wash your hands to stop your cold from spreading. GET HELP RIGHT AWAY IF:   After the first few days, you feel you are getting worse.  You have questions about your medicine.  You have chills, shortness of breath, or red spit (mucus).  You have pain in the face for more then  1-2 days, especially when you bend forward.  You have a fever, puffy (swollen) neck, pain when you swallow, or white spots in the back of your throat.  You have a bad headache, ear pain, sinus pain, or chest pain.  You have a high-pitched whistling sound when you breathe in and out (wheezing).  You cough up blood.  You have sore muscles or a stiff neck. MAKE SURE YOU:   Understand these instructions.  Will watch your condition.  Will get help right away if you are not doing well or get worse. Document Released: 10/16/2007 Document Revised: 07/22/2011 Document Reviewed: 08/04/2013 [facility] Patient Information 2015 [place], [place]. This information is not intended to replace advice given to you by your health care provider. Make sure you discuss any questions you have with your health care provider.      Colin Benton R.

## 2015-05-25 NOTE — Progress Notes (Signed)
Pre visit review using our clinic review tool, if applicable. No additional management support is needed unless otherwise documented below in the visit note. 

## 2015-05-25 NOTE — Patient Instructions (Signed)
-  take the cough medication as needed for the cough  -no dairy for 1  week  -plenty of fluids  -nasal saline wash 2-3 times daily (use prepackaged nasal saline or bottled/distilled water if making your own)   -can use AFRIN nasal spray for drainage and nasal congestion - but do NOT use longer then 3-4 days  -can use tylenol (in no history of liver disease) or ibuprofen (if no history of kidney disease, bowel bleeding or significant heart disease) as directed for aches and sorethroat  -in the winter time, using a humidifier at night is helpful (please follow cleaning instructions)  -if you are taking a cough medication - use only as directed, may also try a teaspoon of honey to coat the throat and throat lozenges. If given a cough medication with codeine or hydrocodone or other narcotic please be advised that this contains a strong and  potentially addicting medication. Please follow instructions carefully, take as little as possible and only use AS NEEDED for severe cough. Discuss potential side effects with your pharmacy. Please do not drive or operate machinery while taking these types of medications. Please do not take other sedating medications, drugs or alcohol while taking this medication without discussing with your doctor.  -for sore throat, salt water gargles can help  -follow up if you have fevers, facial pain, tooth pain, difficulty breathing or are worsening or symptoms persist longer then expected  Upper Respiratory Infection, Adult An upper respiratory infection (URI) is also known as the common cold. It is often caused by a type of germ (virus). Colds are easily spread (contagious). You can pass it to others by kissing, coughing, sneezing, or drinking out of the same glass. Usually, you get better in 1 to 3  weeks.  However, the cough can last for even longer. HOME CARE   Only take medicine as told by your doctor. Follow instructions provided above.  Drink enough water and  fluids to keep your pee (urine) clear or pale yellow.  Get plenty of rest.  Return to work when your temperature is < 100 for 24 hours or as told by your doctor. You may use a face mask and wash your hands to stop your cold from spreading. GET HELP RIGHT AWAY IF:   After the first few days, you feel you are getting worse.  You have questions about your medicine.  You have chills, shortness of breath, or red spit (mucus).  You have pain in the face for more then 1-2 days, especially when you bend forward.  You have a fever, puffy (swollen) neck, pain when you swallow, or white spots in the back of your throat.  You have a bad headache, ear pain, sinus pain, or chest pain.  You have a high-pitched whistling sound when you breathe in and out (wheezing).  You cough up blood.  You have sore muscles or a stiff neck. MAKE SURE YOU:   Understand these instructions.  Will watch your condition.  Will get help right away if you are not doing well or get worse. Document Released: 10/16/2007 Document Revised: 07/22/2011 Document Reviewed: 08/04/2013 [facility] Patient Information 2015 [place], [place]. This information is not intended to replace advice given to you by your health care provider. Make sure you discuss any questions you have with your health care provider.

## 2015-07-14 ENCOUNTER — Encounter: Payer: Self-pay | Admitting: Internal Medicine

## 2015-07-14 ENCOUNTER — Ambulatory Visit (INDEPENDENT_AMBULATORY_CARE_PROVIDER_SITE_OTHER): Payer: BLUE CROSS/BLUE SHIELD | Admitting: Internal Medicine

## 2015-07-14 VITALS — BP 128/88 | HR 80 | Temp 98.2°F | Resp 16 | Wt 237.0 lb

## 2015-07-14 DIAGNOSIS — K219 Gastro-esophageal reflux disease without esophagitis: Secondary | ICD-10-CM

## 2015-07-14 DIAGNOSIS — E785 Hyperlipidemia, unspecified: Secondary | ICD-10-CM

## 2015-07-14 DIAGNOSIS — R1013 Epigastric pain: Secondary | ICD-10-CM | POA: Diagnosis not present

## 2015-07-14 DIAGNOSIS — I10 Essential (primary) hypertension: Secondary | ICD-10-CM

## 2015-07-14 DIAGNOSIS — R7303 Prediabetes: Secondary | ICD-10-CM | POA: Insufficient documentation

## 2015-07-14 MED ORDER — SPIRONOLACTONE 25 MG PO TABS: 25.0000 mg | ORAL_TABLET | Freq: Two times a day (BID) | ORAL | Status: DC

## 2015-07-14 MED ORDER — ROSUVASTATIN CALCIUM 20 MG PO TABS: 20.0000 mg | ORAL_TABLET | Freq: Every day | ORAL | Status: DC

## 2015-07-14 MED ORDER — OMEPRAZOLE 20 MG PO CPDR: 20.0000 mg | DELAYED_RELEASE_CAPSULE | Freq: Two times a day (BID) | ORAL | Status: DC

## 2015-07-14 MED ORDER — DILTIAZEM HCL ER COATED BEADS 180 MG PO CP24: 180.0000 mg | ORAL_CAPSULE | Freq: Every day | ORAL | Status: DC

## 2015-07-14 NOTE — Patient Instructions (Signed)
  Have your blood work done one week prior to your appointment in 6 months - you do need to fast.    Medications reviewed and updated.  No changes recommended at this time.  Your prescription(s) have been submitted to your pharmacy. Please take as directed and contact our office if you believe you are having problem(s) with the medication(s).   Please schedule followup in 6 months

## 2015-07-14 NOTE — Assessment & Plan Note (Signed)
Controlled Continue omeprazole daily Work on lifestyle changes

## 2015-07-14 NOTE — Assessment & Plan Note (Signed)
Stressed lifestyle changes -- low sodium diet, low carb diet, start exercise, work on weight loss Check a1c every 6 months

## 2015-07-14 NOTE — Assessment & Plan Note (Signed)
S/p RAI Not on treatment Check tsh every 6 months

## 2015-07-14 NOTE — Assessment & Plan Note (Signed)
BP controlled Continue current medication  

## 2015-07-14 NOTE — Progress Notes (Signed)
Pre visit review using our clinic review tool, if applicable. No additional management support is needed unless otherwise documented below in the visit note. 

## 2015-07-14 NOTE — Progress Notes (Signed)
Subjective:    Patient ID: Kathleen Lucero, female    DOB: [birth year], 60 y.o.   MRN: [id number]  HPI She is here to establish with a new pcp.    Her toenails have been cracking.    Hypertension: She is taking her medication daily. She is compliant with a low sodium diet.  She denies chest pain, palpitations, edema, shortness of breath and regular headaches. She is not exercising regularly.  She does monitor her blood pressure at home and it usually runs lower.    Hyperlipidemia: She is taking her medication daily. She is compliant with a low fat/cholesterol diet. She is not exercising regularly. She denies myalgias.   GERD:  She is taking her medication daily as prescribed.  She denies any GERD symptoms and feels her GERD is well controlled.   Prediabetes:  She is not compliant with a low sugar/carb diet.  She does not exercise.   She drinks regular soda, 2-3 a day, but has not drank them for one week and drinks sweet tea, 1 cup a day.  She drinks OJ every morning.   Medications and allergies reviewed with patient and updated if appropriate.  Patient Active Problem List   Diagnosis Date Noted  . Fibroids, intramural 03/28/2015  . Obesity (BMI 30-39.9) 11/20/2013  . History of colonic polyps 11/19/2013  . Nonspecific abnormal electrocardiogram (ECG) (EKG) 10/04/2011  . HEADACHE 04/24/2010  . HYPERTHYROIDISM 11/02/2008  . GOITER, MULTINODULAR 09/13/2008  . GERD 08/30/2008  . [place] DISEASE, LUMBOSACRAL SPINE 08/30/2008  . FASTING HYPERGLYCEMIA 08/30/2008  . HYPERLIPIDEMIA NEC/NOS 11/05/2006  . HYPERTENSION, ESSENTIAL NOS 11/05/2006    Current Outpatient Prescriptions on File Prior to Visit  Medication Sig Dispense Refill  . benzonatate (TESSALON PERLES) 100 MG capsule Take 1 capsule (100 mg total) by mouth 3 (three) times daily as needed for cough. 20 capsule 0  . diltiazem (CARDIZEM CD) 180 MG 24 hr capsule Take 1 capsule (180 mg total) by mouth daily. 90 capsule 3  .  fluticasone (FLONASE) 50 MCG/ACT nasal spray Place 2 sprays into both nostrils daily. 16 g 6  . omeprazole (PRILOSEC) 20 MG capsule Take 1 capsule (20 mg total) by mouth 2 (two) times daily before a meal. 180 capsule 3  . rosuvastatin (CRESTOR) 20 MG tablet Take 1 tablet (20 mg total) by mouth daily. 90 tablet 3  . spironolactone (ALDACTONE) 25 MG tablet Take 1 tablet (25 mg total) by mouth 2 (two) times daily. 180 tablet 3   No current facility-administered medications on file prior to visit.    Past Medical History  Diagnosis Date  . Hypertension   . Hyperlipidemia   . Heart murmur   . Goiter   . Uterine fibroid     Dr Sabra Heck  . GERD (gastroesophageal reflux disease) 2007  . T3 thyrotoxicosis 2011    S/P RAI  . Degenerative disc disease     S/P ruptured disc, Dr. Lindwood Qua    Past Surgical History  Procedure Laterality Date  . Wisdom tooth extraction    . Colonoscopy with polypectomy  2011    Dr Floria Raveling done 2013 as recommended  . Tubal ligation  1981    Social History   Social History  . Marital Status: Married    Spouse Name: N/A  . Number of Children: N/A  . Years of Education: N/A   Social History Main Topics  . Smoking status: Never Smoker   . Smokeless tobacco: Never Used  .  Alcohol Use: No  . Drug Use: No  . Sexual Activity: No     Comment: BTL   Other Topics Concern  . Not on file   Social History Narrative   Walks 2X wkly    Family History  Problem Relation Age of Onset  . Hypertension Mother   . Stroke Mother 35    5 days post partum  . Hypertension Brother      2 brothers  . Cancer Brother 69    colorectal  . Hypertension Maternal Grandmother   . Thyroid disease Maternal Grandmother     hypothyroidism  . Diabetes Maternal Grandmother     diet controlled  . Dementia Maternal Grandmother 20  . Alzheimer's disease Maternal Grandmother   . Heart attack Neg Hx   . Dementia Paternal Aunt     Review of Systems  Constitutional:  Negative for fever and chills.  Respiratory: Negative for cough, shortness of breath and wheezing.   Cardiovascular: Negative for chest pain, palpitations and leg swelling.  Gastrointestinal: Positive for constipation. Negative for abdominal pain, diarrhea and blood in stool.       GERD on occasion depending on what she eats  Musculoskeletal: Negative for myalgias.  Neurological: Negative for dizziness, light-headedness and headaches.       Objective:   Filed Vitals:   07/14/15 1407  BP: 128/88  Pulse: 80  Temp: 98.2 F (36.8 C)  Resp: 16   Filed Weights   07/14/15 1407  Weight: 237 lb (107.502 kg)   Body mass index is 37.68 kg/(m^2).   Physical Exam Constitutional: Appears well-developed and well-nourished. No distress.  Neck: Neck supple. No tracheal deviation present. No thyromegaly present.  No carotid bruit. No cervical adenopathy.   Cardiovascular: Normal rate, regular rhythm and normal heart sounds.   No murmur heard.  No edema Pulmonary/Chest: Effort normal and breath sounds normal. No respiratory distress. No wheezes.        Assessment & Plan:   See Problem List for Assessment and Plan of chronic medical problems.  We had  Long discussion about lifestyle changes - she needs to exercise, decrease her sugars/carbs and work on weight loss.  Discussed her risk for diabetes, stroke, heart attack  Follow up in 6 months -- blood work one week prior

## 2015-07-14 NOTE — Assessment & Plan Note (Signed)
Lipid panel controlled Continue crestor  Check lipids every 6 months Work on lifestyle changes

## 2015-07-14 NOTE — Progress Notes (Deleted)
Patient received education resource, including the self-management goal and tool. Patient verbalized understanding. 

## 2015-11-08 ENCOUNTER — Telehealth: Payer: Self-pay

## 2015-11-08 MED ORDER — ROSUVASTATIN CALCIUM 20 MG PO TABS: 20.0000 mg | ORAL_TABLET | Freq: Every day | ORAL | Status: DC

## 2015-11-08 NOTE — Telephone Encounter (Signed)
[facility] sent a rf rq for rosuvastatin  PCP approved.   erx sent to pharmacy requested.

## 2016-01-16 ENCOUNTER — Ambulatory Visit: Payer: BLUE CROSS/BLUE SHIELD | Admitting: Internal Medicine

## 2016-01-31 ENCOUNTER — Other Ambulatory Visit (INDEPENDENT_AMBULATORY_CARE_PROVIDER_SITE_OTHER): Payer: BLUE CROSS/BLUE SHIELD

## 2016-01-31 DIAGNOSIS — I10 Essential (primary) hypertension: Secondary | ICD-10-CM | POA: Diagnosis not present

## 2016-01-31 DIAGNOSIS — E785 Hyperlipidemia, unspecified: Secondary | ICD-10-CM

## 2016-01-31 DIAGNOSIS — R7303 Prediabetes: Secondary | ICD-10-CM

## 2016-01-31 LAB — LIPID PANEL
CHOL/HDL RATIO: 4
Cholesterol: 181 mg/dL (ref 0–200)
HDL: 51.5 mg/dL (ref >39.00)
LDL Cholesterol: 110 mg/dL — ABNORMAL HIGH (ref 0–99)
NONHDL: 129.5
Triglycerides: 99 mg/dL (ref 0.0–149.0)
VLDL: 19.8 mg/dL (ref 0.0–40.0)

## 2016-01-31 LAB — COMPREHENSIVE METABOLIC PANEL
ALBUMIN: 4.6 g/dL (ref 3.5–5.2)
ALK PHOS: 91 U/L (ref 39–117)
ALT: 17 U/L (ref 0–35)
AST: 17 U/L (ref 0–37)
BILIRUBIN TOTAL: 0.6 mg/dL (ref 0.2–1.2)
BUN: 20 mg/dL (ref 6–23)
CALCIUM: 10 mg/dL (ref 8.4–10.5)
CHLORIDE: 104 meq/L (ref 96–112)
CO2: 27 meq/L (ref 19–32)
Creatinine, Ser: 1.12 mg/dL (ref 0.40–1.20)
GFR: 64.02 mL/min (ref >60.00)
Glucose, Bld: 93 mg/dL (ref 70–99)
POTASSIUM: 4.1 meq/L (ref 3.5–5.1)
SODIUM: 140 meq/L (ref 135–145)
Total Protein: 8.1 g/dL (ref 6.0–8.3)

## 2016-01-31 LAB — TSH: TSH: 1.83 u[IU]/mL (ref 0.35–4.50)

## 2016-01-31 LAB — HEMOGLOBIN A1C: Hgb A1c MFr Bld: 6.4 % (ref 4.6–6.5)

## 2016-02-01 ENCOUNTER — Encounter: Payer: Self-pay | Admitting: Internal Medicine

## 2016-02-06 ENCOUNTER — Encounter: Payer: Self-pay | Admitting: Internal Medicine

## 2016-02-06 ENCOUNTER — Ambulatory Visit (INDEPENDENT_AMBULATORY_CARE_PROVIDER_SITE_OTHER): Payer: BLUE CROSS/BLUE SHIELD | Admitting: Internal Medicine

## 2016-02-06 VITALS — BP 136/86 | HR 70 | Temp 98.2°F | Resp 16 | Wt 235.0 lb

## 2016-02-06 DIAGNOSIS — Z8639 Personal history of other endocrine, nutritional and metabolic disease: Secondary | ICD-10-CM | POA: Diagnosis not present

## 2016-02-06 DIAGNOSIS — I10 Essential (primary) hypertension: Secondary | ICD-10-CM | POA: Diagnosis not present

## 2016-02-06 DIAGNOSIS — E785 Hyperlipidemia, unspecified: Secondary | ICD-10-CM

## 2016-02-06 DIAGNOSIS — R011 Cardiac murmur, unspecified: Secondary | ICD-10-CM | POA: Insufficient documentation

## 2016-02-06 DIAGNOSIS — R7303 Prediabetes: Secondary | ICD-10-CM | POA: Diagnosis not present

## 2016-02-06 DIAGNOSIS — Z0000 Encounter for general adult medical examination without abnormal findings: Secondary | ICD-10-CM

## 2016-02-06 DIAGNOSIS — K219 Gastro-esophageal reflux disease without esophagitis: Secondary | ICD-10-CM

## 2016-02-06 NOTE — Assessment & Plan Note (Signed)
a1c 6.4% Stressed low sugar/carb diet, regular exercise and weight loss Monitor Q 18months

## 2016-02-06 NOTE — Progress Notes (Signed)
Subjective:    Patient ID: Kathleen Lucero, female    DOB: 1956/03/20, 60 y.o.   MRN: [id number]  HPI The patient is here for follow up.  Prediabetes:  She is fairly compliant with a low sugar/carbohydrate diet.  She is still working on it. She stopped soda, but is drinking juice.  She is not exercising regularly.  She has not been successful with losing weight.   Hypertension: She is taking her medication daily. She is compliant with a low sodium diet.  She denies chest pain, palpitations, edema, shortness of breath and regular headaches. She is not exercising regularly.  She does not monitor her blood pressure at home.    Hyperlipidemia: She is taking her medication daily. She is compliant with a low fat/cholesterol diet. She is exercising regularly. She denies myalgias.   GERD:  She is taking her medication daily as prescribed.  She denies any GERD symptoms and feels her GERD is well controlled.    Medications and allergies reviewed with patient and updated if appropriate.  Patient Active Problem List   Diagnosis Date Noted  . Prediabetes 07/14/2015  . Fibroids, intramural 03/28/2015  . Obesity (BMI 30-39.9) 11/20/2013  . History of colonic polyps 11/19/2013  . Nonspecific abnormal electrocardiogram (ECG) (EKG) 10/04/2011  . HEADACHE 04/24/2010  . History of hyperthyroidism 11/02/2008  . GOITER, MULTINODULAR 09/13/2008  . GERD 08/30/2008  . [place] DISEASE, LUMBOSACRAL SPINE 08/30/2008  . Hyperlipidemia 11/05/2006  . Essential hypertension 11/05/2006    Current Outpatient Prescriptions on File Prior to Visit  Medication Sig Dispense Refill  . diltiazem (CARDIZEM CD) 180 MG 24 hr capsule Take 1 capsule (180 mg total) by mouth daily. 90 capsule 3  . omeprazole (PRILOSEC) 20 MG capsule Take 1 capsule (20 mg total) by mouth 2 (two) times daily before a meal. 180 capsule 3  . rosuvastatin (CRESTOR) 20 MG tablet Take 1 tablet (20 mg total) by mouth daily. 90 tablet 3  .  spironolactone (ALDACTONE) 25 MG tablet Take 1 tablet (25 mg total) by mouth 2 (two) times daily. 180 tablet 3   No current facility-administered medications on file prior to visit.     Past Medical History:  Diagnosis Date  . Degenerative disc disease    S/P ruptured disc, Dr. Lindwood Qua  . GERD (gastroesophageal reflux disease) 2007  . Goiter   . Heart murmur   . Hyperlipidemia   . Hypertension   . T3 thyrotoxicosis 2011   S/P RAI  . Uterine fibroid    Dr Sabra Heck    Past Surgical History:  Procedure Laterality Date  . colonoscopy with polypectomy  2011   Dr Floria Raveling done 2013 as recommended  . TUBAL LIGATION  1981  . WISDOM TOOTH EXTRACTION      Social History   Social History  . Marital status: Married    Spouse name: N/A  . Number of children: N/A  . Years of education: N/A   Social History Main Topics  . Smoking status: Never Smoker  . Smokeless tobacco: Never Used  . Alcohol use No  . Drug use: No  . Sexual activity: No     Comment: BTL   Other Topics Concern  . Not on file   Social History Narrative   Walks 2X wkly    Family History  Problem Relation Age of Onset  . Hypertension Mother   . Stroke Mother 35    5 days post partum  . Hypertension Brother  2 brothers  . Cancer Brother 91    colorectal  . Hypertension Maternal Grandmother   . Thyroid disease Maternal Grandmother     hypothyroidism  . Diabetes Maternal Grandmother     diet controlled  . Dementia Maternal Grandmother 38  . Alzheimer's disease Maternal Grandmother   . Heart attack Neg Hx   . Dementia Paternal Aunt     Review of Systems  Constitutional: Negative for fever.  Respiratory: Negative for cough, shortness of breath and wheezing.   Cardiovascular: Negative for chest pain, palpitations and leg swelling.  Neurological: Negative for dizziness, light-headedness and headaches.       Objective:   Vitals:   02/06/16 0834  BP: 136/86  Pulse: 70  Resp: 16  Temp:  98.2 F (36.8 C)   Filed Weights   02/06/16 0834  Weight: 235 lb (106.6 kg)   Body mass index is 37.36 kg/m.   Physical Exam    Constitutional: Appears well-developed and well-nourished. No distress.  HENT:  Head: Normocephalic and atraumatic.  Neck: Neck supple. No tracheal deviation present. No thyromegaly present.  Cardiovascular: Normal rate, regular rhythm and normal heart sounds.   2/6 systolic murmur RSB  No carotid bruit.  No edema  Pulmonary/Chest: Effort normal and breath sounds normal. No respiratory distress. No has no wheezes. No rales.  Lymphadenopathy: No cervical adenopathy.  Skin: Skin is warm and dry. Not diaphoretic.  Psychiatric: Normal mood and affect. Behavior is normal.     Assessment & Plan:   Deferred flu vaccine, will get td at gyn  See Problem List for Assessment and Plan of chronic medical problems.   F/u in 6 months - CPE

## 2016-02-06 NOTE — Assessment & Plan Note (Signed)
GERD controlled Continue daily medication  

## 2016-02-06 NOTE — Patient Instructions (Addendum)
  All other Health Maintenance issues reviewed.   All recommended immunizations and age-appropriate screenings are up-to-date or discussed.  No immunizations administered today.   Medications reviewed and updated.  No changes recommended at this time.    Please followup in 6 months for a physical

## 2016-02-06 NOTE — Progress Notes (Signed)
Pre visit review using our clinic review tool, if applicable. No additional management support is needed unless otherwise documented below in the visit note. 

## 2016-02-06 NOTE — Assessment & Plan Note (Signed)
BP well controlled Current regimen effective and well tolerated Continue current medications at current doses  

## 2016-02-06 NOTE — Assessment & Plan Note (Signed)
Lipid panel controlled Continue statin 

## 2016-02-06 NOTE — Assessment & Plan Note (Addendum)
tsh in normal range No medication needed Continue to monitor

## 2016-02-06 NOTE — Assessment & Plan Note (Signed)
Echo ordered She states she has had this since she was a child

## 2016-03-20 ENCOUNTER — Other Ambulatory Visit: Payer: Self-pay | Admitting: Internal Medicine

## 2016-04-25 ENCOUNTER — Encounter: Payer: Self-pay | Admitting: Obstetrics & Gynecology

## 2016-06-18 ENCOUNTER — Other Ambulatory Visit: Payer: Self-pay | Admitting: Internal Medicine

## 2016-06-18 DIAGNOSIS — R011 Cardiac murmur, unspecified: Secondary | ICD-10-CM

## 2016-07-16 ENCOUNTER — Other Ambulatory Visit: Payer: Self-pay

## 2016-07-16 ENCOUNTER — Encounter: Payer: Self-pay | Admitting: Internal Medicine

## 2016-07-16 ENCOUNTER — Ambulatory Visit (HOSPITAL_COMMUNITY): Payer: BLUE CROSS/BLUE SHIELD | Attending: Cardiology

## 2016-07-16 ENCOUNTER — Ambulatory Visit (INDEPENDENT_AMBULATORY_CARE_PROVIDER_SITE_OTHER): Payer: BLUE CROSS/BLUE SHIELD | Admitting: Internal Medicine

## 2016-07-16 ENCOUNTER — Other Ambulatory Visit (INDEPENDENT_AMBULATORY_CARE_PROVIDER_SITE_OTHER): Payer: BLUE CROSS/BLUE SHIELD

## 2016-07-16 VITALS — BP 134/84 | HR 74 | Temp 98.0°F | Resp 16 | Ht 67.0 in | Wt 239.0 lb

## 2016-07-16 DIAGNOSIS — E669 Obesity, unspecified: Secondary | ICD-10-CM | POA: Diagnosis not present

## 2016-07-16 DIAGNOSIS — K219 Gastro-esophageal reflux disease without esophagitis: Secondary | ICD-10-CM | POA: Diagnosis not present

## 2016-07-16 DIAGNOSIS — E7800 Pure hypercholesterolemia, unspecified: Secondary | ICD-10-CM | POA: Diagnosis not present

## 2016-07-16 DIAGNOSIS — I10 Essential (primary) hypertension: Secondary | ICD-10-CM

## 2016-07-16 DIAGNOSIS — R011 Cardiac murmur, unspecified: Secondary | ICD-10-CM

## 2016-07-16 DIAGNOSIS — R7303 Prediabetes: Secondary | ICD-10-CM

## 2016-07-16 DIAGNOSIS — Z1211 Encounter for screening for malignant neoplasm of colon: Secondary | ICD-10-CM

## 2016-07-16 DIAGNOSIS — Z0000 Encounter for general adult medical examination without abnormal findings: Secondary | ICD-10-CM

## 2016-07-16 LAB — CBC WITH DIFFERENTIAL/PLATELET
BASOS ABS: 0.1 10*3/uL (ref 0.0–0.1)
Basophils Relative: 1 % (ref 0.0–3.0)
EOS ABS: 0.3 10*3/uL (ref 0.0–0.7)
Eosinophils Relative: 4.2 % (ref 0.0–5.0)
HEMATOCRIT: 45.7 % (ref 36.0–46.0)
HEMOGLOBIN: 15.1 g/dL — AB (ref 12.0–15.0)
LYMPHS PCT: 31 % (ref 12.0–46.0)
Lymphs Abs: 2 10*3/uL (ref 0.7–4.0)
MCHC: 33 g/dL (ref 30.0–36.0)
MCV: 84.3 fl (ref 78.0–100.0)
MONOS PCT: 6.9 % (ref 3.0–12.0)
Monocytes Absolute: 0.4 10*3/uL (ref 0.1–1.0)
NEUTROS ABS: 3.6 10*3/uL (ref 1.4–7.7)
Neutrophils Relative %: 56.9 % (ref 43.0–77.0)
PLATELETS: 239 10*3/uL (ref 150.0–400.0)
RBC: 5.43 Mil/uL — ABNORMAL HIGH (ref 3.87–5.11)
RDW: 13.6 % (ref 11.5–15.5)
WBC: 6.3 10*3/uL (ref 4.0–10.5)

## 2016-07-16 LAB — COMPREHENSIVE METABOLIC PANEL
ALBUMIN: 4.8 g/dL (ref 3.5–5.2)
ALK PHOS: 100 U/L (ref 39–117)
ALT: 25 U/L (ref 0–35)
AST: 21 U/L (ref 0–37)
BILIRUBIN TOTAL: 0.6 mg/dL (ref 0.2–1.2)
BUN: 14 mg/dL (ref 6–23)
CALCIUM: 10.2 mg/dL (ref 8.4–10.5)
CO2: 27 meq/L (ref 19–32)
CREATININE: 1.07 mg/dL (ref 0.40–1.20)
Chloride: 105 mEq/L (ref 96–112)
GFR: 67.38 mL/min (ref >60.00)
Glucose, Bld: 122 mg/dL — ABNORMAL HIGH (ref 70–99)
Potassium: 4.2 mEq/L (ref 3.5–5.1)
Sodium: 141 mEq/L (ref 135–145)
Total Protein: 8.1 g/dL (ref 6.0–8.3)

## 2016-07-16 LAB — TSH: TSH: 2.02 u[IU]/mL (ref 0.35–4.50)

## 2016-07-16 LAB — LIPID PANEL
CHOLESTEROL: 197 mg/dL (ref 0–200)
HDL: 48.9 mg/dL (ref >39.00)
LDL CALC: 124 mg/dL — AB (ref 0–99)
NonHDL: 148.36
TRIGLYCERIDES: 124 mg/dL (ref 0.0–149.0)
Total CHOL/HDL Ratio: 4
VLDL: 24.8 mg/dL (ref 0.0–40.0)

## 2016-07-16 LAB — ECHOCARDIOGRAM COMPLETE
HEIGHTINCHES: 67 in
WEIGHTICAEL: 3824 [oz_av]

## 2016-07-16 LAB — HEMOGLOBIN A1C: Hgb A1c MFr Bld: 6.7 % — ABNORMAL HIGH (ref 4.6–6.5)

## 2016-07-16 MED ORDER — CRESTOR 20 MG PO TABS: 20.0000 mg | ORAL_TABLET | Freq: Every day | ORAL | 3 refills | Status: DC

## 2016-07-16 NOTE — Assessment & Plan Note (Signed)
Referred to nutrition Eats out most meals, not exercising Start exercising both days on weekend and one day during week initially Start improving one meal at a time - will work on breakfast first and slowly make changes

## 2016-07-16 NOTE — Assessment & Plan Note (Signed)
Check lipid panel  Continue daily statin Regular exercise and healthy diet encouraged  

## 2016-07-16 NOTE — Assessment & Plan Note (Signed)
GERD controlled Continue daily medication  

## 2016-07-16 NOTE — Assessment & Plan Note (Signed)
Check a1c Low sugar / carb diet Stressed regular exercise, keeping weight down/weight loss 

## 2016-07-16 NOTE — Progress Notes (Signed)
Pre visit review using our clinic review tool, if applicable. No additional management support is needed unless otherwise documented below in the visit note. 

## 2016-07-16 NOTE — Progress Notes (Signed)
Subjective:    Patient ID: Kathleen Lucero, female    DOB: Feb 25, 1956, 61 y.o.   MRN: [id number]  HPI She is here for a physical exam.   She works from [employer], leaves 6-6:30.  Then works at home to 8:30.   Does some work on the weekends.   Allergies:  She has had sneezing and watery eyes.  She thinks it is allergies.   She has gained weight. She does not eat healthy - mostly take out.  She knows she needs to change her lifestyle.  She knows she needs to lose weight.  She needs help making the changes.   Medications and allergies reviewed with patient and updated if appropriate.  Patient Active Problem List   Diagnosis Date Noted  . Undiagnosed cardiac murmurs 02/06/2016  . Prediabetes 07/14/2015  . Fibroids, intramural 03/28/2015  . Obesity (BMI 30-39.9) 11/20/2013  . History of colonic polyps 11/19/2013  . Nonspecific abnormal electrocardiogram (ECG) (EKG) 10/04/2011  . HEADACHE 04/24/2010  . History of hyperthyroidism 11/02/2008  . GOITER, MULTINODULAR 09/13/2008  . GERD 08/30/2008  . [place] DISEASE, LUMBOSACRAL SPINE 08/30/2008  . Hyperlipidemia 11/05/2006  . Essential hypertension 11/05/2006    Current Outpatient Prescriptions on File Prior to Visit  Medication Sig Dispense Refill  . diltiazem (CARDIZEM CD) 180 MG 24 hr capsule TAKE 1 CAPSULE (180 MG TOTAL) BY MOUTH DAILY. 90 capsule 2  . omeprazole (PRILOSEC) 20 MG capsule Take 1 capsule (20 mg total) by mouth 2 (two) times daily before a meal. 180 capsule 3  . rosuvastatin (CRESTOR) 20 MG tablet Take 1 tablet (20 mg total) by mouth daily. 90 tablet 3  . spironolactone (ALDACTONE) 25 MG tablet Take 1 tablet (25 mg total) by mouth 2 (two) times daily. 180 tablet 3   No current facility-administered medications on file prior to visit.     Past Medical History:  Diagnosis Date  . Degenerative disc disease    S/P ruptured disc, Dr. Lindwood Qua  . GERD (gastroesophageal reflux disease) 2007  . Goiter   . Heart murmur    . Hyperlipidemia   . Hypertension   . T3 thyrotoxicosis 2011   S/P RAI  . Uterine fibroid    Dr Sabra Heck    Past Surgical History:  Procedure Laterality Date  . colonoscopy with polypectomy  2011   Dr Floria Raveling done 2013 as recommended  . TUBAL LIGATION  1981  . WISDOM TOOTH EXTRACTION      Social History   Social History  . Marital status: Married    Spouse name: N/A  . Number of children: N/A  . Years of education: N/A   Social History Main Topics  . Smoking status: Never Smoker  . Smokeless tobacco: Never Used  . Alcohol use No  . Drug use: No  . Sexual activity: No     Comment: BTL   Other Topics Concern  . None   Social History Narrative   Walks 2X wkly    Family History  Problem Relation Age of Onset  . Hypertension Mother   . Stroke Mother 35    5 days post partum  . Hypertension Brother      2 brothers  . Cancer Brother 4    colorectal  . Hypertension Maternal Grandmother   . Thyroid disease Maternal Grandmother     hypothyroidism  . Diabetes Maternal Grandmother     diet controlled  . Dementia Maternal Grandmother 43  . Alzheimer's disease Maternal Grandmother   .  Heart attack Neg Hx   . Dementia Paternal Aunt     Review of Systems  Constitutional: Negative for chills and fever.  HENT: Positive for congestion, postnasal drip, rhinorrhea and sneezing. Negative for ear pain, sinus pain, sinus pressure and sore throat.   Eyes: Negative for visual disturbance.  Respiratory: Negative for cough, shortness of breath and wheezing.   Cardiovascular: Negative for chest pain, palpitations and leg swelling.  Gastrointestinal: Positive for constipation. Negative for abdominal pain, blood in stool, diarrhea and nausea.  Genitourinary: Negative for dysuria and hematuria.  Musculoskeletal: Positive for back pain. Negative for arthralgias.  Skin: Negative for color change and rash.  Neurological: Positive for headaches (occ). Negative for  light-headedness.  Psychiatric/Behavioral: Negative for dysphoric mood. The patient is nervous/anxious.        Objective:   Vitals:   07/16/16 0809  BP: 134/84  Pulse: 74  Resp: 16  Temp: 98 F (36.7 C)   Filed Weights   07/16/16 0809  Weight: 239 lb (108.4 kg)   Body mass index is 37.43 kg/m.  Wt Readings from Last 3 Encounters:  07/16/16 239 lb (108.4 kg)  02/06/16 235 lb (106.6 kg)  07/14/15 237 lb (107.5 kg)     Physical Exam Constitutional: She appears well-developed and well-nourished. No distress.  HENT:  Head: Normocephalic and atraumatic.  Right Ear: External ear normal. Normal ear canal and TM Left Ear: External ear normal.  Normal ear canal and TM Mouth/Throat: Oropharynx is clear and moist.  Eyes: Conjunctivae and EOM are normal.  Neck: Neck supple. No tracheal deviation present. No thyromegaly present.  No carotid bruit  Cardiovascular: Normal rate, regular rhythm and normal heart sounds.   2/6 systolic murmur heard.  No edema. Pulmonary/Chest: Effort normal and breath sounds normal. No respiratory distress. She has no wheezes. She has no rales.  Breast: deferred to Gyn Abdominal: Soft. She exhibits no distension. There is no tenderness.  Lymphadenopathy: She has no cervical adenopathy.  Skin: Skin is warm and dry. She is not diaphoretic. normal appearing mole on face Psychiatric: She has a normal mood and affect. Her behavior is normal.         Assessment & Plan:   Physical exam: Screening blood work  ordered Immunizations  tdap deferred by patient Colonoscopy  - due - will refer to dr Collene Mares Mammogram  Up to date   Gyn  Up to date - has appt friday Eye exams  Due - will schedule  Exercise  - none - stressed exercise three times a week - start on weekends and once during day Weight - obese - discussed wieght loss at length Skin - darker spot on face - appears normal Substance abuse  none  See Problem List for Assessment and Plan of chronic  medical problems.  FU in 6 months

## 2016-07-16 NOTE — Assessment & Plan Note (Signed)
BP controlled Current regimen effective and well tolerated Continue current medications at current doses 

## 2016-07-16 NOTE — Assessment & Plan Note (Signed)
Has Echo scheduled

## 2016-07-16 NOTE — Patient Instructions (Addendum)
Test(s) ordered today. Your results will be released to [facility] (or called to you) after review, usually within 72hours after test completion. If any changes need to be made, you will be notified at that same time.  All other Health Maintenance issues reviewed.   All recommended immunizations and age-appropriate screenings are up-to-date or discussed.  No immunizations administered today.   Medications reviewed and updated.   No changes recommended at this time.   A referral was ordered for nutrition  Please followup in 6 months   Health Maintenance, Female Adopting a healthy lifestyle and getting preventive care can go a long way to promote health and wellness. Talk with your health care provider about what schedule of regular examinations is right for you. This is a good chance for you to check in with your provider about disease prevention and staying healthy. In between checkups, there are plenty of things you can do on your own. Experts have done a lot of research about which lifestyle changes and preventive measures are most likely to keep you healthy. Ask your health care provider for more information. Weight and diet Eat a healthy diet  Be sure to include plenty of vegetables, fruits, low-fat dairy products, and lean protein.  Do not eat a lot of foods high in solid fats, added sugars, or salt.  Get regular exercise. This is one of the most important things you can do for your health.  Most adults should exercise for at least 150 minutes each week. The exercise should increase your heart rate and make you sweat (moderate-intensity exercise).  Most adults should also do strengthening exercises at least twice a week. This is in addition to the moderate-intensity exercise. Maintain a healthy weight  Body mass index (BMI) is a measurement that can be used to identify possible weight problems. It estimates body fat based on height and weight. Your health care provider can help  determine your BMI and help you achieve or maintain a healthy weight.  For females 78 years of age and older:  A BMI below 18.5 is considered underweight.  A BMI of 18.5 to 24.9 is normal.  A BMI of 25 to 29.9 is considered overweight.  A BMI of 30 and above is considered obese. Watch levels of cholesterol and blood lipids  You should start having your blood tested for lipids and cholesterol at 60 years of age, then have this test every 5 years.  You may need to have your cholesterol levels checked more often if:  Your lipid or cholesterol levels are high.  You are older than 60 years of age.  You are at high risk for heart disease. Cancer screening Lung Cancer  Lung cancer screening is recommended for adults 37-66 years old who are at high risk for lung cancer because of a history of smoking.  A yearly low-dose CT scan of the lungs is recommended for people who:  Currently smoke.  Have quit within the past 15 years.  Have at least a 30-pack-year history of smoking. A pack year is smoking an average of one pack of cigarettes a day for 1 year.  Yearly screening should continue until it has been 15 years since you quit.  Yearly screening should stop if you develop a health problem that would prevent you from having lung cancer treatment. Breast Cancer  Practice breast self-awareness. This means understanding how your breasts normally appear and feel.  It also means doing regular breast self-exams. Let your health care provider  know about any changes, no matter how small.  If you are in your 20s or 30s, you should have a clinical breast exam (CBE) by a health care provider every 1-3 years as part of a regular health exam.  If you are 36 or older, have a CBE every year. Also consider having a breast X-ray (mammogram) every year.  If you have a family history of breast cancer, talk to your health care provider about genetic screening.  If you are at high risk for breast  cancer, talk to your health care provider about having an MRI and a mammogram every year.  Breast cancer gene (BRCA) assessment is recommended for women who have family members with BRCA-related cancers. BRCA-related cancers include:  Breast.  Ovarian.  Tubal.  Peritoneal cancers.  Results of the assessment will determine the need for genetic counseling and BRCA1 and BRCA2 testing. Cervical Cancer  Your health care provider may recommend that you be screened regularly for cancer of the pelvic organs (ovaries, uterus, and vagina). This screening involves a pelvic examination, including checking for microscopic changes to the surface of your cervix (Pap test). You may be encouraged to have this screening done every 3 years, beginning at age 102.  For women ages 84-65, health care providers may recommend pelvic exams and Pap testing every 3 years, or they may recommend the Pap and pelvic exam, combined with testing for human papilloma virus (HPV), every 5 years. Some types of HPV increase your risk of cervical cancer. Testing for HPV may also be done on women of any age with unclear Pap test results.  Other health care providers may not recommend any screening for nonpregnant women who are considered low risk for pelvic cancer and who do not have symptoms. Ask your health care provider if a screening pelvic exam is right for you.  If you have had past treatment for cervical cancer or a condition that could lead to cancer, you need Pap tests and screening for cancer for at least 20 years after your treatment. If Pap tests have been discontinued, your risk factors (such as having a new sexual partner) need to be reassessed to determine if screening should resume. Some women have medical problems that increase the chance of getting cervical cancer. In these cases, your health care provider may recommend more frequent screening and Pap tests. Colorectal Cancer  This type of cancer can be detected and  often prevented.  Routine colorectal cancer screening usually begins at 60 years of age and continues through 60 years of age.  Your health care provider may recommend screening at an earlier age if you have risk factors for colon cancer.  Your health care provider may also recommend using home test kits to check for hidden blood in the stool.  A small camera at the end of a tube can be used to examine your colon directly (sigmoidoscopy or colonoscopy). This is done to check for the earliest forms of colorectal cancer.  Routine screening usually begins at age 10.  Direct examination of the colon should be repeated every 5-10 years through 60 years of age. However, you may need to be screened more often if early forms of precancerous polyps or small growths are found. Skin Cancer  Check your skin from head to toe regularly.  Tell your health care provider about any new moles or changes in moles, especially if there is a change in a mole's shape or color.  Also tell your health care provider  if you have a mole that is larger than the size of a pencil eraser.  Always use sunscreen. Apply sunscreen liberally and repeatedly throughout the day.  Protect yourself by wearing long sleeves, pants, a wide-brimmed hat, and sunglasses whenever you are outside. Heart disease, diabetes, and high blood pressure  High blood pressure causes heart disease and increases the risk of stroke. High blood pressure is more likely to develop in:  People who have blood pressure in the high end of the normal range (130-139/85-89 mm Hg).  People who are overweight or obese.  People who are African American.  If you are 39-49 years of age, have your blood pressure checked every 3-5 years. If you are 55 years of age or older, have your blood pressure checked every year. You should have your blood pressure measured twice-once when you are at a hospital or clinic, and once when you are not at a hospital or clinic.  Record the average of the two measurements. To check your blood pressure when you are not at a hospital or clinic, you can use:  An automated blood pressure machine at a pharmacy.  A home blood pressure monitor.  If you are between 18 years and 69 years old, ask your health care provider if you should take aspirin to prevent strokes.  Have regular diabetes screenings. This involves taking a blood sample to check your fasting blood sugar level.  If you are at a normal weight and have a low risk for diabetes, have this test once every three years after 60 years of age.  If you are overweight and have a high risk for diabetes, consider being tested at a younger age or more often. Preventing infection Hepatitis B  If you have a higher risk for hepatitis B, you should be screened for this virus. You are considered at high risk for hepatitis B if:  You were born in a country where hepatitis B is common. Ask your health care provider which countries are considered high risk.  Your parents were born in a high-risk country, and you have not been immunized against hepatitis B (hepatitis B vaccine).  You have HIV or AIDS.  You use needles to inject street drugs.  You live with someone who has hepatitis B.  You have had sex with someone who has hepatitis B.  You get hemodialysis treatment.  You take certain medicines for conditions, including cancer, organ transplantation, and autoimmune conditions. Hepatitis C  Blood testing is recommended for:  Everyone born from [birth year] through 1964.  Anyone with known risk factors for hepatitis C. Sexually transmitted infections (STIs)  You should be screened for sexually transmitted infections (STIs) including gonorrhea and chlamydia if:  You are sexually active and are younger than 60 years of age.  You are older than 60 years of age and your health care provider tells you that you are at risk for this type of infection.  Your sexual activity  has changed since you were last screened and you are at an increased risk for chlamydia or gonorrhea. Ask your health care provider if you are at risk.  If you do not have HIV, but are at risk, it may be recommended that you take a prescription medicine daily to prevent HIV infection. This is called pre-exposure prophylaxis (PrEP). You are considered at risk if:  You are sexually active and do not regularly use condoms or know the HIV status of your partner(s).  You take drugs by injection.  You are sexually active with a partner who has HIV. Talk with your health care provider about whether you are at high risk of being infected with HIV. If you choose to begin PrEP, you should first be tested for HIV. You should then be tested every 3 months for as long as you are taking PrEP. Pregnancy  If you are premenopausal and you may become pregnant, ask your health care provider about preconception counseling.  If you may become pregnant, take 400 to 800 micrograms (mcg) of folic acid every day.  If you want to prevent pregnancy, talk to your health care provider about birth control (contraception). Osteoporosis and menopause  Osteoporosis is a disease in which the bones lose minerals and strength with aging. This can result in serious bone fractures. Your risk for osteoporosis can be identified using a bone density scan.  If you are 82 years of age or older, or if you are at risk for osteoporosis and fractures, ask your health care provider if you should be screened.  Ask your health care provider whether you should take a calcium or vitamin D supplement to lower your risk for osteoporosis.  Menopause may have certain physical symptoms and risks.  Hormone replacement therapy may reduce some of these symptoms and risks. Talk to your health care provider about whether hormone replacement therapy is right for you. Follow these instructions at home:  Schedule regular health, dental, and eye  exams.  Stay current with your immunizations.  Do not use any tobacco products including cigarettes, chewing tobacco, or electronic cigarettes.  If you are pregnant, do not drink alcohol.  If you are breastfeeding, limit how much and how often you drink alcohol.  Limit alcohol intake to no more than 1 drink per day for nonpregnant women. One drink equals 12 ounces of beer, 5 ounces of wine, or 1 ounces of hard liquor.  Do not use street drugs.  Do not share needles.  Ask your health care provider for help if you need support or information about quitting drugs.  Tell your health care provider if you often feel depressed.  Tell your health care provider if you have ever been abused or do not feel safe at home. This information is not intended to replace advice given to you by your health care provider. Make sure you discuss any questions you have with your health care provider. Document Released: 11/12/2010 Document Revised: 10/05/2015 Document Reviewed: 01/31/2015 Elsevier Interactive Patient Education  2017 [employer].

## 2016-07-19 ENCOUNTER — Encounter: Payer: Self-pay | Admitting: Obstetrics & Gynecology

## 2016-07-19 ENCOUNTER — Other Ambulatory Visit (HOSPITAL_COMMUNITY)
Admission: RE | Admit: 2016-07-19 | Discharge: 2016-07-19 | Disposition: A | Discharge status: Home / Self Care | Payer: BLUE CROSS/BLUE SHIELD | Source: Ambulatory Visit | Attending: Obstetrics & Gynecology | Admitting: Obstetrics & Gynecology

## 2016-07-19 ENCOUNTER — Ambulatory Visit (INDEPENDENT_AMBULATORY_CARE_PROVIDER_SITE_OTHER): Payer: BLUE CROSS/BLUE SHIELD | Admitting: Obstetrics & Gynecology

## 2016-07-19 VITALS — BP 132/90 | HR 76 | Resp 18 | Ht 66.5 in | Wt 238.0 lb

## 2016-07-19 DIAGNOSIS — Z124 Encounter for screening for malignant neoplasm of cervix: Secondary | ICD-10-CM

## 2016-07-19 DIAGNOSIS — Z01419 Encounter for gynecological examination (general) (routine) without abnormal findings: Secondary | ICD-10-CM | POA: Diagnosis not present

## 2016-07-19 DIAGNOSIS — Z1151 Encounter for screening for human papillomavirus (HPV): Secondary | ICD-10-CM | POA: Insufficient documentation

## 2016-07-19 NOTE — Progress Notes (Signed)
61 y.o. [id number] MarriedAfrican AmericanF here for annual exam.  Reports work is still stressful.  She's hoping to retire at 66.  Reports stress is at a level "20".    Has gained some weight this year.  HbA1C was 6.7.  Shared results with her today.    Patient's last menstrual period was 05/14/2007.          Sexually active: No.  The current method of family planning is post menopausal status.    Exercising: No.  The patient does not participate in regular exercise at present. Smoker:  no  Health Maintenance: Pap:  02/14/14 Neg. 01/2012 Neg. HR HPV:neg  History of abnormal Pap:  no MMG:  04/16/16 BIRADS1:Neg  Colonoscopy:  01/2010  BMD:   2008  TDaP:  With PCP Pneumonia vaccine(s):  No Zostavax:   No Hep C testing: No Screening Labs: PCP, Urine today: not collected   reports that she has never smoked. She has never used smokeless tobacco. She reports that she does not drink alcohol or use drugs.  Past Medical History:  Diagnosis Date  . Degenerative disc disease    S/P ruptured disc, Dr. Lindwood Qua  . GERD (gastroesophageal reflux disease) 2007  . Goiter   . Heart murmur   . Hyperlipidemia   . Hypertension   . T3 thyrotoxicosis 2011   S/P RAI  . Uterine fibroid    Dr Sabra Heck    Past Surgical History:  Procedure Laterality Date  . colonoscopy with polypectomy  2011   Dr Floria Raveling done 2013 as recommended  . TUBAL LIGATION  1981  . WISDOM TOOTH EXTRACTION      Current Outpatient Prescriptions  Medication Sig Dispense Refill  . CRESTOR 20 MG tablet Take 1 tablet (20 mg total) by mouth daily. 90 tablet 3  . diltiazem (CARDIZEM CD) 180 MG 24 hr capsule TAKE 1 CAPSULE (180 MG TOTAL) BY MOUTH DAILY. 90 capsule 2  . spironolactone (ALDACTONE) 25 MG tablet Take 1 tablet (25 mg total) by mouth 2 (two) times daily. 180 tablet 3  . omeprazole (PRILOSEC) 20 MG capsule Take 1 capsule (20 mg total) by mouth 2 (two) times daily before a meal. (Patient not taking: Reported on 07/19/2016)  180 capsule 3   No current facility-administered medications for this visit.     Family History  Problem Relation Age of Onset  . Hypertension Mother   . Stroke Mother 35    5 days post partum  . Hypertension Brother      2 brothers  . Cancer Brother 53    colorectal  . Hypertension Maternal Grandmother   . Thyroid disease Maternal Grandmother     hypothyroidism  . Diabetes Maternal Grandmother     diet controlled  . Dementia Maternal Grandmother 76  . Alzheimer's disease Maternal Grandmother   . Dementia Paternal Aunt   . Heart attack Neg Hx     ROS:  Pertinent items are noted in HPI.  Otherwise, a comprehensive ROS was negative.  Exam:   BP 132/90 (BP Location: Left Arm, Patient Position: Sitting, Cuff Size: Large)   Pulse 76   Resp 18   Ht 5' 6.5" (1.689 m)   Wt 238 lb (108 kg)   LMP 05/14/2007   BMI 37.84 kg/m   Weight change: +6# Height: 5' 6.5" (168.9 cm)  Ht Readings from Last 3 Encounters:  07/19/16 5' 6.5" (1.689 m)  07/16/16 5\' 7"  (1.702 m)  05/25/15 5' 6.5" (1.689 m)  General appearance: alert, cooperative and appears stated age Head: Normocephalic, without obvious abnormality, atraumatic Neck: no adenopathy, supple, symmetrical, trachea midline and thyroid normal to inspection and palpation Lungs: clear to auscultation bilaterally Breasts: normal appearance, no masses or tenderness Heart: regular rate and rhythm Abdomen: soft, non-tender; bowel sounds normal; no masses,  no organomegaly Extremities: extremities normal, atraumatic, no cyanosis or edema Skin: Skin color, texture, turgor normal. No rashes or lesions Lymph nodes: Cervical, supraclavicular, and axillary nodes normal. No abnormal inguinal nodes palpated Neurologic: Grossly normal   Pelvic: External genitalia:  Two small areas of folliculitis on mons pubis              Urethra:  normal appearing urethra with no masses, tenderness or lesions              Bartholins and Skenes: normal                  Vagina: normal appearing vagina with normal color and discharge, no lesions              Cervix: no lesions              Pap taken: Yes.   Bimanual Exam:  Uterus:  Enlarged 8-10 weeks, globular, unchanged in size              Adnexa: normal adnexa and no mass, fullness, tenderness               Rectovaginal: Confirms               Anus:  normal sphincter tone, no lesions  Chaperone was present for exam.  A:  Well Woman with normal exam PMP, no HRT Fibroid uterus.  Last PUS 8/11 Hypertension Elevated lipids Stressors H/O enlarged thyroid with nodules, s/p RAIU H/O colon polyps and family hx of colon cancer in brother  P:   Mammogram guidlines pap smear with HR HPV obtained today Pt aware colonoscopy is due.  Has gotten a phone call from [place] today Lab work and vaccine UTD.  Advised to have hepatitis testing with next blood work.  Pt declines today. return annually or prn

## 2016-07-21 ENCOUNTER — Other Ambulatory Visit: Payer: Self-pay | Admitting: Internal Medicine

## 2016-07-21 ENCOUNTER — Encounter: Payer: Self-pay | Admitting: Internal Medicine

## 2016-07-21 DIAGNOSIS — R931 Abnormal findings on diagnostic imaging of heart and coronary circulation: Secondary | ICD-10-CM

## 2016-07-23 ENCOUNTER — Encounter: Payer: Self-pay | Admitting: Internal Medicine

## 2016-07-23 LAB — CYTOLOGY - PAP
Diagnosis: NEGATIVE
HPV: NOT DETECTED

## 2016-07-24 ENCOUNTER — Other Ambulatory Visit: Payer: Self-pay | Admitting: Emergency Medicine

## 2016-07-24 NOTE — Progress Notes (Unsigned)
LVM for pt to call back and verify Pharmacy she is using.   As of 08/01/16 have not heard back from pt.

## 2016-07-29 ENCOUNTER — Telehealth: Payer: Self-pay | Admitting: Emergency Medicine

## 2016-07-29 NOTE — Telephone Encounter (Signed)
LVM again on pts mobile number to verify pharamcy that she wants spironolactone sent to.

## 2016-08-02 MED ORDER — SPIRONOLACTONE 25 MG PO TABS
25.0000 mg | ORAL_TABLET | Freq: Two times a day (BID) | ORAL | 3 refills | Status: DC
Start: 2016-08-02 — End: 2017-12-15

## 2016-08-02 NOTE — Telephone Encounter (Signed)
Rx has been [facility] to [facility] 

## 2016-08-02 NOTE — Telephone Encounter (Signed)
Patient states [facility] uses [facility] on [address]

## 2016-08-02 NOTE — Addendum Note (Signed)
Addended by: Earnstine Regal on: 08/02/2016 10:30 AM   Modules accepted: Orders

## 2016-08-06 ENCOUNTER — Encounter: Payer: Self-pay | Admitting: Registered"

## 2016-08-06 ENCOUNTER — Encounter: Payer: BLUE CROSS/BLUE SHIELD | Attending: Internal Medicine | Admitting: Registered"

## 2016-08-06 DIAGNOSIS — Z713 Dietary counseling and surveillance: Secondary | ICD-10-CM | POA: Diagnosis present

## 2016-08-06 DIAGNOSIS — R7303 Prediabetes: Secondary | ICD-10-CM

## 2016-08-06 DIAGNOSIS — E7800 Pure hypercholesterolemia, unspecified: Secondary | ICD-10-CM | POA: Diagnosis not present

## 2016-08-06 DIAGNOSIS — E669 Obesity, unspecified: Secondary | ICD-10-CM | POA: Diagnosis present

## 2016-08-06 DIAGNOSIS — E118 Type 2 diabetes mellitus with unspecified complications: Secondary | ICD-10-CM | POA: Insufficient documentation

## 2016-08-06 NOTE — Patient Instructions (Addendum)
Breakfast ideas: Smoothie recipe: handful of greens, 1/2 c plain yogurt or keifer, 1 cup.blueberries. If want sweeter variety can use flavored yogurt. Cheerios are fine. Take a lunch break - start with Thursdays Lunch: make your own salad dressing to go cafeteria salad bar. Continue great exercise routine!

## 2016-08-06 NOTE — Progress Notes (Signed)
Medical Nutrition Therapy:  Appt start time: 1500 end time:  1610.   Assessment:  Primary concerns today: Pt states she wants to avoid diabetes. Pt states she believes her weight gain is due to busy and lack of balance. Pt reports since diabetes diagnosis she has cut back on soft drinks and started walking. Pt states she want to try smoothies for breakfast and has kale and blueberries at home and needs a good recipe. Pt states she eats late dinner, skips lunch because she gets busy at work.  Preferred Learning Style:   No preference indicated   Learning Readiness:   Ready  MEDICATIONS: reviewed   DIETARY INTAKE:  Usual eating pattern includes 2 meals and 0-1 snacks per day.  Avoided foods include bananas - doesn't like!    24-hr recall:  B ( AM): cheerios OR oatmeal   Snk ( AM): none  L ( PM): salmon   Snk ( PM): none D ( PM): [employer] breast meal, potatoes, coleslaw, OR McDonalds  Snk ( PM): none  Usual physical activity: Going to mall for walking, 4 times last week. Pt. has a goal of walking 7 days. Used to enjoy cycling with husband but is afraid of accidents which would hurt her back  Estimated energy needs: 1600 calories 180 g carbohydrates 120 g protein 50 g fat  Progress Towards Goal(s):  In progress.   Nutritional Diagnosis:  NI-5.8.4 Inconsistent carbohydrate intake As related to imbalance of macronutrient intake .  As evidenced by breakfast all carborhydrates and limited carbohydrates at lunch.    Intervention:  Nutrition education for managing blood glucose with diet and lifestyle changes. Described diabetes. Stated some common risk factors for diabetes.  Defined the role of glucose and insulin.  Identified type of diabetes and pathophysiology.  Described the relationship between diabetes and cardiovascular risk.  Described the role of different macronutrients on glucose.  Explained how carbohydrates affect blood glucose.  Stated what foods contain the most  carbohydrates.  Demonstrated carbohydrate counting.  Demonstrated how to read Nutrition Facts food label.  Teaching Method Utilized:  Visual Auditory Hands on  Handouts given during visit include: Living Well with Diabetes Meal Plan Card Portion Plate Snack Suggestions  Barriers to learning/adherence to lifestyle change: none  Demonstrated degree of understanding via:  Teach Back   Monitoring/Evaluation:  Dietary intake, exercise, and body weight in 6 week(s).

## 2016-08-15 ENCOUNTER — Telehealth: Payer: Self-pay | Admitting: Emergency Medicine

## 2016-08-15 NOTE — Telephone Encounter (Signed)
lipitor 40 mg daily ( equals crestor 20) if she is ok with that

## 2016-08-15 NOTE — Telephone Encounter (Signed)
LVM for pt to call back, will need to know which pharmacy to send RX to.

## 2016-08-15 NOTE — Telephone Encounter (Signed)
Crestor is not covered by [employer], is there an alternative for Crestor?

## 2016-08-28 ENCOUNTER — Ambulatory Visit: Payer: BLUE CROSS/BLUE SHIELD | Admitting: Physician Assistant

## 2016-09-17 ENCOUNTER — Ambulatory Visit: Payer: BLUE CROSS/BLUE SHIELD | Admitting: Registered"

## 2016-09-18 ENCOUNTER — Ambulatory Visit (INDEPENDENT_AMBULATORY_CARE_PROVIDER_SITE_OTHER): Payer: BLUE CROSS/BLUE SHIELD | Admitting: Physician Assistant

## 2016-09-18 ENCOUNTER — Encounter: Payer: Self-pay | Admitting: Physician Assistant

## 2016-09-18 VITALS — BP 122/82 | HR 83 | Ht 67.0 in | Wt 237.1 lb

## 2016-09-18 DIAGNOSIS — E785 Hyperlipidemia, unspecified: Secondary | ICD-10-CM | POA: Diagnosis not present

## 2016-09-18 DIAGNOSIS — E669 Obesity, unspecified: Secondary | ICD-10-CM | POA: Diagnosis not present

## 2016-09-18 DIAGNOSIS — E119 Type 2 diabetes mellitus without complications: Secondary | ICD-10-CM

## 2016-09-18 DIAGNOSIS — Q231 Congenital insufficiency of aortic valve: Secondary | ICD-10-CM | POA: Insufficient documentation

## 2016-09-18 DIAGNOSIS — I10 Essential (primary) hypertension: Secondary | ICD-10-CM

## 2016-09-18 DIAGNOSIS — I340 Nonrheumatic mitral (valve) insufficiency: Secondary | ICD-10-CM | POA: Insufficient documentation

## 2016-09-18 NOTE — Patient Instructions (Signed)
Medication Instructions:    Your physician recommends that you continue on your current medications as directed. Please refer to the Current Medication list given to you today.  Labwork:  None ordered  Testing/Procedures: Your physician has requested that you have an echocardiogram - in one year (before seeing Dr. Tamala Julian). Echocardiography is a painless test that uses sound waves to create images of your heart. It provides your doctor with information about the size and shape of your heart and how well your heart's chambers and valves are working. This procedure takes approximately one hour. There are no restrictions for this procedure.  Follow-Up:  Your physician wants you to follow-up in: 1 year with Dr. Tamala Julian.  You will receive a reminder letter in the mail two months in advance. If you don't receive a letter, please call our office to schedule the follow-up appointment.  - If you need a refill on your cardiac medications before your next appointment, please call your pharmacy.   Thank you for choosing [facility]!!

## 2016-09-18 NOTE — Progress Notes (Signed)
Cardiology Office Note:    The patient has been seen in conjunction with Richardson Dopp, PA-C. All aspects of care have been considered and discussed. The patient has been personally interviewed, examined, and all clinical data has been reviewed.   The patient was educated concerning the [place] history of bicuspid aortic valve.  Plan longitudinal follow-up. No treatment interventions are needed at this time.    Date:  09/18/2016   ID:  Kathleen Lucero, DOB Jun 13, 1955, MRN [id number]  PCP:  Binnie Rail, MD  Cardiologist:  New - Dr. Daneen Schick  /  Richardson Dopp, PA-C   Referring MD: Binnie Rail, MD   Chief Complaint  Patient presents with  . Heart Murmur    History of Present Illness:    Kathleen Lucero is a 61 y.o. female who is being seen today for the evaluation of a murmur at the request of Burns, Claudina Lick, MD.   She has a hx of HTN, HL, thyrotoxicosis s/p RAI.  She denies any hx of chest pain, dyspnea on exertion, syncope, orthopnea, PND, edema.  She has been told her whole life that she has a heart murmur.    PAD Screen 09/18/2016  Previous PAD dx? No  Previous surgical procedure? No  Pain with walking? No  Feet/toe relief with dangling? No  Painful, non-healing ulcers? No  Extremities discolored? No    Prior CV studies:   The following studies were reviewed today:  Echo 07/16/16 Mild concentric LVH, vigorous LVF, EF 65-70, normal wall motion, Gr 1 DD, possibly bicuspid aortic valve (mean 14, peak 31), moderate MR, mild LAE, mild TR, PASP 34  Past Medical History:  Diagnosis Date  . Diabetes mellitus ([place])    A1c 6.7 in 3/18  . GERD (gastroesophageal reflux disease) 2007  . Goiter   . Heart murmur    "my whole life"  . Hyperlipidemia   . Hypertension   . Lumbar degenerative disc disease    S/P ruptured disc, Dr. Lindwood Qua  . T3 thyrotoxicosis 2011   S/P RAI  . Uterine fibroid    Dr Sabra Heck    Past Surgical History:  Procedure Laterality Date    . colonoscopy with polypectomy  2011   Dr Floria Raveling done 2013 as recommended  . TUBAL LIGATION  1981  . WISDOM TOOTH EXTRACTION      Current Medications: Current Meds  Medication Sig  . CRESTOR 20 MG tablet Take 1 tablet (20 mg total) by mouth daily.  Marland Kitchen diltiazem (CARDIZEM CD) 180 MG 24 hr capsule TAKE 1 CAPSULE (180 MG TOTAL) BY MOUTH DAILY.  Marland Kitchen spironolactone (ALDACTONE) 25 MG tablet Take 1 tablet (25 mg total) by mouth 2 (two) times daily.     Allergies:   Other and Codeine sulfate   Social History   Social History  . Marital status: Married    Spouse name: N/A  . Number of children: 3  . Years of education: N/A   Occupational History  . 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 Smoking status: Never Smoker  . Smokeless tobacco: Never Used  . Alcohol use No  . Drug use: No  . Sexual activity: No     Comment: BTL   Other Topics Concern  . None   Social History Narrative   Walks 2X wkly   3 sons, 6 grandchildren   [place] from [place], [place]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     Family Hx: The patient's family history includes  Alzheimer's disease in her maternal grandmother and mother; Cancer (age of onset: 58) in her brother; Dementia in her paternal aunt; Dementia (age of onset: 33) in her maternal grandmother; Diabetes in her maternal grandmother; Hypertension in her brother, maternal grandmother, and mother; Stroke (age of onset: 19) in her mother; Thyroid disease in her maternal grandmother. There is no history of Heart attack.  ROS:   Please see the history of present illness.    Review of Systems  Eyes: Positive for visual disturbance.  Musculoskeletal: Positive for back pain.  Gastrointestinal: Positive for constipation.   All other systems reviewed and are negative.   EKGs/Labs/Other Test Reviewed:    EKG:  EKG is  ordered today.  The ekg ordered today demonstrates NSR, HR 83, leftward axis, borderline LVH, nonspecific ST-T wave changes, QTc 418  ms  Recent Labs: 07/16/2016: ALT 25; BUN 14; Creatinine, Ser 1.07; Hemoglobin 15.1; Platelets 239.0; Potassium 4.2; Sodium 141; TSH 2.02   Recent Lipid Panel    Component Value Date/Time   CHOL 197 07/16/2016 0919   TRIG 124.0 07/16/2016 0919   HDL 48.90 07/16/2016 0919   CHOLHDL 4 07/16/2016 0919   VLDL 24.8 07/16/2016 0919   LDLCALC 124 (H) 07/16/2016 0919   LDLDIRECT 130.2 11/05/2006 0000     Physical Exam:    VS:  BP 122/82   Pulse 83   Ht 5\' 7"  (1.702 m)   Wt 237 lb 1.9 oz (107.6 kg)   LMP 05/14/2007   BMI 37.14 kg/m     Wt Readings from Last 3 Encounters:  09/18/16 237 lb 1.9 oz (107.6 kg)  08/06/16 236 lb 11.2 oz (107.4 kg)  07/19/16 238 lb (108 kg)     Physical Exam  Constitutional: She is oriented to person, place, and time. She appears well-developed and well-nourished. No distress.  HENT:  Head: Normocephalic and atraumatic.  Eyes: No scleral icterus.  Neck: Normal range of motion. No JVD present. Carotid bruit is not present.  Cardiovascular: Normal rate, regular rhythm, S1 normal and S2 normal.   Murmur heard.  Medium-pitched early systolic murmur is present with a grade of 2/6  at the upper right sternal border Pulses:      Dorsalis pedis pulses are 2+ on the right side, and 2+ on the left side.       Posterior tibial pulses are 2+ on the right side, and 2+ on the left side.  Pulmonary/Chest: Breath sounds normal. She has no wheezes. She has no rhonchi. She has no rales.  Abdominal: Soft. There is no tenderness.  Musculoskeletal: She exhibits no edema.  Neurological: She is alert and oriented to person, place, and time.  Skin: Skin is warm and dry.  Psychiatric: She has a normal mood and affect.    ASSESSMENT:    1. Bicuspid aortic valve   2. Mitral valve insufficiency, unspecified etiology   3. Essential hypertension   4. Hyperlipidemia, unspecified hyperlipidemia type   5. Type 2 diabetes mellitus without complication, without long-term  current use of insulin (HCC)   6. Obesity (BMI 30-39.9)    PLAN:    In order of problems listed above:  1. Bicuspid AV -  This is likely the cause of her murmur that she has had her whole life. AV gradient is mild.  She has no symptoms to suggest otherwise.  We discussed the pathophysiology of bicuspid aortic valve, symptoms that would suggest significant aortic valve pathology and reasons to have follow up studies.  Will plan a repeat Echo in 1 year with follow up with Dr. Daneen Schick.    2. Mitral regurgitation - No significant mitral valve murmur on exam.  We discussed the symptoms that would signify worsening mitral regurgitation.  Repeat echo in 1 year.   3. HTN - BP controlled on current regimen.  4. HL -  Managed by PCP.  5. Diabetes Mellitus -  New Dx.  She is working on diet and exercise.  6. Obesity - She is working on weight loss.    Dispo:  Return in about 1 year (around 09/18/2017) for Routine Follow Up with Dr. Tamala Julian.   Medication Adjustments/Labs and Tests Ordered: Current medicines are reviewed at length with the patient today.  Concerns regarding medicines are outlined above.  Orders/Tests:  Orders Placed This Encounter  Procedures  . EKG 12-Lead  . ECHOCARDIOGRAM COMPLETE   Medication changes: No orders of the defined types were placed in this encounter.  Signed, Richardson Dopp, PA-C  09/18/2016 3:46 PM    [facility] [address], [place], [place]  [address] Phone: [phone]; Fax: [phone]

## 2016-09-30 LAB — HM COLONOSCOPY

## 2016-10-17 ENCOUNTER — Encounter: Payer: Self-pay | Admitting: Internal Medicine

## 2016-11-29 ENCOUNTER — Telehealth: Payer: Self-pay | Admitting: Emergency Medicine

## 2016-11-29 DIAGNOSIS — E7800 Pure hypercholesterolemia, unspecified: Secondary | ICD-10-CM

## 2016-11-29 NOTE — Telephone Encounter (Signed)
LVM with pt to verify that she has to have Brand Crestor.   PA completed: Key [id number] awaiting response.

## 2016-12-10 NOTE — Telephone Encounter (Signed)
Call patient - ask her what she has tried in the past and if she has had side effects to anything.  Let her know her insurance will not cover the crestor and we need to try something different.

## 2016-12-10 NOTE — Telephone Encounter (Signed)
PA was denied. Please advise

## 2016-12-11 MED ORDER — ATORVASTATIN CALCIUM 40 MG PO TABS: 40.0000 mg | ORAL_TABLET | Freq: Every day | ORAL | 3 refills | Status: DC

## 2016-12-11 NOTE — Telephone Encounter (Signed)
Pt states she has not used anything but the crestor and rosuvastatin. The crestor has managed her cholesterol. But she is willing to try another med. Please send to [facility] on [address]. Pt is aware to contact office if an side effects occur.

## 2016-12-11 NOTE — Telephone Encounter (Signed)
LVM for pt to call back and discuss.  

## 2016-12-11 NOTE — Telephone Encounter (Signed)
She did not tolerate generic crestor and it was not effective.  Insurance will not pay for brand crestor    Will try lipitor 40 mg daily.  Sent to [facility]

## 2016-12-12 NOTE — Telephone Encounter (Signed)
PT is aware.

## 2016-12-22 ENCOUNTER — Other Ambulatory Visit: Payer: Self-pay | Admitting: Internal Medicine

## 2017-01-21 ENCOUNTER — Ambulatory Visit: Payer: 59 | Admitting: Internal Medicine

## 2017-02-12 NOTE — Progress Notes (Signed)
Subjective:    Patient ID: Kathleen Lucero, female    DOB: [birth year], 61 y.o.   MRN: [id number]  HPI The patient is here for follow up.  Hyperlipidemia: She is taking her medication daily. She is not compliant with a low fat/cholesterol diet. She is not exercising regularly, but when she left here 6 months ago she started exercising regularly for 3 months. She denies myalgias.   Hypertension: She is taking her medication daily. She is not compliant with a low sodium diet.  She denies chest pain, palpitations, edema, shortness of breath and regular headaches. She is exercising regularly.  She does not monitor her blood pressure at home.    Diabetes: She is taking her medication daily as prescribed. She is compliant with a diabetic diet. She is exercising regularly. She monitors her sugars and they have been running XXX. She checks her feet daily and denies foot lesions. She is up-to-date with an ophthalmology examination.     Medications and allergies reviewed with patient and updated if appropriate.  Patient Active Problem List   Diagnosis Date Noted  . Bicuspid aortic valve 09/18/2016  . Mitral valve insufficiency 09/18/2016  . Type 2 diabetes mellitus without complication, without long-term current use of insulin ([place]) 09/18/2016  . Undiagnosed cardiac murmurs 02/06/2016  . Fibroids, intramural 03/28/2015  . Obesity (BMI 30-39.9) 11/20/2013  . History of colonic polyps 11/19/2013  . Nonspecific abnormal electrocardiogram (ECG) (EKG) 10/04/2011  . History of hyperthyroidism 11/02/2008  . GOITER, MULTINODULAR 09/13/2008  . GERD 08/30/2008  . [place] DISEASE, LUMBOSACRAL SPINE 08/30/2008  . Hyperlipidemia 11/05/2006  . Essential hypertension 11/05/2006    Current Outpatient Prescriptions on File Prior to Visit  Medication Sig Dispense Refill  . atorvastatin (LIPITOR) 40 MG tablet Take 1 tablet (40 mg total) by mouth daily. 90 tablet 3  . diltiazem (CARDIZEM CD) 180 MG 24 hr  capsule TAKE 1 CAPSULE (180 MG TOTAL) BY MOUTH DAILY. 90 capsule 0  . spironolactone (ALDACTONE) 25 MG tablet Take 1 tablet (25 mg total) by mouth 2 (two) times daily. 180 tablet 3   No current facility-administered medications on file prior to visit.     Past Medical History:  Diagnosis Date  . Diabetes mellitus ([place])    A1c 6.7 in 3/18  . GERD (gastroesophageal reflux disease) 2007  . Goiter   . Heart murmur    "my whole life"  . Hyperlipidemia   . Hypertension   . Lumbar degenerative disc disease    S/P ruptured disc, Dr. Lindwood Qua  . T3 thyrotoxicosis 2011   S/P RAI  . Uterine fibroid    Dr Sabra Heck    Past Surgical History:  Procedure Laterality Date  . colonoscopy with polypectomy  2011   Dr Floria Raveling done 2013 as recommended  . TUBAL LIGATION  1981  . WISDOM TOOTH EXTRACTION      Social History   Social History  . Marital status: Married    Spouse name: N/A  . Number of children: 3  . Years of education: N/A   Occupational History  . 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 Smokeless tobacco: Never Used  . Alcohol use No  . Drug use: No  . Sexual activity: No     Comment: BTL   Other Topics Concern  . Not on file   Social History Narrative   Walks 2X wkly   3 sons, 6 grandchildren  Moved from [place], [place]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    Family History  Problem Relation Age of Onset  . Hypertension Mother   . Stroke Mother 35       5 days post partum  . Alzheimer's disease Mother   . Hypertension Brother         2 brothers  . Cancer Brother 52       colorectal  . Hypertension Maternal Grandmother   . Thyroid disease Maternal Grandmother        hypothyroidism  . Diabetes Maternal Grandmother        diet controlled  . Dementia Maternal Grandmother 67  . Alzheimer's disease Maternal Grandmother   . Dementia Paternal Aunt   . Heart attack Neg Hx     Review of Systems  Constitutional: Negative for  chills and fever.  HENT: Positive for congestion and sneezing.   Respiratory: Negative for cough, shortness of breath and wheezing.   Cardiovascular: Negative for chest pain, palpitations and leg swelling.  Neurological: Positive for headaches (sinus). Negative for light-headedness.       Objective:   Vitals:   02/13/17 0857  BP: 128/88  Pulse: 74  Resp: 16  Temp: 98.4 F (36.9 C)  SpO2: 97%   Wt Readings from Last 3 Encounters:  02/13/17 235 lb (106.6 kg)  09/18/16 237 lb 1.9 oz (107.6 kg)  08/06/16 236 lb 11.2 oz (107.4 kg)   Body mass index is 36.81 kg/m.   Physical Exam    Constitutional: Appears well-developed and well-nourished. No distress.  HENT:  Head: Normocephalic and atraumatic.  Neck: Neck supple. No tracheal deviation present. No thyromegaly present.  No cervical lymphadenopathy Cardiovascular: Normal rate, regular rhythm and normal heart sounds.   2/6 sys murmur heard. No carotid bruit .  No edema Pulmonary/Chest: Effort normal and breath sounds normal. No respiratory distress. No has no wheezes. No rales.  Skin: Skin is warm and dry. Not diaphoretic.  Psychiatric: Normal mood and affect. Behavior is normal.      Assessment & Plan:    See Problem List for Assessment and Plan of chronic medical problems.

## 2017-02-13 ENCOUNTER — Other Ambulatory Visit (INDEPENDENT_AMBULATORY_CARE_PROVIDER_SITE_OTHER): Payer: BLUE CROSS/BLUE SHIELD

## 2017-02-13 ENCOUNTER — Encounter: Payer: Self-pay | Admitting: Internal Medicine

## 2017-02-13 ENCOUNTER — Ambulatory Visit (INDEPENDENT_AMBULATORY_CARE_PROVIDER_SITE_OTHER): Payer: BLUE CROSS/BLUE SHIELD | Admitting: Internal Medicine

## 2017-02-13 VITALS — BP 128/88 | HR 74 | Temp 98.4°F | Resp 16 | Wt 235.0 lb

## 2017-02-13 DIAGNOSIS — I10 Essential (primary) hypertension: Secondary | ICD-10-CM

## 2017-02-13 DIAGNOSIS — E7800 Pure hypercholesterolemia, unspecified: Secondary | ICD-10-CM

## 2017-02-13 DIAGNOSIS — E119 Type 2 diabetes mellitus without complications: Secondary | ICD-10-CM | POA: Diagnosis not present

## 2017-02-13 LAB — LIPID PANEL
Cholesterol: 189 mg/dL (ref 0–200)
HDL: 48.1 mg/dL (ref >39.00)
LDL Cholesterol: 120 mg/dL — ABNORMAL HIGH (ref 0–99)
NONHDL: 140.6
TRIGLYCERIDES: 103 mg/dL (ref 0.0–149.0)
Total CHOL/HDL Ratio: 4
VLDL: 20.6 mg/dL (ref 0.0–40.0)

## 2017-02-13 LAB — COMPREHENSIVE METABOLIC PANEL
ALBUMIN: 4.7 g/dL (ref 3.5–5.2)
ALT: 15 U/L (ref 0–35)
AST: 14 U/L (ref 0–37)
Alkaline Phosphatase: 99 U/L (ref 39–117)
BUN: 13 mg/dL (ref 6–23)
CHLORIDE: 103 meq/L (ref 96–112)
CO2: 27 mEq/L (ref 19–32)
Calcium: 10.1 mg/dL (ref 8.4–10.5)
Creatinine, Ser: 1.13 mg/dL (ref 0.40–1.20)
GFR: 63.14 mL/min (ref >60.00)
Glucose, Bld: 118 mg/dL — ABNORMAL HIGH (ref 70–99)
Potassium: 3.9 mEq/L (ref 3.5–5.1)
SODIUM: 138 meq/L (ref 135–145)
Total Bilirubin: 0.7 mg/dL (ref 0.2–1.2)
Total Protein: 8.1 g/dL (ref 6.0–8.3)

## 2017-02-13 LAB — MICROALBUMIN / CREATININE URINE RATIO
Creatinine,U: 215.5 mg/dL
MICROALB UR: 2.1 mg/dL — AB (ref 0.0–1.9)
MICROALB/CREAT RATIO: 1 mg/g (ref 0.0–30.0)

## 2017-02-13 LAB — HEMOGLOBIN A1C: HEMOGLOBIN A1C: 6.6 % — AB (ref 4.6–6.5)

## 2017-02-13 NOTE — Assessment & Plan Note (Signed)
Check lipid panel  Continue daily statin Regular exercise and healthy diet encouraged  

## 2017-02-13 NOTE — Assessment & Plan Note (Signed)
BP well controlled Current regimen effective and well tolerated Continue current medications at current doses cmp  

## 2017-02-13 NOTE — Assessment & Plan Note (Signed)
Check a1c Low sugar / carb diet Stressed regular exercise, weight loss  

## 2017-02-13 NOTE — Patient Instructions (Signed)

## 2017-04-05 ENCOUNTER — Other Ambulatory Visit: Payer: Self-pay | Admitting: Internal Medicine

## 2017-06-02 ENCOUNTER — Encounter: Payer: Self-pay | Admitting: Obstetrics & Gynecology

## 2017-08-13 NOTE — Patient Instructions (Addendum)
Test(s) ordered today. Your results will be released to [facility] (or called to you) after review, usually within 72hours after test completion. If any changes need to be made, you will be notified at that same time.  All other Health Maintenance issues reviewed.   All recommended immunizations and age-appropriate screenings are up-to-date or discussed.  No immunizations administered today.   Medications reviewed and updated.  Changes include restarting crestor and stopping lipitor.  Your prescription(s) have been submitted to your pharmacy. Please take as directed and contact our office if you believe you are having problem(s) with the medication(s).    Please followup in 6 months   Health Maintenance, Female Adopting a healthy lifestyle and getting preventive care can go a long way to promote health and wellness. Talk with your health care provider about what schedule of regular examinations is right for you. This is a good chance for you to check in with your provider about disease prevention and staying healthy. In between checkups, there are plenty of things you can do on your own. Experts have done a lot of research about which lifestyle changes and preventive measures are most likely to keep you healthy. Ask your health care provider for more information. Weight and diet Eat a healthy diet  Be sure to include plenty of vegetables, fruits, low-fat dairy products, and lean protein.  Do not eat a lot of foods high in solid fats, added sugars, or salt.  Get regular exercise. This is one of the most important things you can do for your health. ? Most adults should exercise for at least 150 minutes each week. The exercise should increase your heart rate and make you sweat (moderate-intensity exercise). ? Most adults should also do strengthening exercises at least twice a week. This is in addition to the moderate-intensity exercise.  Maintain a healthy weight  Body mass index (BMI) is a  measurement that can be used to identify possible weight problems. It estimates body fat based on height and weight. Your health care provider can help determine your BMI and help you achieve or maintain a healthy weight.  For females 6 years of age and older: ? A BMI below 18.5 is considered underweight. ? A BMI of 18.5 to 24.9 is normal. ? A BMI of 25 to 29.9 is considered overweight. ? A BMI of 30 and above is considered obese.  Watch levels of cholesterol and blood lipids  You should start having your blood tested for lipids and cholesterol at 60 years of age, then have this test every 5 years.  You may need to have your cholesterol levels checked more often if: ? Your lipid or cholesterol levels are high. ? You are older than 61 years of age. ? You are at high risk for heart disease.  Cancer screening Lung Cancer  Lung cancer screening is recommended for adults 60-52 years old who are at high risk for lung cancer because of a history of smoking.  A yearly low-dose CT scan of the lungs is recommended for people who: ? Currently smoke. ? Have quit within the past 15 years. ? Have at least a 30-pack-year history of smoking. A pack year is smoking an average of one pack of cigarettes a day for 1 year.  Yearly screening should continue until it has been 15 years since you quit.  Yearly screening should stop if you develop a health problem that would prevent you from having lung cancer treatment.  Breast Cancer  Practice  breast self-awareness. This means understanding how your breasts normally appear and feel.  It also means doing regular breast self-exams. Let your health care provider know about any changes, no matter how small.  If you are in your 20s or 30s, you should have a clinical breast exam (CBE) by a health care provider every 1-3 years as part of a regular health exam.  If you are 49 or older, have a CBE every year. Also consider having a breast X-ray (mammogram)  every year.  If you have a family history of breast cancer, talk to your health care provider about genetic screening.  If you are at high risk for breast cancer, talk to your health care provider about having an MRI and a mammogram every year.  Breast cancer gene (BRCA) assessment is recommended for women who have family members with BRCA-related cancers. BRCA-related cancers include: ? Breast. ? Ovarian. ? Tubal. ? Peritoneal cancers.  Results of the assessment will determine the need for genetic counseling and BRCA1 and BRCA2 testing.  Cervical Cancer Your health care provider may recommend that you be screened regularly for cancer of the pelvic organs (ovaries, uterus, and vagina). This screening involves a pelvic examination, including checking for microscopic changes to the surface of your cervix (Pap test). You may be encouraged to have this screening done every 3 years, beginning at age 67.  For women ages 52-65, health care providers may recommend pelvic exams and Pap testing every 3 years, or they may recommend the Pap and pelvic exam, combined with testing for human papilloma virus (HPV), every 5 years. Some types of HPV increase your risk of cervical cancer. Testing for HPV may also be done on women of any age with unclear Pap test results.  Other health care providers may not recommend any screening for nonpregnant women who are considered low risk for pelvic cancer and who do not have symptoms. Ask your health care provider if a screening pelvic exam is right for you.  If you have had past treatment for cervical cancer or a condition that could lead to cancer, you need Pap tests and screening for cancer for at least 20 years after your treatment. If Pap tests have been discontinued, your risk factors (such as having a new sexual partner) need to be reassessed to determine if screening should resume. Some women have medical problems that increase the chance of getting cervical  cancer. In these cases, your health care provider may recommend more frequent screening and Pap tests.  Colorectal Cancer  This type of cancer can be detected and often prevented.  Routine colorectal cancer screening usually begins at 61 years of age and continues through 61 years of age.  Your health care provider may recommend screening at an earlier age if you have risk factors for colon cancer.  Your health care provider may also recommend using home test kits to check for hidden blood in the stool.  A small camera at the end of a tube can be used to examine your colon directly (sigmoidoscopy or colonoscopy). This is done to check for the earliest forms of colorectal cancer.  Routine screening usually begins at age 40.  Direct examination of the colon should be repeated every 5-10 years through 61 years of age. However, you may need to be screened more often if early forms of precancerous polyps or small growths are found.  Skin Cancer  Check your skin from head to toe regularly.  Tell your health care provider  about any new moles or changes in moles, especially if there is a change in a mole's shape or color.  Also tell your health care provider if you have a mole that is larger than the size of a pencil eraser.  Always use sunscreen. Apply sunscreen liberally and repeatedly throughout the day.  Protect yourself by wearing long sleeves, pants, a wide-brimmed hat, and sunglasses whenever you are outside.  Heart disease, diabetes, and high blood pressure  High blood pressure causes heart disease and increases the risk of stroke. High blood pressure is more likely to develop in: ? People who have blood pressure in the high end of the normal range (130-139/85-89 mm Hg). ? People who are overweight or obese. ? People who are African American.  If you are 83-3 years of age, have your blood pressure checked every 3-5 years. If you are 79 years of age or older, have your blood  pressure checked every year. You should have your blood pressure measured twice-once when you are at a hospital or clinic, and once when you are not at a hospital or clinic. Record the average of the two measurements. To check your blood pressure when you are not at a hospital or clinic, you can use: ? An automated blood pressure machine at a pharmacy. ? A home blood pressure monitor.  If you are between 95 years and 47 years old, ask your health care provider if you should take aspirin to prevent strokes.  Have regular diabetes screenings. This involves taking a blood sample to check your fasting blood sugar level. ? If you are at a normal weight and have a low risk for diabetes, have this test once every three years after 61 years of age. ? If you are overweight and have a high risk for diabetes, consider being tested at a younger age or more often. Preventing infection Hepatitis B  If you have a higher risk for hepatitis B, you should be screened for this virus. You are considered at high risk for hepatitis B if: ? You were born in a country where hepatitis B is common. Ask your health care provider which countries are considered high risk. ? Your parents were born in a high-risk country, and you have not been immunized against hepatitis B (hepatitis B vaccine). ? You have HIV or AIDS. ? You use needles to inject street drugs. ? You live with someone who has hepatitis B. ? You have had sex with someone who has hepatitis B. ? You get hemodialysis treatment. ? You take certain medicines for conditions, including cancer, organ transplantation, and autoimmune conditions.  Hepatitis C  Blood testing is recommended for: ? Everyone born from [birth year] through 1964. ? Anyone with known risk factors for hepatitis C.  Sexually transmitted infections (STIs)  You should be screened for sexually transmitted infections (STIs) including gonorrhea and chlamydia if: ? You are sexually active and are  younger than 61 years of age. ? You are older than 61 years of age and your health care provider tells you that you are at risk for this type of infection. ? Your sexual activity has changed since you were last screened and you are at an increased risk for chlamydia or gonorrhea. Ask your health care provider if you are at risk.  If you do not have HIV, but are at risk, it may be recommended that you take a prescription medicine daily to prevent HIV infection. This is called pre-exposure prophylaxis (PrEP). You are  considered at risk if: ? You are sexually active and do not regularly use condoms or know the HIV status of your partner(s). ? You take drugs by injection. ? You are sexually active with a partner who has HIV.  Talk with your health care provider about whether you are at high risk of being infected with HIV. If you choose to begin PrEP, you should first be tested for HIV. You should then be tested every 3 months for as long as you are taking PrEP. Pregnancy  If you are premenopausal and you may become pregnant, ask your health care provider about preconception counseling.  If you may become pregnant, take 400 to 800 micrograms (mcg) of folic acid every day.  If you want to prevent pregnancy, talk to your health care provider about birth control (contraception). Osteoporosis and menopause  Osteoporosis is a disease in which the bones lose minerals and strength with aging. This can result in serious bone fractures. Your risk for osteoporosis can be identified using a bone density scan.  If you are 80 years of age or older, or if you are at risk for osteoporosis and fractures, ask your health care provider if you should be screened.  Ask your health care provider whether you should take a calcium or vitamin D supplement to lower your risk for osteoporosis.  Menopause may have certain physical symptoms and risks.  Hormone replacement therapy may reduce some of these symptoms and  risks. Talk to your health care provider about whether hormone replacement therapy is right for you. Follow these instructions at home:  Schedule regular health, dental, and eye exams.  Stay current with your immunizations.  Do not use any tobacco products including cigarettes, chewing tobacco, or electronic cigarettes.  If you are pregnant, do not drink alcohol.  If you are breastfeeding, limit how much and how often you drink alcohol.  Limit alcohol intake to no more than 1 drink per day for nonpregnant women. One drink equals 12 ounces of beer, 5 ounces of wine, or 1 ounces of hard liquor.  Do not use street drugs.  Do not share needles.  Ask your health care provider for help if you need support or information about quitting drugs.  Tell your health care provider if you often feel depressed.  Tell your health care provider if you have ever been abused or do not feel safe at home. This information is not intended to replace advice given to you by your health care provider. Make sure you discuss any questions you have with your health care provider. Document Released: 11/12/2010 Document Revised: 10/05/2015 Document Reviewed: 01/31/2015 Elsevier Interactive Patient Education  [employer].

## 2017-08-13 NOTE — Progress Notes (Signed)
Subjective:    Patient ID: Kathleen Lucero, female    DOB: 03-Aug-1955, 62 y.o.   MRN: [id number]  HPI She is here for a physical exam.   She is still working a lot and under a great deal of stress from work.  Due to the stress she does not be as healthy as she should.  He is currently not exercising regularly.  She was eating a biscuit every morning for breakfast and she has stopped that.  She is trying to make other small changes in her diet.  She stopped drinking soda, but has been drinking tea with sugar in it and juices.  She knows she needs to make other changes.  Besides the stress she otherwise feels good.  She has noticed some cramping in her muscles and wonders if it is from the Lipitor.  She was on Crestor previously and would like to go back on that.  Medications and allergies reviewed with patient and updated if appropriate.  Patient Active Problem List   Diagnosis Date Noted  . Bicuspid aortic valve 09/18/2016  . Mitral valve insufficiency 09/18/2016  . Type 2 diabetes mellitus without complication, without long-term current use of insulin ([place]) 09/18/2016  . Undiagnosed cardiac murmurs 02/06/2016  . Fibroids, intramural 03/28/2015  . Obesity (BMI 30-39.9) 11/20/2013  . History of colonic polyps 11/19/2013  . Nonspecific abnormal electrocardiogram (ECG) (EKG) 10/04/2011  . History of hyperthyroidism 11/02/2008  . GOITER, MULTINODULAR 09/13/2008  . GERD 08/30/2008  . [place] DISEASE, LUMBOSACRAL SPINE 08/30/2008  . Hyperlipidemia 11/05/2006  . Essential hypertension 11/05/2006    Current Outpatient Medications on File Prior to Visit  Medication Sig Dispense Refill  . atorvastatin (LIPITOR) 40 MG tablet Take 1 tablet (40 mg total) by mouth daily. 90 tablet 3  . diltiazem (CARDIZEM CD) 180 MG 24 hr capsule TAKE 1 CAPSULE (180 MG TOTAL) BY MOUTH DAILY. 90 capsule 1  . spironolactone (ALDACTONE) 25 MG tablet Take 1 tablet (25 mg total) by mouth 2 (two) times daily. 180  tablet 3   No current facility-administered medications on file prior to visit.     Past Medical History:  Diagnosis Date  . Diabetes mellitus ([place])    A1c 6.7 in 3/18  . GERD (gastroesophageal reflux disease) 2007  . Goiter   . Heart murmur    "my whole life"  . Hyperlipidemia   . Hypertension   . Lumbar degenerative disc disease    S/P ruptured disc, Dr. Lindwood Qua  . T3 thyrotoxicosis 2011   S/P RAI  . Uterine fibroid    Dr Sabra Heck    Past Surgical History:  Procedure Laterality Date  . colonoscopy with polypectomy  2011   Dr Floria Raveling done 2013 as recommended  . TUBAL LIGATION  1981  . WISDOM TOOTH EXTRACTION      Social History   Socioeconomic History  . Marital status: Married    Spouse name: Not on file  . Number of children: 3  . Years of education: Not on file  . Highest education level: Not on file  Occupational History  . Occupation: Best boy: [employer]  . Financial resource strain: Not on file  . Food insecurity:    Worry: Not on file    Inability: Not on file  . Transportation needs:    Medical: Not on file    Non-medical: Not on file  Tobacco Use  . Smoking status: Never Smoker  .  Smokeless tobacco: Never Used  Substance and Sexual Activity  . Alcohol use: No  . Drug use: No  . Sexual activity: Never    Partners: Male    Birth control/protection: Surgical, Post-menopausal    Comment: BTL  Lifestyle  . Physical activity:    Days per week: Not on file    Minutes per session: Not on file  . Stress: Not on file  Relationships  . Social connections:    Talks on phone: Not on file    Gets together: Not on file    Attends religious service: Not on file    Active member of club or organization: Not on file    Attends meetings of clubs or organizations: Not on file    Relationship status: Not on file  Other Topics Concern  . Not on file  Social History Narrative   Walks 2X wkly   3 sons, 6 grandchildren     [place] from [place], [place]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    Family History  Problem Relation Age of Onset  . Hypertension Mother   . Stroke Mother 35       5 days post partum  . Alzheimer's disease Mother   . Hypertension Brother         2 brothers  . Cancer Brother 12       colorectal  . Hypertension Maternal Grandmother   . Thyroid disease Maternal Grandmother        hypothyroidism  . Diabetes Maternal Grandmother        diet controlled  . Dementia Maternal Grandmother 94  . Alzheimer's disease Maternal Grandmother   . Dementia Paternal Aunt   . Heart attack Neg Hx     Review of Systems  Constitutional: Negative for chills and fever.  Eyes: Negative for visual disturbance.  Respiratory: Negative for cough, shortness of breath and wheezing.   Cardiovascular: Negative for chest pain, palpitations and leg swelling.  Gastrointestinal: Positive for constipation. Negative for abdominal pain, blood in stool, diarrhea and nausea.       No gerd  Genitourinary: Negative for dysuria and hematuria.  Musculoskeletal: Positive for myalgias (leg cramping - ? lipitor). Negative for arthralgias and back pain.  Skin: Negative for color change and rash.  Neurological: Positive for headaches (occasional).  Psychiatric/Behavioral: Negative for dysphoric mood. The patient is not nervous/anxious.        Objective:   Vitals:   08/14/17 0908  BP: 130/86  Pulse: 75  Resp: 16  Temp: 98.4 F (36.9 C)  SpO2: 97%   Filed Weights   08/14/17 0908  Weight: 234 lb (106.1 kg)   Body mass index is 36.65 kg/m.  Wt Readings from Last 3 Encounters:  08/14/17 234 lb (106.1 kg)  02/13/17 235 lb (106.6 kg)  09/18/16 237 lb 1.9 oz (107.6 kg)     Physical Exam Constitutional: She appears well-developed and well-nourished. No distress.  HENT:  Head: Normocephalic and atraumatic.  Right Ear: External ear normal. Normal ear canal and TM Left Ear: External ear normal.  Normal ear canal and TM Mouth/Throat:  Oropharynx is clear and moist.  Eyes: Conjunctivae and EOM are normal.  Neck: Neck supple. No tracheal deviation present. thyromegaly present.  No carotid bruit  Cardiovascular: Normal rate, regular rhythm and normal heart sounds.   No murmur heard.  No edema. Pulmonary/Chest: Effort normal and breath sounds normal. No respiratory distress. She has no wheezes. She has no rales.  Breast: deferred to Gyn Abdominal: Soft.  She exhibits no distension. There is no tenderness.  Lymphadenopathy: She has no cervical adenopathy.  Skin: Skin is warm and dry. She is not diaphoretic.  Psychiatric: She has a normal mood and affect. Her behavior is normal.        Assessment & Plan:   Physical exam: Screening blood work   ordered Immunizations  Prevnar, td and shingrix discussed-deferred pneumonia and tetanus vaccines Colonoscopy   Up to date  Mammogram     Up to date  Gyn    Up to date  Eye exams  Not up to date - will schedule EKG    Done 09/2016 Exercise - none-advised trying to start exercising 10 minutes a couple of times a week and slowly build from there Weight  Advised weight loss Skin   No concerns Substance abuse  none  See Problem List for Assessment and Plan of chronic medical problems.  Follow-up in 6 months

## 2017-08-14 ENCOUNTER — Encounter: Payer: Self-pay | Admitting: Internal Medicine

## 2017-08-14 ENCOUNTER — Ambulatory Visit (INDEPENDENT_AMBULATORY_CARE_PROVIDER_SITE_OTHER): Payer: BLUE CROSS/BLUE SHIELD | Admitting: Internal Medicine

## 2017-08-14 ENCOUNTER — Other Ambulatory Visit (INDEPENDENT_AMBULATORY_CARE_PROVIDER_SITE_OTHER): Payer: BLUE CROSS/BLUE SHIELD

## 2017-08-14 VITALS — BP 130/86 | HR 75 | Temp 98.4°F | Resp 16 | Ht 67.0 in | Wt 234.0 lb

## 2017-08-14 DIAGNOSIS — I10 Essential (primary) hypertension: Secondary | ICD-10-CM

## 2017-08-14 DIAGNOSIS — E669 Obesity, unspecified: Secondary | ICD-10-CM | POA: Diagnosis not present

## 2017-08-14 DIAGNOSIS — Z0000 Encounter for general adult medical examination without abnormal findings: Secondary | ICD-10-CM

## 2017-08-14 DIAGNOSIS — E042 Nontoxic multinodular goiter: Secondary | ICD-10-CM

## 2017-08-14 DIAGNOSIS — Z8639 Personal history of other endocrine, nutritional and metabolic disease: Secondary | ICD-10-CM

## 2017-08-14 DIAGNOSIS — E7849 Other hyperlipidemia: Secondary | ICD-10-CM

## 2017-08-14 DIAGNOSIS — E119 Type 2 diabetes mellitus without complications: Secondary | ICD-10-CM | POA: Diagnosis not present

## 2017-08-14 DIAGNOSIS — Q231 Congenital insufficiency of aortic valve: Secondary | ICD-10-CM

## 2017-08-14 LAB — CBC WITH DIFFERENTIAL/PLATELET
BASOS ABS: 0.1 10*3/uL (ref 0.0–0.1)
Basophils Relative: 1.3 % (ref 0.0–3.0)
EOS PCT: 3.9 % (ref 0.0–5.0)
Eosinophils Absolute: 0.2 10*3/uL (ref 0.0–0.7)
HCT: 43 % (ref 36.0–46.0)
HEMOGLOBIN: 14.2 g/dL (ref 12.0–15.0)
Lymphocytes Relative: 37.6 % (ref 12.0–46.0)
Lymphs Abs: 2.1 10*3/uL (ref 0.7–4.0)
MCHC: 33 g/dL (ref 30.0–36.0)
MCV: 83.8 fl (ref 78.0–100.0)
MONOS PCT: 7.3 % (ref 3.0–12.0)
Monocytes Absolute: 0.4 10*3/uL (ref 0.1–1.0)
Neutro Abs: 2.8 10*3/uL (ref 1.4–7.7)
Neutrophils Relative %: 49.9 % (ref 43.0–77.0)
Platelets: 225 10*3/uL (ref 150.0–400.0)
RBC: 5.14 Mil/uL — AB (ref 3.87–5.11)
RDW: 13.7 % (ref 11.5–15.5)
WBC: 5.6 10*3/uL (ref 4.0–10.5)

## 2017-08-14 LAB — LIPID PANEL
CHOLESTEROL: 179 mg/dL (ref 0–200)
HDL: 49.5 mg/dL (ref >39.00)
LDL Cholesterol: 112 mg/dL — ABNORMAL HIGH (ref 0–99)
NonHDL: 129.93
Total CHOL/HDL Ratio: 4
Triglycerides: 88 mg/dL (ref 0.0–149.0)
VLDL: 17.6 mg/dL (ref 0.0–40.0)

## 2017-08-14 LAB — TSH: TSH: 2.32 u[IU]/mL (ref 0.35–4.50)

## 2017-08-14 LAB — COMPREHENSIVE METABOLIC PANEL
ALBUMIN: 4.6 g/dL (ref 3.5–5.2)
ALK PHOS: 111 U/L (ref 39–117)
ALT: 18 U/L (ref 0–35)
AST: 15 U/L (ref 0–37)
BILIRUBIN TOTAL: 0.7 mg/dL (ref 0.2–1.2)
BUN: 15 mg/dL (ref 6–23)
CO2: 26 mEq/L (ref 19–32)
Calcium: 10.1 mg/dL (ref 8.4–10.5)
Chloride: 104 mEq/L (ref 96–112)
Creatinine, Ser: 1.11 mg/dL (ref 0.40–1.20)
GFR: 64.35 mL/min (ref >60.00)
Glucose, Bld: 113 mg/dL — ABNORMAL HIGH (ref 70–99)
POTASSIUM: 4 meq/L (ref 3.5–5.1)
Sodium: 141 mEq/L (ref 135–145)
Total Protein: 8.1 g/dL (ref 6.0–8.3)

## 2017-08-14 LAB — HEMOGLOBIN A1C: Hgb A1c MFr Bld: 6.6 % — ABNORMAL HIGH (ref 4.6–6.5)

## 2017-08-14 MED ORDER — ROSUVASTATIN CALCIUM 20 MG PO TABS: 20.0000 mg | ORAL_TABLET | Freq: Every day | ORAL | 3 refills | Status: DC

## 2017-08-14 NOTE — Assessment & Plan Note (Signed)
Check a1c Low sugar / carb diet Stressed regular exercise, weight loss Will schedule eye exam

## 2017-08-14 NOTE — Assessment & Plan Note (Signed)
She knows the importance of losing weight Advised trying to start exercise slowly and build from there-start 10 minutes twice weekly and increase Discussed more healthy choices of foods and beverages

## 2017-08-14 NOTE — Assessment & Plan Note (Signed)
Possible leg cramping with Lipitor-we will discontinue Restart Crestor-taking 10 mg 3 times a week Lipid panel today and will recheck in 6 months

## 2017-08-14 NOTE — Assessment & Plan Note (Signed)
Thyroid approximately the same size-she denies any difficulty swallowing or feeling that the thyroid has gotten larger Check TSH

## 2017-08-14 NOTE — Assessment & Plan Note (Signed)
BP well controlled Current regimen effective and well tolerated Continue current medications at current doses cmp  

## 2017-08-14 NOTE — Assessment & Plan Note (Signed)
Check TSH 

## 2017-08-14 NOTE — Assessment & Plan Note (Signed)
Has follow-up with cardiology next month

## 2017-10-24 ENCOUNTER — Encounter: Payer: Self-pay | Admitting: Obstetrics & Gynecology

## 2017-10-24 ENCOUNTER — Ambulatory Visit: Payer: BLUE CROSS/BLUE SHIELD | Admitting: Obstetrics & Gynecology

## 2017-10-24 ENCOUNTER — Other Ambulatory Visit: Payer: Self-pay

## 2017-10-24 VITALS — BP 140/86 | HR 64 | Resp 16 | Ht 67.0 in | Wt 236.6 lb

## 2017-10-24 DIAGNOSIS — R7303 Prediabetes: Secondary | ICD-10-CM

## 2017-10-24 DIAGNOSIS — E2839 Other primary ovarian failure: Secondary | ICD-10-CM | POA: Diagnosis not present

## 2017-10-24 DIAGNOSIS — Z01419 Encounter for gynecological examination (general) (routine) without abnormal findings: Secondary | ICD-10-CM

## 2017-10-24 DIAGNOSIS — Z6837 Body mass index (BMI) 37.0-37.9, adult: Secondary | ICD-10-CM | POA: Diagnosis not present

## 2017-10-24 NOTE — Progress Notes (Signed)
62 y.o. [id number] MarriedAfrican AmericanF here for annual exam.  Doing well.  Denies vaginal bleeding.  Had echo done last March.  Had bicuspid aortic value.  Did see Richardson Dopp with cardiology.    HbA1C 6.6.  Dr. Quay Burow would like her to try and lose weight but she states work is still very stressful.  Reports so many changes this past year.  D/w pt Dr. Leafy Ro as a possible consideration for weight loss.     Denies vaginal bleeding.    Patient's last menstrual period was 05/14/2007.          Sexually active: yes The current method of family planning is post menopausal status.    Exercising: Yes.    walking Smoker:  no  Health Maintenance: Pap:  07/19/16 Neg. HR HPV:neg   02/14/14 Neg  History of abnormal Pap:  no MMG:  05/09/17 BIRADS1:neg  Colonoscopy:  09/30/16 polyps. F/u 5 years  BMD:   2008 TDaP:  Unsure.  Declines having this updated.   Pneumonia vaccine(s):  n/a Shingrix:  D/w pt today.  Declines.   Hep C testing: No Screening Labs: PCP   reports that she has never smoked. She has never used smokeless tobacco. She reports that she does not drink alcohol or use drugs.  Past Medical History:  Diagnosis Date  . Diabetes mellitus ([place])    A1c 6.7 in 3/18  . GERD (gastroesophageal reflux disease) 2007  . Goiter   . Heart murmur    "my whole life"  . Hyperlipidemia   . Hypertension   . Lumbar degenerative disc disease    S/P ruptured disc, Dr. Lindwood Qua  . T3 thyrotoxicosis 2011   S/P RAI  . Uterine fibroid    Dr Sabra Heck    Past Surgical History:  Procedure Laterality Date  . colonoscopy with polypectomy  2011   Dr Floria Raveling done 2013 as recommended  . TUBAL LIGATION  1981  . WISDOM TOOTH EXTRACTION      Current Outpatient Medications  Medication Sig Dispense Refill  . atorvastatin (LIPITOR) 40 MG tablet Take 40 mg by mouth daily.  3  . diltiazem (CARDIZEM CD) 180 MG 24 hr capsule TAKE 1 CAPSULE (180 MG TOTAL) BY MOUTH DAILY. 90 capsule 1  . Multiple  Vitamins-Minerals (HAIR SKIN & NAILS ADVANCED PO) Take by mouth daily.    Marland Kitchen spironolactone (ALDACTONE) 25 MG tablet Take 1 tablet (25 mg total) by mouth 2 (two) times daily. 180 tablet 3   No current facility-administered medications for this visit.     Family History  Problem Relation Age of Onset  . Hypertension Mother   . Stroke Mother 35       5 days post partum  . Alzheimer's disease Mother   . Hypertension Brother         2 brothers  . Cancer Brother 35       colorectal  . Hypertension Maternal Grandmother   . Thyroid disease Maternal Grandmother        hypothyroidism  . Diabetes Maternal Grandmother        diet controlled  . Dementia Maternal Grandmother 77  . Alzheimer's disease Maternal Grandmother   . Dementia Paternal Aunt   . Heart attack Neg Hx     Review of Systems  Constitutional:       Weight gain   Gastrointestinal: Positive for constipation.  All other systems reviewed and are negative.   Exam:   BP 140/86 (BP Location: Right Arm,  Patient Position: Sitting, Cuff Size: Large)   Pulse 64   Resp 16   Ht 5\' 7"  (1.702 m)   Wt 236 lb 9.6 oz (107.3 kg)   LMP 05/14/2007   BMI 37.06 kg/m     Height: 5\' 7"  (170.2 cm)  Ht Readings from Last 3 Encounters:  10/24/17 5\' 7"  (1.702 m)  08/14/17 5\' 7"  (1.702 m)  09/18/16 5\' 7"  (1.702 m)    General appearance: alert, cooperative and appears stated age Head: Normocephalic, without obvious abnormality, atraumatic Neck: no adenopathy, supple, symmetrical, trachea midline and thyroid normal to inspection and palpation Lungs: clear to auscultation bilaterally Breasts: normal appearance, no masses or tenderness Heart: regular rate and rhythm Abdomen: soft, non-tender; bowel sounds normal; no masses,  no organomegaly Extremities: extremities normal, atraumatic, no cyanosis or edema Skin: Skin color, texture, turgor normal. No rashes or lesions Lymph nodes: Cervical, supraclavicular, and axillary nodes normal. No  abnormal inguinal nodes palpated Neurologic: Grossly normal   Pelvic: External genitalia:  no lesions              Urethra:  normal appearing urethra with no masses, tenderness or lesions              Bartholins and Skenes: normal                 Vagina: normal appearing vagina with normal color and discharge, no lesions              Cervix: no lesions              Pap taken: No. Bimanual Exam:  Uterus:  Enlarged ~10 weeks size, c/w fibroids              Adnexa: no mass, fullness, tenderness               Rectovaginal: Confirms               Anus:  normal sphincter tone, no lesions  Chaperone was present for exam.  A:  Well Woman with normal exam PMP, no HRT Fibroid uterus.  Last done 8/11. Hypertension Elevated lipids Work stressors H/o enlarged thyroid with nodules, s/p RAIU H/O colon polyp and family hs of colon cancer in brother who now has recurrent in his liver Borderline diabetes  P:   Mammogram guidelines reviewed Recommend BMD with MMG in December pap smear with neg HR HPV 2018 Blood work is done withh next blood work Dr. Quay Burow.  Recommended having Hep C testing done with Dr. Quay Burow wit Declines Tdap and Shingrix vaccination today Referral will be made to Dr. Leafy Ro Return annually or prn

## 2017-10-24 NOTE — Patient Instructions (Addendum)
Plan to do a bone density with your mammogram in December.  I've already put in the order (into Epic).   Please see if Dr. Quay Burow will test you for Hepatitis C at your next visit with blood work.  Thanks.

## 2017-11-26 ENCOUNTER — Other Ambulatory Visit: Payer: Self-pay | Admitting: Internal Medicine

## 2017-12-15 ENCOUNTER — Other Ambulatory Visit: Payer: Self-pay | Admitting: Internal Medicine

## 2018-02-16 NOTE — Progress Notes (Signed)
Subjective:    Patient ID: Kathleen Lucero, female    DOB: 10/27/1955, 62 y.o.   MRN: [id number]  HPI The patient is here for follow up.  Hypertension: She is taking her medication daily. She is compliant with a low sodium diet.  She denies chest pain, palpitations, edema, shortness of breath and regular headaches. She is not exercising regularly.  She does not monitor her blood pressure at home.    Diabetes: She is taking her medication daily as prescribed. She is compliant with a diabetic diet. She is not exercising regularly.  She checks her feet daily and denies foot lesions. She is not up-to-date with an ophthalmology examination.   Hyperlipidemia: She is not currently taking her medication daily, but wants to restart it and would prefer the trade crestor. She is compliant with a low fat/cholesterol diet. She is not exercising regularly.      Medications and allergies reviewed with patient and updated if appropriate.  Patient Active Problem List   Diagnosis Date Noted  . Bicuspid aortic valve 09/18/2016  . Mitral valve insufficiency 09/18/2016  . Type 2 diabetes mellitus without complication, without long-term current use of insulin ([place]) 09/18/2016  . Fibroids, intramural 03/28/2015  . Obesity (BMI 30-39.9) 11/20/2013  . History of colonic polyps 11/19/2013  . Nonspecific abnormal electrocardiogram (ECG) (EKG) 10/04/2011  . History of hyperthyroidism 11/02/2008  . GOITER, MULTINODULAR 09/13/2008  . [place] DISEASE, LUMBOSACRAL SPINE 08/30/2008  . Hyperlipidemia 11/05/2006  . Essential hypertension 11/05/2006    Current Outpatient Medications on File Prior to Visit  Medication Sig Dispense Refill  . diltiazem (CARDIZEM CD) 180 MG 24 hr capsule TAKE 1 CAPSULE (180 MG TOTAL) BY MOUTH DAILY. 90 capsule 1  . Multiple Vitamins-Minerals (HAIR SKIN & NAILS ADVANCED PO) Take by mouth daily.    Marland Kitchen spironolactone (ALDACTONE) 25 MG tablet TAKE 1 TABLET BY MOUTH TWICE A DAY 180  tablet 3   No current facility-administered medications on file prior to visit.     Past Medical History:  Diagnosis Date  . Diabetes mellitus ([place])    A1c 6.7 in 3/18  . GERD (gastroesophageal reflux disease) 2007  . Goiter   . Heart murmur    "my whole life"  . Hyperlipidemia   . Hypertension   . Lumbar degenerative disc disease    S/P ruptured disc, Dr. Lindwood Qua  . T3 thyrotoxicosis 2011   S/P RAI  . Uterine fibroid    Dr Sabra Heck    Past Surgical History:  Procedure Laterality Date  . colonoscopy with polypectomy  2011   Dr Floria Raveling done 2013 as recommended  . TUBAL LIGATION  1981  . WISDOM TOOTH EXTRACTION      Social History   Socioeconomic History  . Marital status: Married    Spouse name: Not on file  . Number of children: 3  . Years of education: Not on file  . Highest education level: Not on file  Occupational History  . Occupation: Best boy: [employer]  . Financial resource strain: Not on file  . Food insecurity:    Worry: Not on file    Inability: Not on file  . Transportation needs:    Medical: Not on file    Non-medical: Not on file  Tobacco Use  . Smoking status: Never Smoker  . Smokeless tobacco: Never Used  Substance and Sexual Activity  . Alcohol use: No  . Drug use: No  .  Sexual activity: Yes    Partners: Male    Birth control/protection: Surgical, Post-menopausal    Comment: BTL  Lifestyle  . Physical activity:    Days per week: Not on file    Minutes per session: Not on file  . Stress: Not on file  Relationships  . Social connections:    Talks on phone: Not on file    Gets together: Not on file    Attends religious service: Not on file    Active member of club or organization: Not on file    Attends meetings of clubs or organizations: Not on file    Relationship status: Not on file  Other Topics Concern  . Not on file  Social History Narrative   Walks 2X wkly   3 sons, 6 grandchildren    [place] from [place], [place]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    Family History  Problem Relation Age of Onset  . Hypertension Mother   . Stroke Mother 35       5 days post partum  . Alzheimer's disease Mother   . Hypertension Brother         2 brothers  . Cancer Brother 54       colorectal  . Hypertension Maternal Grandmother   . Thyroid disease Maternal Grandmother        hypothyroidism  . Diabetes Maternal Grandmother        diet controlled  . Dementia Maternal Grandmother 80  . Alzheimer's disease Maternal Grandmother   . Dementia Paternal Aunt   . Heart attack Neg Hx     Review of Systems  Constitutional: Negative for chills and fever.  Respiratory: Negative for cough, shortness of breath and wheezing.   Cardiovascular: Negative for chest pain, palpitations and leg swelling.  Neurological: Negative for light-headedness and headaches.       Objective:   Vitals:   02/17/18 0859  BP: 140/82  Pulse: 77  Resp: 16  Temp: 98.5 F (36.9 C)  SpO2: 97%   BP Readings from Last 3 Encounters:  02/17/18 140/82  10/24/17 140/86  08/14/17 130/86   Wt Readings from Last 3 Encounters:  02/17/18 235 lb 12.8 oz (107 kg)  10/24/17 236 lb 9.6 oz (107.3 kg)  08/14/17 234 lb (106.1 kg)   Body mass index is 36.93 kg/m.   Physical Exam    Constitutional: Appears well-developed and well-nourished. No distress.  HENT:  Head: Normocephalic and atraumatic.  Neck: Neck supple. No tracheal deviation present. No thyromegaly present.  No cervical lymphadenopathy Cardiovascular: Normal rate, regular rhythm and normal heart sounds.    2/6 systolic murmur heard. No carotid bruit .  No edema Pulmonary/Chest: Effort normal and breath sounds normal. No respiratory distress. No has no wheezes. No rales.  Skin: Skin is warm and dry. Not diaphoretic.  Psychiatric: Normal mood and affect. Behavior is normal.    Diabetic Foot Exam - Simple   Simple Foot Form Diabetic Foot exam was performed with the  following findings:  Yes 02/17/2018  9:38 AM  Visual Inspection No deformities, no ulcerations, no other skin breakdown bilaterally:  Yes Sensation Testing Intact to touch and monofilament testing bilaterally:  Yes Pulse Check Posterior Tibialis and Dorsalis pulse intact bilaterally:  Yes Comments      Assessment & Plan:    See Problem List for Assessment and Plan of chronic medical problems.

## 2018-02-17 ENCOUNTER — Encounter: Payer: Self-pay | Admitting: Internal Medicine

## 2018-02-17 ENCOUNTER — Telehealth: Payer: Self-pay

## 2018-02-17 ENCOUNTER — Other Ambulatory Visit (INDEPENDENT_AMBULATORY_CARE_PROVIDER_SITE_OTHER): Payer: BLUE CROSS/BLUE SHIELD

## 2018-02-17 ENCOUNTER — Ambulatory Visit (INDEPENDENT_AMBULATORY_CARE_PROVIDER_SITE_OTHER): Payer: BLUE CROSS/BLUE SHIELD | Admitting: Internal Medicine

## 2018-02-17 VITALS — BP 140/82 | HR 77 | Temp 98.5°F | Resp 16 | Ht 67.0 in | Wt 235.8 lb

## 2018-02-17 DIAGNOSIS — E119 Type 2 diabetes mellitus without complications: Secondary | ICD-10-CM

## 2018-02-17 DIAGNOSIS — E669 Obesity, unspecified: Secondary | ICD-10-CM

## 2018-02-17 DIAGNOSIS — E7849 Other hyperlipidemia: Secondary | ICD-10-CM

## 2018-02-17 DIAGNOSIS — I10 Essential (primary) hypertension: Secondary | ICD-10-CM

## 2018-02-17 LAB — MICROALBUMIN / CREATININE URINE RATIO
Creatinine,U: 179.6 mg/dL
MICROALB UR: 2.5 mg/dL — AB (ref 0.0–1.9)
MICROALB/CREAT RATIO: 1.4 mg/g (ref 0.0–30.0)

## 2018-02-17 LAB — LIPID PANEL
Cholesterol: 188 mg/dL (ref 0–200)
HDL: 48.6 mg/dL (ref >39.00)
LDL CALC: 122 mg/dL — AB (ref 0–99)
NonHDL: 139.5
Total CHOL/HDL Ratio: 4
Triglycerides: 88 mg/dL (ref 0.0–149.0)
VLDL: 17.6 mg/dL (ref 0.0–40.0)

## 2018-02-17 LAB — COMPREHENSIVE METABOLIC PANEL
ALK PHOS: 113 U/L (ref 39–117)
ALT: 20 U/L (ref 0–35)
AST: 18 U/L (ref 0–37)
Albumin: 4.8 g/dL (ref 3.5–5.2)
BUN: 21 mg/dL (ref 6–23)
CO2: 26 mEq/L (ref 19–32)
Calcium: 10.4 mg/dL (ref 8.4–10.5)
Chloride: 103 mEq/L (ref 96–112)
Creatinine, Ser: 1.19 mg/dL (ref 0.40–1.20)
GFR: 59.28 mL/min — AB (ref >60.00)
GLUCOSE: 114 mg/dL — AB (ref 70–99)
POTASSIUM: 4 meq/L (ref 3.5–5.1)
Sodium: 139 mEq/L (ref 135–145)
TOTAL PROTEIN: 8.4 g/dL — AB (ref 6.0–8.3)
Total Bilirubin: 0.8 mg/dL (ref 0.2–1.2)

## 2018-02-17 LAB — HEMOGLOBIN A1C: HEMOGLOBIN A1C: 6.6 % — AB (ref 4.6–6.5)

## 2018-02-17 MED ORDER — CRESTOR 20 MG PO TABS: 20.0000 mg | ORAL_TABLET | Freq: Every day | ORAL | 3 refills | Status: DC

## 2018-02-17 NOTE — Assessment & Plan Note (Signed)
Controlled for the most part, but should be a little lower Plans on starting to exercise, work on weight loss Continue current medications at current doses cmp

## 2018-02-17 NOTE — Telephone Encounter (Signed)
PA initiated for Brand name Crestor.   TDS:[id number]

## 2018-02-17 NOTE — Patient Instructions (Addendum)
Make an eye appointment - make sure your doctor knows you have diabetes.  Ave them send me a report.    Tests ordered today. Your results will be released to [facility] (or called to you) after review, usually within 72hours after test completion. If any changes need to be made, you will be notified at that same time.   Medications reviewed and updated.  Changes include :   none  Your prescription(s) have been submitted to your pharmacy. Please take as directed and contact our office if you believe you are having problem(s) with the medication(s).   Please followup in 6 months

## 2018-02-17 NOTE — Assessment & Plan Note (Signed)
Not currently taking crestor - will restart 20 mg dialy Check lipid panel, cmp Continue daily statin Regular exercise and healthy diet encouraged

## 2018-02-17 NOTE — Assessment & Plan Note (Addendum)
Check a1c, urine micro Low sugar / carb diet Stressed regular exercise, weight loss

## 2018-02-17 NOTE — Assessment & Plan Note (Signed)
Stressed weight loss Will start exercising - plans on joining the gym  Healthy eatng F/u in 6 months

## 2018-05-11 DIAGNOSIS — Z1231 Encounter for screening mammogram for malignant neoplasm of breast: Secondary | ICD-10-CM | POA: Diagnosis not present

## 2018-07-22 ENCOUNTER — Ambulatory Visit: Payer: BLUE CROSS/BLUE SHIELD | Admitting: Internal Medicine

## 2018-07-22 ENCOUNTER — Other Ambulatory Visit: Payer: Self-pay

## 2018-07-22 ENCOUNTER — Encounter: Payer: Self-pay | Admitting: Internal Medicine

## 2018-07-22 VITALS — BP 132/80 | HR 66 | Temp 98.6°F | Resp 18 | Ht 67.0 in | Wt 234.0 lb

## 2018-07-22 DIAGNOSIS — J069 Acute upper respiratory infection, unspecified: Secondary | ICD-10-CM | POA: Diagnosis not present

## 2018-07-22 DIAGNOSIS — R509 Fever, unspecified: Secondary | ICD-10-CM | POA: Diagnosis not present

## 2018-07-22 LAB — POCT INFLUENZA A/B
INFLUENZA A, POC: NEGATIVE
Influenza B, POC: NEGATIVE

## 2018-07-22 MED ORDER — PROMETHAZINE-DM 6.25-15 MG/5ML PO SYRP: 5.0000 mL | ORAL_SOLUTION | Freq: Four times a day (QID) | ORAL | 0 refills | Status: DC | PRN

## 2018-07-22 NOTE — Progress Notes (Signed)
Subjective:    Patient ID: Kathleen Lucero, female    DOB: July 09, 1955, 62 y.o.   MRN: [id number]  HPI She is here for an acute visit for cold symptoms.  Her symptoms started 4 days ago.  She got this from her husband.   She is experiencing subjective fever, chills, nasal congestion, sneezing, sore throat, dry cough, headaches and lightheadedness.  She denies any ear pain, postnasal drip, sinus pain, shortness of breath, wheezing and chest tightness.  Her cough is worse at night but typically okay during the day.  Today is her best day.  She has taken corcidan, zicam  Medications and allergies reviewed with patient and updated if appropriate.  Patient Active Problem List   Diagnosis Date Noted  . Bicuspid aortic valve 09/18/2016  . Mitral valve insufficiency 09/18/2016  . Type 2 diabetes mellitus without complication, without long-term current use of insulin ([place]) 09/18/2016  . Fibroids, intramural 03/28/2015  . Obesity (BMI 30-39.9) 11/20/2013  . History of colonic polyps 11/19/2013  . Nonspecific abnormal electrocardiogram (ECG) (EKG) 10/04/2011  . History of hyperthyroidism 11/02/2008  . GOITER, MULTINODULAR 09/13/2008  . [place] DISEASE, LUMBOSACRAL SPINE 08/30/2008  . Hyperlipidemia 11/05/2006  . Essential hypertension 11/05/2006    Current Outpatient Medications on File Prior to Visit  Medication Sig Dispense Refill  . CRESTOR 20 MG tablet Take 1 tablet (20 mg total) by mouth daily. 90 tablet 3  . diltiazem (CARDIZEM CD) 180 MG 24 hr capsule TAKE 1 CAPSULE (180 MG TOTAL) BY MOUTH DAILY. 90 capsule 1  . Multiple Vitamins-Minerals (HAIR SKIN & NAILS ADVANCED PO) Take by mouth daily.    Marland Kitchen spironolactone (ALDACTONE) 25 MG tablet TAKE 1 TABLET BY MOUTH TWICE A DAY 180 tablet 3   No current facility-administered medications on file prior to visit.     Past Medical History:  Diagnosis Date  . Diabetes mellitus ([place])    A1c 6.7 in 3/18  . GERD (gastroesophageal reflux  disease) 2007  . Goiter   . Heart murmur    "my whole life"  . Hyperlipidemia   . Hypertension   . Lumbar degenerative disc disease    S/P ruptured disc, Dr. Lindwood Qua  . T3 thyrotoxicosis 2011   S/P RAI  . Uterine fibroid    Dr Sabra Heck    Past Surgical History:  Procedure Laterality Date  . colonoscopy with polypectomy  2011   Dr Floria Raveling done 2013 as recommended  . TUBAL LIGATION  1981  . WISDOM TOOTH EXTRACTION      Social History   Socioeconomic History  . Marital status: Married    Spouse name: Not on file  . Number of children: 3  . Years of education: Not on file  . Highest education level: Not on file  Occupational History  . Occupation: Best boy: [employer]  . Financial resource strain: Not on file  . Food insecurity:    Worry: Not on file    Inability: Not on file  . Transportation needs:    Medical: Not on file    Non-medical: Not on file  Tobacco Use  . Smoking status: Never Smoker  . Smokeless tobacco: Never Used  Substance and Sexual Activity  . Alcohol use: No  . Drug use: No  . Sexual activity: Yes    Partners: Male    Birth control/protection: Surgical, Post-menopausal    Comment: BTL  Lifestyle  . Physical activity:    Days  per week: Not on file    Minutes per session: Not on file  . Stress: Not on file  Relationships  . Social connections:    Talks on phone: Not on file    Gets together: Not on file    Attends religious service: Not on file    Active member of club or organization: Not on file    Attends meetings of clubs or organizations: Not on file    Relationship status: Not on file  Other Topics Concern  . Not on file  Social History Narrative   Walks 2X wkly   3 sons, 6 grandchildren   [place] from [place], [place]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    Family History  Problem Relation Age of Onset  . Hypertension Mother   . Stroke Mother 35       5 days post partum  . Alzheimer's disease Mother   .  Hypertension Brother         2 brothers  . Cancer Brother 2       colorectal  . Hypertension Maternal Grandmother   . Thyroid disease Maternal Grandmother        hypothyroidism  . Diabetes Maternal Grandmother        diet controlled  . Dementia Maternal Grandmother 63  . Alzheimer's disease Maternal Grandmother   . Dementia Paternal Aunt   . Heart attack Neg Hx     Review of Systems  Constitutional: Positive for chills and fever (subjective).  HENT: Positive for congestion, sneezing and sore throat. Negative for ear pain, postnasal drip, sinus pressure and sinus pain.   Respiratory: Positive for cough (dry - worse at night, during day it is ok). Negative for chest tightness, shortness of breath and wheezing.   Cardiovascular: Negative for chest pain.  Neurological: Positive for light-headedness and headaches.       Objective:   Vitals:   07/22/18 1032  BP: 132/80  Pulse: 66  Resp: 18  Temp: 98.6 F (37 C)  SpO2: 97%   Filed Weights   07/22/18 1032  Weight: 234 lb (106.1 kg)   Body mass index is 36.65 kg/m.  Wt Readings from Last 3 Encounters:  07/22/18 234 lb (106.1 kg)  02/17/18 235 lb 12.8 oz (107 kg)  10/24/17 236 lb 9.6 oz (107.3 kg)     Physical Exam GENERAL APPEARANCE: Appears stated age, well appearing, NAD EYES: conjunctiva clear, no icterus HEENT: bilateral tympanic membranes and ear canals normal, oropharynx with mild erythema, no thyromegaly, trachea midline, no cervical or supraclavicular lymphadenopathy LUNGS: Clear to auscultation without wheeze or crackles, unlabored breathing, good air entry bilaterally CARDIOVASCULAR: Normal S1,S2 without murmurs, no edema SKIN: warm, dry        Assessment & Plan:   See Problem List for Assessment and Plan of chronic medical problems.

## 2018-07-22 NOTE — Patient Instructions (Signed)
Your cough syrup has been submitted to your pharmacy. Please take as directed and contact our office if you believe you are having problem(s) with the medication(s) or have any questions.  If your symptoms worsen or fail to improve, please contact our office for further instruction, or in case of emergency go directly to the emergency room at the closest medical facility.   General Recommendations:    Please drink plenty of fluids.  Get plenty of rest   Sleep in humidified air  Use saline nasal sprays  Netti pot  OTC Medications:  Decongestants - helps relieve congestion   Flonase (generic fluticasone) or Nasacort (generic triamcinolone) - please make sure to use the "cross-over" technique at a 45 degree angle towards the opposite eye as opposed to straight up the nasal passageway.   Sudafed (generic pseudoephedrine - Note this is the one that is available behind the pharmacy counter); Products with phenylephrine (-PE) may also be used but is often not as effective as pseudoephedrine.   If you have HIGH BLOOD PRESSURE - Coricidin HBP; AVOID any product that is -D as this contains pseudoephedrine which may increase your blood pressure.  Afrin (oxymetazoline) every 6-8 hours for up to 3 days.  Allergies - helps relieve runny nose, itchy eyes and sneezing   Claritin (generic loratidine), Allegra (fexofenidine), or Zyrtec (generic cyrterizine) for runny nose. These medications should not cause drowsiness.  Note - Benadryl (generic diphenhydramine) may be used however may cause drowsiness  Cough -   Delsym or Robitussin (generic dextromethorphan)  Expectorants - helps loosen mucus to ease removal   Mucinex (generic guaifenesin) as directed on the package.  Headaches / General Aches   Tylenol (generic acetaminophen) - DO NOT EXCEED 3 grams (3,000 mg) in a 24 hour time period  Advil/Motrin (generic ibuprofen)  Sore Throat -   Salt water gargle    Chloraseptic (generic benzocaine) spray or lozenges / Sucrets (generic dyclonine)

## 2018-07-22 NOTE — Assessment & Plan Note (Signed)
Symptoms likely viral in nature Flu test is negative Continue symptomatic treatment with over-the-counter cold medications, Tylenol/ibuprofen Promethazine-dextromethorphan Increase rest and fluids Call if symptoms worsen or do not improve

## 2018-07-23 ENCOUNTER — Other Ambulatory Visit: Payer: Self-pay | Admitting: Internal Medicine

## 2018-08-19 ENCOUNTER — Ambulatory Visit: Payer: BLUE CROSS/BLUE SHIELD | Admitting: Internal Medicine

## 2018-09-08 ENCOUNTER — Other Ambulatory Visit: Payer: Self-pay | Admitting: Internal Medicine

## 2018-09-08 NOTE — Telephone Encounter (Signed)
LVM for pt to call back to talk about moving up follow up and making it virtual.

## 2018-09-08 NOTE — Telephone Encounter (Signed)
Patient would not move appt up due to work, converted appt into virtual.

## 2018-10-08 NOTE — Progress Notes (Signed)
Virtual Visit via Video Note  I connected with Kathleen Lucero on 10/09/18 at  8:15 AM EDT by a video enabled telemedicine application and verified that I am speaking with the correct person using two identifiers.   I discussed the limitations of evaluation and management by telemedicine and the availability of in person appointments. The patient expressed understanding and agreed to proceed.  The patient is currently at home and I am in the office.    No referring provider.    History of Present Illness: She is here for follow up of her chronic medical conditions.   She is exercising regularly - getting on treadmill almost daily.  She plans on trying to increase this.  She feels the best she has felt in a long time.  She is eating better because she is eating at home.  She is less stressed because she is working from home.  She is unsure if she has lost weight or not.  Hypertension: She is taking her medication daily. She is compliant with a low sodium diet.  She denies chest pain, palpitations, edema, shortness of breath and regular headaches.     Diabetes: She is taking her medication daily as prescribed. She is compliant with a diabetic diet.   She denies numbness/tingling in her feet and foot lesions. She is not up-to-date with an ophthalmology examination.   Hyperlipidemia: She is taking her medication daily. She is compliant with a low fat/cholesterol diet. She denies myalgias.   bicuspid aortic valve, MR:  She is overdue for cardiology follow up and and Echo.  She denies any shortness of breath, palpitations, chest pain or lightheadedness.   Review of Systems  Constitutional: Negative for chills and fever.  Respiratory: Negative for cough, shortness of breath and wheezing.   Cardiovascular: Negative for chest pain, palpitations and leg swelling.  Musculoskeletal: Negative for myalgias.  Neurological: Negative for tingling and headaches.     Social History    Socioeconomic History  . Marital status: Married    Spouse name: Not on file  . Number of children: 3  . Years of education: Not on file  . Highest education level: Not on file  Occupational History  . Occupation: Best boy: [employer]  . Financial resource strain: Not on file  . Food insecurity:    Worry: Not on file    Inability: Not on file  . Transportation needs:    Medical: Not on file    Non-medical: Not on file  Tobacco Use  . Smoking status: Never Smoker  . Smokeless tobacco: Never Used  Substance and Sexual Activity  . Alcohol use: No  . Drug use: No  . Sexual activity: Yes    Partners: Male    Birth control/protection: Surgical, Post-menopausal    Comment: BTL  Lifestyle  . Physical activity:    Days per week: Not on file    Minutes per session: Not on file  . Stress: Not on file  Relationships  . Social connections:    Talks on phone: Not on file    Gets together: Not on file    Attends religious service: Not on file    Active member of club or organization: Not on file    Attends meetings of clubs or organizations: Not on file    Relationship status: Not on file  Other Topics Concern  . Not on file  Social History Narrative   Walks 2X [employer]  3 sons, 6 grandchildren   37 from [place], [place]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     Observations/Objective: Appears well in NAD Normal mood and affect  Assessment and Plan:  See Problem List for Assessment and Plan of chronic medical problems.   Follow Up Instructions:    I discussed the assessment and treatment plan with the patient. The patient was provided an opportunity to ask questions and all were answered. The patient agreed with the plan and demonstrated an understanding of the instructions.   The patient was advised to call back or seek an in-person evaluation if the symptoms worsen or if the condition fails to improve as anticipated.  6 months in office if possible with  blood work at that time.  Binnie Rail, MD

## 2018-10-09 ENCOUNTER — Encounter: Payer: Self-pay | Admitting: Internal Medicine

## 2018-10-09 ENCOUNTER — Ambulatory Visit: Payer: BLUE CROSS/BLUE SHIELD | Admitting: Internal Medicine

## 2018-10-09 ENCOUNTER — Ambulatory Visit (INDEPENDENT_AMBULATORY_CARE_PROVIDER_SITE_OTHER): Payer: BLUE CROSS/BLUE SHIELD | Admitting: Internal Medicine

## 2018-10-09 DIAGNOSIS — E119 Type 2 diabetes mellitus without complications: Secondary | ICD-10-CM

## 2018-10-09 DIAGNOSIS — I340 Nonrheumatic mitral (valve) insufficiency: Secondary | ICD-10-CM | POA: Diagnosis not present

## 2018-10-09 DIAGNOSIS — Q231 Congenital insufficiency of aortic valve: Secondary | ICD-10-CM | POA: Diagnosis not present

## 2018-10-09 DIAGNOSIS — E669 Obesity, unspecified: Secondary | ICD-10-CM

## 2018-10-09 DIAGNOSIS — I10 Essential (primary) hypertension: Secondary | ICD-10-CM | POA: Diagnosis not present

## 2018-10-09 DIAGNOSIS — E7849 Other hyperlipidemia: Secondary | ICD-10-CM

## 2018-10-09 NOTE — Assessment & Plan Note (Signed)
Moderate in nature per 2018 echo Overdue for follow-up-echocardiogram ordered We will discuss cardiology follow-up after echo is complete Currently asymptomatic-discussed symptoms to monitor for and call if she has any

## 2018-10-09 NOTE — Assessment & Plan Note (Signed)
Since working from home she has been exercising regularly and plans on increasing her exercise She is also been eating more healthy-no longer going for fast food in the morning She has not been weighing herself and unsure as she has lost weight Encouraged her to continue these healthy habits and at some point she can start to concentrate on weight loss

## 2018-10-09 NOTE — Assessment & Plan Note (Signed)
BP Readings from Last 3 Encounters:  07/22/18 132/80  02/17/18 140/82  10/24/17 140/86   She has been exercising more, eating more healthy and hopefully has lost weight, but she is not sure We will continue current medications at current doses and follow-up in 6 months

## 2018-10-09 NOTE — Assessment & Plan Note (Signed)
Continue Crestor 

## 2018-10-09 NOTE — Assessment & Plan Note (Signed)
Did see cardiology in 2018, but did not see him last year She is overdue for an echocardiogram-she is reluctant to get this during the coronavirus situation, but will schedule it for early fall Currently asymptomatic Will likely need to see cardiology, but will see what her follow-up echocardiogram shows and she may be able to do a virtual visit

## 2018-10-09 NOTE — Assessment & Plan Note (Signed)
Lab Results  Component Value Date   HGBA1C 6.6 (H) 02/17/2018   Sugars have been well controlled She has been working from home and has been exercising and eating more healthy most likely her sugars are well controlled because of that She would prefer not to come in for blood work at this time and due to the coronavirus situation I think that is reasonable Will have her follow-up with me in 6 months and do blood work at that time Continue regular exercise and healthy diet Continue to work on weight loss

## 2018-11-05 ENCOUNTER — Encounter (HOSPITAL_COMMUNITY): Payer: Self-pay | Admitting: Internal Medicine

## 2018-11-24 ENCOUNTER — Encounter: Payer: Self-pay | Admitting: Internal Medicine

## 2019-01-15 ENCOUNTER — Other Ambulatory Visit: Payer: Self-pay | Admitting: Internal Medicine

## 2019-01-21 ENCOUNTER — Telehealth (HOSPITAL_COMMUNITY): Payer: Self-pay

## 2019-01-21 NOTE — Telephone Encounter (Signed)
noted 

## 2019-01-21 NOTE — Telephone Encounter (Signed)
New message    Just an FYI. We have made several attempts to contact this patient including sending a letter to schedule or reschedule their echocardiogram. We will be removing the patient from the echo WQ.   Thank you  9.10.20 @ 12:32pm lm on home vm Lailynn Southgate  8.25.20 @ 3:31pm lm on home vm Mancil Pfenning  7/14 mailed ltr to pt/lb 6.25.20 mail reminder letter Jari Sportsman

## 2019-02-05 ENCOUNTER — Ambulatory Visit: Payer: BLUE CROSS/BLUE SHIELD | Admitting: Obstetrics & Gynecology

## 2019-02-05 ENCOUNTER — Encounter

## 2019-02-26 ENCOUNTER — Other Ambulatory Visit: Payer: Self-pay | Admitting: Internal Medicine

## 2019-04-30 ENCOUNTER — Other Ambulatory Visit: Payer: Self-pay | Admitting: Internal Medicine

## 2019-05-30 ENCOUNTER — Other Ambulatory Visit: Payer: Self-pay | Admitting: Internal Medicine

## 2019-07-20 NOTE — Patient Instructions (Addendum)
Blood work was ordered.    All other Health Maintenance issues reviewed.   All recommended immunizations and age-appropriate screenings are up-to-date or discussed.  No immunization administered today.   Medications reviewed and updated.  Changes include :  Start Wellbutrin 150 mg daily in the morning for depression   Your prescription(s) have been submitted to your pharmacy. Please take as directed and contact our office if you believe you are having problem(s) with the medication(s).    Please followup in 6 months    Health Maintenance, Female Adopting a healthy lifestyle and getting preventive care are important in promoting health and wellness. Ask your health care provider about:  The right schedule for you to have regular tests and exams.  Things you can do on your own to prevent diseases and keep yourself healthy. What should I know about diet, weight, and exercise? Eat a healthy diet   Eat a diet that includes plenty of vegetables, fruits, low-fat dairy products, and lean protein.  Do not eat a lot of foods that are high in solid fats, added sugars, or sodium. Maintain a healthy weight Body mass index (BMI) is used to identify weight problems. It estimates body fat based on height and weight. Your health care provider can help determine your BMI and help you achieve or maintain a healthy weight. Get regular exercise Get regular exercise. This is one of the most important things you can do for your health. Most adults should:  Exercise for at least 150 minutes each week. The exercise should increase your heart rate and make you sweat (moderate-intensity exercise).  Do strengthening exercises at least twice a week. This is in addition to the moderate-intensity exercise.  Spend less time sitting. Even light physical activity can be beneficial. Watch cholesterol and blood lipids Have your blood tested for lipids and cholesterol at 63 years of age, then have this test  every 5 years. Have your cholesterol levels checked more often if:  Your lipid or cholesterol levels are high.  You are older than 63 years of age.  You are at high risk for heart disease. What should I know about cancer screening? Depending on your health history and family history, you may need to have cancer screening at various ages. This may include screening for:  Breast cancer.  Cervical cancer.  Colorectal cancer.  Skin cancer.  Lung cancer. What should I know about heart disease, diabetes, and high blood pressure? Blood pressure and heart disease  High blood pressure causes heart disease and increases the risk of stroke. This is more likely to develop in people who have high blood pressure readings, are of African descent, or are overweight.  Have your blood pressure checked: ? Every 3-5 years if you are 23-2 years of age. ? Every year if you are 78 years old or older. Diabetes Have regular diabetes screenings. This checks your fasting blood sugar level. Have the screening done:  Once every three years after age 57 if you are at a normal weight and have a low risk for diabetes.  More often and at a younger age if you are overweight or have a high risk for diabetes. What should I know about preventing infection? Hepatitis B If you have a higher risk for hepatitis B, you should be screened for this virus. Talk with your health care provider to find out if you are at risk for hepatitis B infection. Hepatitis C Testing is recommended for:  Everyone born from [birth year] through  [place] with known risk factors for hepatitis C. Sexually transmitted infections (STIs)  Get screened for STIs, including gonorrhea and chlamydia, if: ? You are sexually active and are younger than 63 years of age. ? You are older than 63 years of age and your health care provider tells you that you are at risk for this type of infection. ? Your sexual activity has changed since you were  last screened, and you are at increased risk for chlamydia or gonorrhea. Ask your health care provider if you are at risk.  Ask your health care provider about whether you are at high risk for HIV. Your health care provider may recommend a prescription medicine to help prevent HIV infection. If you choose to take medicine to prevent HIV, you should first get tested for HIV. You should then be tested every 3 months for as long as you are taking the medicine. Pregnancy  If you are about to stop having your period (premenopausal) and you may become pregnant, seek counseling before you get pregnant.  Take 400 to 800 micrograms (mcg) of folic acid every day if you become pregnant.  Ask for birth control (contraception) if you want to prevent pregnancy. Osteoporosis and menopause Osteoporosis is a disease in which the bones lose minerals and strength with aging. This can result in bone fractures. If you are 59 years old or older, or if you are at risk for osteoporosis and fractures, ask your health care provider if you should:  Be screened for bone loss.  Take a calcium or vitamin D supplement to lower your risk of fractures.  Be given hormone replacement therapy (HRT) to treat symptoms of menopause. Follow these instructions at home: Lifestyle  Do not use any products that contain nicotine or tobacco, such as cigarettes, e-cigarettes, and chewing tobacco. If you need help quitting, ask your health care provider.  Do not use street drugs.  Do not share needles.  Ask your health care provider for help if you need support or information about quitting drugs. Alcohol use  Do not drink alcohol if: ? Your health care provider tells you not to drink. ? You are pregnant, may be pregnant, or are planning to become pregnant.  If you drink alcohol: ? Limit how much you use to 0-1 drink a day. ? Limit intake if you are breastfeeding.  Be aware of how much alcohol is in your drink. In the [place]  one drink equals one 12 oz bottle of beer (355 mL), one 5 oz glass of wine (148 mL), or one 1 oz glass of hard liquor (44 mL). General instructions  Schedule regular health, dental, and eye exams.  Stay current with your vaccines.  Tell your health care provider if: ? You often feel depressed. ? You have ever been abused or do not feel safe at home. Summary  Adopting a healthy lifestyle and getting preventive care are important in promoting health and wellness.  Follow your health care provider's instructions about healthy diet, exercising, and getting tested or screened for diseases.  Follow your health care provider's instructions on monitoring your cholesterol and blood pressure. This information is not intended to replace advice given to you by your health care provider. Make sure you discuss any questions you have with your health care provider. Document Revised: 04/22/2018 Document Reviewed: 04/22/2018 Elsevier Patient Education  2020 [employer].

## 2019-07-20 NOTE — Progress Notes (Signed)
Subjective:    Patient ID: Kathleen Lucero, female    DOB: 08-26-1955, 64 y.o.   MRN: [id number]  HPI She is here for a physical exam.   She thinks she has been depression - she has not been able to see her family.  She has two new grandbabies and has not been able to see them.  She knows ten people that have died from covid.  She has been working from home and besides going to the grocery store has not been out of the house or done anything socially.  She has not been exercising regularly.  She has been eating too much and has gained weight.    Medications and allergies reviewed with patient and updated if appropriate.  Patient Active Problem List   Diagnosis Date Noted  . Chronic constipation 07/21/2019  . Bicuspid aortic valve 09/18/2016  . Mitral valve insufficiency, moderate 09/18/2016  . Type 2 diabetes mellitus without complication, without long-term current use of insulin ([place]) 09/18/2016  . Fibroids, intramural 03/28/2015  . Obesity (BMI 30-39.9) 11/20/2013  . History of colonic polyps 11/19/2013  . Nonspecific abnormal electrocardiogram (ECG) (EKG) 10/04/2011  . History of hyperthyroidism 11/02/2008  . GOITER, MULTINODULAR 09/13/2008  . [place] DISEASE, LUMBOSACRAL SPINE 08/30/2008  . Hyperlipidemia 11/05/2006  . Essential hypertension 11/05/2006    Current Outpatient Medications on File Prior to Visit  Medication Sig Dispense Refill  . Cholecalciferol (VITAMIN D) 125 MCG (5000 UT) CAPS     . Zinc 50 MG TABS      No current facility-administered medications on file prior to visit.    Past Medical History:  Diagnosis Date  . Diabetes mellitus ([place])    A1c 6.7 in 3/18  . GERD (gastroesophageal reflux disease) 2007  . Goiter   . Heart murmur    "my whole life"  . Hyperlipidemia   . Hypertension   . Lumbar degenerative disc disease    S/P ruptured disc, Dr. Lindwood Qua  . T3 thyrotoxicosis 2011   S/P RAI  . Uterine fibroid    Dr Sabra Heck    Past  Surgical History:  Procedure Laterality Date  . colonoscopy with polypectomy  2011   Dr Floria Raveling done 2013 as recommended  . TUBAL LIGATION  1981  . WISDOM TOOTH EXTRACTION      Social History   Socioeconomic History  . Marital status: Married    Spouse name: Not on file  . Number of children: 3  . Years of education: Not on file  . Highest education level: Not on file  Occupational History  . Occupation: Best boy: [employer]  Tobacco Use  . Smoking status: Never Smoker  . Smokeless tobacco: Never Used  Substance and Sexual Activity  . Alcohol use: No  . Drug use: No  . Sexual activity: Yes    Partners: Male    Birth control/protection: Surgical, Post-menopausal    Comment: BTL  Other Topics Concern  . Not on file  Social History Narrative   Walks 2X wkly   3 sons, 6 grandchildren   [place] from [place], [place]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   Social Determinants of Radio broadcast assistant Strain:   . Difficulty of Paying Living Expenses: Not on file  Food Insecurity:   . Worried About Charity fundraiser in the Last Year: Not on file  . Ran Out of Food in the Last Year: Not on file  Transportation Needs:   . Lack  of Transportation (Medical): Not on file  . Lack of Transportation (Non-Medical): Not on file  Physical Activity:   . Days of Exercise per Week: Not on file  . Minutes of Exercise per Session: Not on file  Stress:   . Feeling of Stress : Not on file  Social Connections:   . Frequency of Communication with Friends and Family: Not on file  . Frequency of Social Gatherings with Friends and Family: Not on file  . Attends Religious Services: Not on file  . Active Member of Clubs or Organizations: Not on file  . Attends Archivist Meetings: Not on file  . Marital Status: Not on file    Family History  Problem Relation Age of Onset  . Hypertension Mother   . Stroke Mother 35       5 days post partum  . Alzheimer's disease Mother   .  Hypertension Brother         2 brothers  . Cancer Brother 33       colorectal  . Hypertension Maternal Grandmother   . Thyroid disease Maternal Grandmother        hypothyroidism  . Diabetes Maternal Grandmother        diet controlled  . Dementia Maternal Grandmother 60  . Alzheimer's disease Maternal Grandmother   . Dementia Paternal Aunt   . Heart attack Neg Hx     Review of Systems  Constitutional: Negative for chills and fever.  Eyes: Negative for visual disturbance.  Respiratory: Negative for cough, shortness of breath and wheezing.   Cardiovascular: Positive for leg swelling (mild). Negative for chest pain and palpitations.  Gastrointestinal: Positive for constipation. Negative for abdominal pain, blood in stool, diarrhea and nausea.       No gerd  Genitourinary: Negative for dysuria and hematuria.  Musculoskeletal: Negative for arthralgias and back pain.  Skin: Negative for color change and rash.  Neurological: Negative for light-headedness and headaches.  Psychiatric/Behavioral: Positive for dysphoric mood. The patient is not nervous/anxious.        Objective:   Vitals:   07/21/19 1303  BP: (!) 164/98  Pulse: 78  Resp: 16  Temp: 98.5 F (36.9 C)  SpO2: 96%   Filed Weights   07/21/19 1303  Weight: 252 lb (114.3 kg)   Body mass index is 39.47 kg/m.  BP Readings from Last 3 Encounters:  07/21/19 (!) 164/98  07/22/18 132/80  02/17/18 140/82    Wt Readings from Last 3 Encounters:  07/21/19 252 lb (114.3 kg)  07/22/18 234 lb (106.1 kg)  02/17/18 235 lb 12.8 oz (107 kg)     Physical Exam Constitutional: She appears well-developed and well-nourished. No distress.  HENT:  Head: Normocephalic and atraumatic.  Right Ear: External ear normal. Normal ear canal and TM Left Ear: External ear normal.  Normal ear canal and TM Mouth/Throat: Oropharynx is clear and moist.  Eyes: Conjunctivae and EOM are normal.  Neck: Neck supple. No tracheal deviation present.  No thyromegaly present.  No carotid bruit  Cardiovascular: Normal rate, regular rhythm and normal heart sounds.   2/6 systolic murmur heard.  No edema. Pulmonary/Chest: Effort normal and breath sounds normal. No respiratory distress. She has no wheezes. She has no rales.  Breast: deferred   Abdominal: Soft. She exhibits no distension. There is no tenderness.  Lymphadenopathy: She has no cervical adenopathy.  Skin: Skin is warm and dry. She is not diaphoretic.  Psychiatric: She has a normal mood and affect.  Her behavior is normal.        Assessment & Plan:   Physical exam: Screening blood work    ordered Immunizations  deferred Colonoscopy   Up to date  Mammogram  scheduled Gyn  scheduled Eye exams  Due - will schedule Exercise  None - stressed weight loss Weight  Has gained weight. Discussed weight loss at length Substance abuse   none  See Problem List for Assessment and Plan of chronic medical problems.     This visit occurred during the SARS-CoV-2 public health emergency.  Safety protocols were in place, including screening questions prior to the visit, additional usage of staff PPE, and extensive cleaning of exam room while observing appropriate contact time as indicated for disinfecting solutions.

## 2019-07-21 ENCOUNTER — Encounter: Payer: Self-pay | Admitting: Internal Medicine

## 2019-07-21 ENCOUNTER — Other Ambulatory Visit: Payer: Self-pay

## 2019-07-21 ENCOUNTER — Ambulatory Visit: Payer: 59 | Admitting: Internal Medicine

## 2019-07-21 VITALS — BP 164/98 | HR 78 | Temp 98.5°F | Resp 16 | Ht 67.0 in | Wt 252.0 lb

## 2019-07-21 DIAGNOSIS — E669 Obesity, unspecified: Secondary | ICD-10-CM

## 2019-07-21 DIAGNOSIS — E119 Type 2 diabetes mellitus without complications: Secondary | ICD-10-CM | POA: Diagnosis not present

## 2019-07-21 DIAGNOSIS — I340 Nonrheumatic mitral (valve) insufficiency: Secondary | ICD-10-CM

## 2019-07-21 DIAGNOSIS — I10 Essential (primary) hypertension: Secondary | ICD-10-CM

## 2019-07-21 DIAGNOSIS — Q2381 Bicuspid aortic valve: Secondary | ICD-10-CM

## 2019-07-21 DIAGNOSIS — K5909 Other constipation: Secondary | ICD-10-CM | POA: Insufficient documentation

## 2019-07-21 DIAGNOSIS — E7849 Other hyperlipidemia: Secondary | ICD-10-CM

## 2019-07-21 DIAGNOSIS — Z0000 Encounter for general adult medical examination without abnormal findings: Secondary | ICD-10-CM | POA: Diagnosis not present

## 2019-07-21 DIAGNOSIS — Z8639 Personal history of other endocrine, nutritional and metabolic disease: Secondary | ICD-10-CM

## 2019-07-21 DIAGNOSIS — Q231 Congenital insufficiency of aortic valve: Secondary | ICD-10-CM

## 2019-07-21 LAB — LIPID PANEL
Cholesterol: 186 mg/dL (ref 0–200)
HDL: 45.5 mg/dL (ref >39.00)
LDL Cholesterol: 111 mg/dL — ABNORMAL HIGH (ref 0–99)
NonHDL: 140.23
Total CHOL/HDL Ratio: 4
Triglycerides: 147 mg/dL (ref 0.0–149.0)
VLDL: 29.4 mg/dL (ref 0.0–40.0)

## 2019-07-21 LAB — COMPREHENSIVE METABOLIC PANEL
ALT: 27 U/L (ref 0–35)
AST: 21 U/L (ref 0–37)
Albumin: 4.6 g/dL (ref 3.5–5.2)
Alkaline Phosphatase: 100 U/L (ref 39–117)
BUN: 15 mg/dL (ref 6–23)
CO2: 26 mEq/L (ref 19–32)
Calcium: 10.1 mg/dL (ref 8.4–10.5)
Chloride: 101 mEq/L (ref 96–112)
Creatinine, Ser: 1.07 mg/dL (ref 0.40–1.20)
GFR: 62.76 mL/min (ref >60.00)
Glucose, Bld: 106 mg/dL — ABNORMAL HIGH (ref 70–99)
Potassium: 3.7 mEq/L (ref 3.5–5.1)
Sodium: 137 mEq/L (ref 135–145)
Total Bilirubin: 0.7 mg/dL (ref 0.2–1.2)
Total Protein: 8.1 g/dL (ref 6.0–8.3)

## 2019-07-21 LAB — CBC WITH DIFFERENTIAL/PLATELET
Basophils Absolute: 0.1 10*3/uL (ref 0.0–0.1)
Basophils Relative: 0.9 % (ref 0.0–3.0)
Eosinophils Absolute: 0.3 10*3/uL (ref 0.0–0.7)
Eosinophils Relative: 4.7 % (ref 0.0–5.0)
HCT: 44.2 % (ref 36.0–46.0)
Hemoglobin: 14.4 g/dL (ref 12.0–15.0)
Lymphocytes Relative: 32.3 % (ref 12.0–46.0)
Lymphs Abs: 2.3 10*3/uL (ref 0.7–4.0)
MCHC: 32.6 g/dL (ref 30.0–36.0)
MCV: 85 fl (ref 78.0–100.0)
Monocytes Absolute: 0.6 10*3/uL (ref 0.1–1.0)
Monocytes Relative: 7.7 % (ref 3.0–12.0)
Neutro Abs: 3.9 10*3/uL (ref 1.4–7.7)
Neutrophils Relative %: 54.4 % (ref 43.0–77.0)
Platelets: 215 10*3/uL (ref 150.0–400.0)
RBC: 5.2 Mil/uL — ABNORMAL HIGH (ref 3.87–5.11)
RDW: 13.9 % (ref 11.5–15.5)
WBC: 7.2 10*3/uL (ref 4.0–10.5)

## 2019-07-21 LAB — MICROALBUMIN / CREATININE URINE RATIO
Creatinine,U: 77.8 mg/dL
Microalb Creat Ratio: 1.7 mg/g (ref 0.0–30.0)
Microalb, Ur: 1.3 mg/dL (ref 0.0–1.9)

## 2019-07-21 LAB — HEMOGLOBIN A1C: Hgb A1c MFr Bld: 7.3 % — ABNORMAL HIGH (ref 4.6–6.5)

## 2019-07-21 LAB — TSH: TSH: 2.33 u[IU]/mL (ref 0.35–4.50)

## 2019-07-21 MED ORDER — BUPROPION HCL ER (XL) 150 MG PO TB24: 150.0000 mg | ORAL_TABLET | Freq: Every day | ORAL | 5 refills | Status: DC

## 2019-07-21 MED ORDER — DILTIAZEM HCL ER COATED BEADS 180 MG PO CP24: ORAL_CAPSULE | ORAL | 1 refills | Status: DC

## 2019-07-21 MED ORDER — ROSUVASTATIN CALCIUM 20 MG PO TABS: 20.0000 mg | ORAL_TABLET | Freq: Every day | ORAL | 1 refills | Status: DC

## 2019-07-21 MED ORDER — SPIRONOLACTONE 25 MG PO TABS: ORAL_TABLET | ORAL | 1 refills | Status: DC

## 2019-07-21 NOTE — Assessment & Plan Note (Signed)
She is overdue to see cardiology and have an echocardiogram She promises that she will get this scheduled in the next few months No concerning symptoms

## 2019-07-21 NOTE — Assessment & Plan Note (Addendum)
Chronic Controlled with eating the right fruits/veges She is trying to get into a better habit be more consistent with her healthy eating

## 2019-07-21 NOTE — Assessment & Plan Note (Signed)
Chronic Overdue for echocardiogram and seen cardiology She states she will get this scheduled in the next few months Not experiencing any concerning symptoms Discussed the importance of healthier lifestyle

## 2019-07-21 NOTE — Assessment & Plan Note (Signed)
Chronic With hypertension, diabetes and hyperlipidemia Stressed the importance of getting back into regular exercise-will try to start walking or doing something inside her house Discussed low sugar/carbohydrate diet, smaller portions, healthier eating Follow-up in 6 months

## 2019-07-21 NOTE — Assessment & Plan Note (Signed)
Chronic Check lipid panel  Continue daily statin Regular exercise and healthy diet encouraged  

## 2019-07-21 NOTE — Assessment & Plan Note (Signed)
History of hyperthyroidism, multinodular goiter Check TSH

## 2019-07-21 NOTE — Assessment & Plan Note (Signed)
Chronic Blood pressure high here today, but she was very nervous to come here She will buy a blood pressure cuff and start monitoring her blood pressure at home We will continue current medication for now, but advised her to let me know if her blood pressure is elevated at home Stressed getting back into regular exercise, healthier eating and weight loss CMP

## 2019-07-21 NOTE — Assessment & Plan Note (Signed)
Chronic She has not been as compliant with a diabetic diet, has not been exercising and has gained weight Check A1c Discussed that she may need to start medication Will start exercising and will work on weight loss Follow-up in 6 months

## 2019-07-22 ENCOUNTER — Other Ambulatory Visit: Payer: Self-pay

## 2019-07-22 ENCOUNTER — Encounter: Payer: Self-pay | Admitting: Internal Medicine

## 2019-07-22 DIAGNOSIS — E119 Type 2 diabetes mellitus without complications: Secondary | ICD-10-CM

## 2019-07-23 ENCOUNTER — Encounter: Payer: Self-pay | Admitting: Obstetrics & Gynecology

## 2019-07-23 ENCOUNTER — Ambulatory Visit: Payer: 59 | Admitting: Obstetrics & Gynecology

## 2019-07-23 ENCOUNTER — Other Ambulatory Visit (HOSPITAL_COMMUNITY)
Admission: RE | Admit: 2019-07-23 | Discharge: 2019-07-23 | Disposition: A | Discharge status: Home / Self Care | Payer: 59 | Source: Ambulatory Visit | Attending: Obstetrics & Gynecology | Admitting: Obstetrics & Gynecology

## 2019-07-23 VITALS — BP 128/70 | HR 84 | Temp 95.6°F | Resp 12 | Ht 67.0 in | Wt 248.0 lb

## 2019-07-23 DIAGNOSIS — Z01419 Encounter for gynecological examination (general) (routine) without abnormal findings: Secondary | ICD-10-CM

## 2019-07-23 DIAGNOSIS — Z124 Encounter for screening for malignant neoplasm of cervix: Secondary | ICD-10-CM | POA: Diagnosis not present

## 2019-07-23 NOTE — Patient Instructions (Signed)
To register for an appointment by phone, please call [phone], Option 2.  [email or web address]  [email or web address]

## 2019-07-23 NOTE — Progress Notes (Signed)
64 y.o. [id number] Married Kathleen Lucero American female here for annual exam.  No vaginal bleeding.  Does want Covid vaccination.  Has been very careful.  She's had 10 friends and family members who've died from Fabrica.  Had blood work on 07/21/2019.  HbA1C was 7.3.    Brother passed of colon cancer 12/2017.  Patient's last menstrual period was 05/14/2007.          Sexually active: Yes.    The current method of family planning is post menopausal status.    Exercising: Yes.    walking Smoker:  no  Health Maintenance: Pap:  07/19/16 Neg:Neg HR HPV  02/14/14 Neg History of abnormal Pap:  no MMG:  05/11/18 BIRADS 1 negative/density a Colonoscopy:  09/30/16 polyps. F/u 5 years BMD:   2008 TDaP: not UTD -- patient declines Pneumonia vaccine(s):  Patient declines Shingrix:   Patient declines Hep C testing: No Screening Labs: PCP   reports that she has never smoked. She has never used smokeless tobacco. She reports that she does not drink alcohol or use drugs.  Past Medical History:  Diagnosis Date  . Diabetes mellitus ([place])    A1c 6.7 in 3/18  . GERD (gastroesophageal reflux disease) 2007  . Goiter   . Heart murmur    "my whole life"  . Hyperlipidemia   . Hypertension   . Lumbar degenerative disc disease    S/P ruptured disc, Dr. Lindwood Qua  . T3 thyrotoxicosis 2011   S/P RAI  . Uterine fibroid    Dr Sabra Heck    Past Surgical History:  Procedure Laterality Date  . colonoscopy with polypectomy  2011   Dr Floria Raveling done 2013 as recommended  . TUBAL LIGATION  1981  . WISDOM TOOTH EXTRACTION      Current Outpatient Medications  Medication Sig Dispense Refill  . buPROPion (WELLBUTRIN XL) 150 MG 24 hr tablet Take 1 tablet (150 mg total) by mouth daily. 30 tablet 5  . Cholecalciferol (VITAMIN D) 125 MCG (5000 UT) CAPS     . diltiazem (CARDIZEM CD) 180 MG 24 hr capsule TAKE 1 CAPSULE BY MOUTH DAILY. 90 capsule 1  . rosuvastatin (CRESTOR) 20 MG tablet Take 1 tablet (20 mg total) by  mouth daily. 90 tablet 1  . spironolactone (ALDACTONE) 25 MG tablet TAKE 1 TABLET BY MOUTH 2 TIMES DAILY. 180 tablet 1  . Zinc 50 MG TABS      No current facility-administered medications for this visit.    Family History  Problem Relation Age of Onset  . Hypertension Mother   . Stroke Mother 35       5 days post partum  . Alzheimer's disease Mother   . Hypertension Brother         2 brothers  . Cancer Brother 55       colorectal  . Hypertension Maternal Grandmother   . Thyroid disease Maternal Grandmother        hypothyroidism  . Diabetes Maternal Grandmother        diet controlled  . Dementia Maternal Grandmother 54  . Alzheimer's disease Maternal Grandmother   . Dementia Paternal Aunt   . Heart attack Neg Hx     Review of Systems  All other systems reviewed and are negative.   Exam:   BP 128/70 (BP Location: Right Arm, Patient Position: Sitting, Cuff Size: Large)   Pulse 84   Temp (!) 95.6 F (35.3 C) (Temporal)   Resp 12   Ht  5\' 7"  (1.702 m)   Wt 248 lb (112.5 kg)   LMP 05/14/2007   BMI 38.84 kg/m    Height: 5\' 7"  (170.2 cm)  Ht Readings from Last 3 Encounters:  07/23/19 5\' 7"  (1.702 m)  07/21/19 5\' 7"  (1.702 m)  07/22/18 5\' 7"  (1.702 m)   General appearance: alert, cooperative and appears stated age Head: Normocephalic, without obvious abnormality, atraumatic Neck: no adenopathy, supple, symmetrical, trachea midline and thyroid normal to inspection and palpation Lungs: clear to auscultation bilaterally Breasts: normal appearance, no masses or tenderness Heart: regular rate and rhythm Abdomen: soft, non-tender; bowel sounds normal; no masses,  no organomegaly Extremities: extremities normal, atraumatic, no cyanosis or edema Skin: Skin color, texture, turgor normal. No rashes or lesions Lymph nodes: Cervical, supraclavicular, and axillary nodes normal. No abnormal inguinal nodes palpated Neurologic: Grossly normal   Pelvic: External genitalia:  no  lesions              Urethra:  normal appearing urethra with no masses, tenderness or lesions              Bartholins and Skenes: normal                 Vagina: normal appearing vagina with normal color and discharge, no lesions              Cervix: no lesions              Pap taken: Yes.   Bimanual Exam:  Uterus:  normal size, contour, position, consistency, mobility, non-tender              Adnexa: normal adnexa and no mass, fullness, tenderness               Rectovaginal: Confirms               Anus:  normal sphincter tone, no lesions  Chaperone, Terence Lux, CMA, was present for exam.  A:  Well Woman with normal exam PMP, no HRT Hypertension Diabetes H/o enlarged thyroid with nodules, s/p RAIU H/o colon polyp and family hx of colon cancer in brother  P:   Mammogram guidelines reviewed.  She is aware this is due.  She will schedule it this year. BMD order placed. Blood work just done earlier this week Declines all vaccines except Covid vaccine.   pap smear with HR HPV obtained today return annually or prn

## 2019-07-27 LAB — CYTOLOGY - PAP
Comment: NEGATIVE
Diagnosis: NEGATIVE
High risk HPV: NEGATIVE

## 2019-08-06 ENCOUNTER — Encounter: Payer: BLUE CROSS/BLUE SHIELD | Admitting: Internal Medicine

## 2019-08-14 ENCOUNTER — Other Ambulatory Visit: Payer: Self-pay | Admitting: Internal Medicine

## 2019-10-12 ENCOUNTER — Other Ambulatory Visit: Payer: Self-pay

## 2019-10-12 ENCOUNTER — Other Ambulatory Visit (INDEPENDENT_AMBULATORY_CARE_PROVIDER_SITE_OTHER): Payer: No Typology Code available for payment source

## 2019-10-12 DIAGNOSIS — E119 Type 2 diabetes mellitus without complications: Secondary | ICD-10-CM

## 2019-10-12 LAB — HEMOGLOBIN A1C: Hgb A1c MFr Bld: 6.7 % — ABNORMAL HIGH (ref 4.6–6.5)

## 2019-10-13 ENCOUNTER — Encounter: Payer: Self-pay | Admitting: Internal Medicine

## 2020-01-13 ENCOUNTER — Other Ambulatory Visit: Payer: Self-pay | Admitting: Internal Medicine

## 2020-01-19 ENCOUNTER — Other Ambulatory Visit: Payer: Self-pay | Admitting: Internal Medicine

## 2020-02-15 ENCOUNTER — Other Ambulatory Visit: Payer: Self-pay | Admitting: Internal Medicine

## 2020-02-18 ENCOUNTER — Encounter: Payer: Self-pay | Admitting: Internal Medicine

## 2020-06-06 ENCOUNTER — Ambulatory Visit: Payer: No Typology Code available for payment source | Admitting: Internal Medicine

## 2020-07-27 ENCOUNTER — Other Ambulatory Visit: Payer: Self-pay | Admitting: Internal Medicine

## 2020-10-02 ENCOUNTER — Ambulatory Visit (HOSPITAL_BASED_OUTPATIENT_CLINIC_OR_DEPARTMENT_OTHER): Payer: No Typology Code available for payment source | Admitting: Obstetrics & Gynecology

## 2020-10-27 ENCOUNTER — Ambulatory Visit: Payer: 59

## 2020-10-27 ENCOUNTER — Other Ambulatory Visit: Payer: Self-pay | Admitting: Internal Medicine

## 2020-12-13 ENCOUNTER — Other Ambulatory Visit: Payer: Self-pay | Admitting: Internal Medicine

## 2020-12-21 NOTE — Patient Instructions (Addendum)
Blood work was ordered.     Medications changes include :  none   Your prescription(s) have been submitted to your pharmacy. Please take as directed and contact our office if you believe you are having problem(s) with the medication(s).   A Echo was ordered.   Someone from their office will call you to schedule an appointment.    Please followup in 6 months   Health Maintenance, Female Adopting a healthy lifestyle and getting preventive care are important in promoting health and wellness. Ask your health care provider about: The right schedule for you to have regular tests and exams. Things you can do on your own to prevent diseases and keep yourself healthy. What should I know about diet, weight, and exercise? Eat a healthy diet  Eat a diet that includes plenty of vegetables, fruits, low-fat dairy products, and lean protein. Do not eat a lot of foods that are high in solid fats, added sugars, or sodium.  Maintain a healthy weight Body mass index (BMI) is used to identify weight problems. It estimates body fat based on height and weight. Your health care provider can help determineyour BMI and help you achieve or maintain a healthy weight. Get regular exercise Get regular exercise. This is one of the most important things you can do for your health. Most adults should: Exercise for at least 150 minutes each week. The exercise should increase your heart rate and make you sweat (moderate-intensity exercise). Do strengthening exercises at least twice a week. This is in addition to the moderate-intensity exercise. Spend less time sitting. Even light physical activity can be beneficial. Watch cholesterol and blood lipids Have your blood tested for lipids and cholesterol at 64 years of age, then havethis test every 5 years. Have your cholesterol levels checked more often if: Your lipid or cholesterol levels are high. You are older than 64 years of age. You are at high risk for heart  disease. What should I know about cancer screening? Depending on your health history and family history, you may need to have cancer screening at various ages. This may include screening for: Breast cancer. Cervical cancer. Colorectal cancer. Skin cancer. Lung cancer. What should I know about heart disease, diabetes, and high blood pressure? Blood pressure and heart disease High blood pressure causes heart disease and increases the risk of stroke. This is more likely to develop in people who have high blood pressure readings, are of African descent, or are overweight. Have your blood pressure checked: Every 3-5 years if you are 66-38 years of age. Every year if you are 27 years old or older. Diabetes Have regular diabetes screenings. This checks your fasting blood sugar level. Have the screening done: Once every three years after age 24 if you are at a normal weight and have a low risk for diabetes. More often and at a younger age if you are overweight or have a high risk for diabetes. What should I know about preventing infection? Hepatitis B If you have a higher risk for hepatitis B, you should be screened for this virus. Talk with your health care provider to find out if you are at risk forhepatitis B infection. Hepatitis C Testing is recommended for: Everyone born from [birth year] through 1964. Anyone with known risk factors for hepatitis C. Sexually transmitted infections (STIs) Get screened for STIs, including gonorrhea and chlamydia, if: You are sexually active and are younger than 64 years of age. You are older than 64 years of age and  your health care provider tells you that you are at risk for this type of infection. Your sexual activity has changed since you were last screened, and you are at increased risk for chlamydia or gonorrhea. Ask your health care provider if you are at risk. Ask your health care provider about whether you are at high risk for HIV. Your health care provider  may recommend a prescription medicine to help prevent HIV infection. If you choose to take medicine to prevent HIV, you should first get tested for HIV. You should then be tested every 3 months for as long as you are taking the medicine. Pregnancy If you are about to stop having your period (premenopausal) and you may become pregnant, seek counseling before you get pregnant. Take 400 to 800 micrograms (mcg) of folic acid every day if you become pregnant. Ask for birth control (contraception) if you want to prevent pregnancy. Osteoporosis and menopause Osteoporosis is a disease in which the bones lose minerals and strength with aging. This can result in bone fractures. If you are 43 years old or older, or if you are at risk for osteoporosis and fractures, ask your health care provider if you should: Be screened for bone loss. Take a calcium or vitamin D supplement to lower your risk of fractures. Be given hormone replacement therapy (HRT) to treat symptoms of menopause. Follow these instructions at home: Lifestyle Do not use any products that contain nicotine or tobacco, such as cigarettes, e-cigarettes, and chewing tobacco. If you need help quitting, ask your health care provider. Do not use street drugs. Do not share needles. Ask your health care provider for help if you need support or information about quitting drugs. Alcohol use Do not drink alcohol if: Your health care provider tells you not to drink. You are pregnant, may be pregnant, or are planning to become pregnant. If you drink alcohol: Limit how much you use to 0-1 drink a day. Limit intake if you are breastfeeding. Be aware of how much alcohol is in your drink. In the [place] one drink equals one 12 oz bottle of beer (355 mL), one 5 oz glass of wine (148 mL), or one 1 oz glass of hard liquor (44 mL). General instructions Schedule regular health, dental, and eye exams. Stay current with your vaccines. Tell your health care  provider if: You often feel depressed. You have ever been abused or do not feel safe at home. Summary Adopting a healthy lifestyle and getting preventive care are important in promoting health and wellness. Follow your health care provider's instructions about healthy diet, exercising, and getting tested or screened for diseases. Follow your health care provider's instructions on monitoring your cholesterol and blood pressure. This information is not intended to replace advice given to you by your health care provider. Make sure you discuss any questions you have with your healthcare provider. Document Revised: 04/22/2018 Document Reviewed: 04/22/2018 Elsevier Patient Education  2022 [employer].

## 2020-12-21 NOTE — Progress Notes (Signed)
Subjective:    Patient ID: Kathleen Lucero, female    DOB: 06/16/1955, 64 y.o.   MRN: [id number]   This visit occurred during the SARS-CoV-2 public health emergency.  Safety protocols were in place, including screening questions prior to the visit, additional usage of staff PPE, and extensive cleaning of exam room while observing appropriate contact time as indicated for disinfecting solutions.    HPI She is here for a physical exam.   She feels really good.  She is journaling.  She feels better now than she has in a while.  Her family is all doing good and she thinks that makes a big difference.  Work is good.  She is thinking about retiring in a year or so.    Medications and allergies reviewed with patient and updated if appropriate.  Patient Active Problem List   Diagnosis Date Noted   Chronic constipation 07/21/2019   Bicuspid aortic valve 09/18/2016   Mitral valve insufficiency, moderate 09/18/2016   Type 2 diabetes mellitus without complication, without long-term current use of insulin ([place]) 09/18/2016   Fibroids, intramural 03/28/2015   Obesity (BMI 30-39.9) 11/20/2013   History of colonic polyps 11/19/2013   Nonspecific abnormal electrocardiogram (ECG) (EKG) 10/04/2011   History of hyperthyroidism, s/p RAI 11/02/2008   GOITER, MULTINODULAR 09/13/2008   Sidney DISEASE, LUMBOSACRAL SPINE 08/30/2008   Hyperlipidemia 11/05/2006   Essential hypertension 11/05/2006    Current Outpatient Medications on File Prior to Visit  Medication Sig Dispense Refill   Cholecalciferol (VITAMIN D) 125 MCG (5000 UT) CAPS      Zinc 50 MG TABS      No current facility-administered medications on file prior to visit.    Past Medical History:  Diagnosis Date   Diabetes mellitus ([place])    A1c 6.7 in 3/18   GERD (gastroesophageal reflux disease) 2007   Goiter    Heart murmur    "my whole life"   Hyperlipidemia    Hypertension    Lumbar degenerative disc disease    S/P ruptured  disc, Dr. Lindwood Qua   T3 thyrotoxicosis 2011   S/P RAI   Uterine fibroid    Dr Sabra Heck    Past Surgical History:  Procedure Laterality Date   colonoscopy with polypectomy  2011   Dr Floria Raveling done 2013 as recommended   TUBAL LIGATION  1981   WISDOM TOOTH EXTRACTION      Social History   Socioeconomic History   Marital status: Married    Spouse name: Not on file   Number of children: 3   Years of education: Not on file   Highest education level: Not on file  Occupational History   Occupation: Best boy: [employer]  Tobacco Use   Smoking status: Never   Smokeless tobacco: Never  Vaping Use   Vaping Use: Never used  Substance and Sexual Activity   Alcohol use: No   Drug use: No   Sexual activity: Yes    Partners: Male    Birth control/protection: Surgical, Post-menopausal    Comment: BTL  Other Topics Concern   Not on file  Social History Narrative   Walks 2X wkly   3 sons, 6 grandchildren   [place] from [place], [place]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   Social Determinants of Radio broadcast assistant Strain: Not on file  Food Insecurity: Not on file  Transportation Needs: Not on file  Physical Activity: Not on file  Stress: Not on file  Social  Connections: Not on file    Family History  Problem Relation Age of Onset   Hypertension Mother    Stroke Mother 66       5 days post partum   Alzheimer's disease Mother    Hypertension Brother         2 brothers   Cancer Brother 64       colorectal   Hypertension Maternal Grandmother    Thyroid disease Maternal Grandmother        hypothyroidism   Diabetes Maternal Grandmother        diet controlled   Dementia Maternal Grandmother 87   Alzheimer's disease Maternal Grandmother    Dementia Paternal Aunt    Heart attack Neg Hx     Review of Systems  Constitutional:  Negative for chills and fever.  Eyes:  Negative for visual disturbance.  Respiratory:  Negative for cough, shortness of breath and wheezing.    Cardiovascular:  Positive for leg swelling (from sitting all day). Negative for chest pain and palpitations.  Gastrointestinal:  Positive for constipation. Negative for abdominal pain, blood in stool, diarrhea and nausea.       No gerd  Genitourinary:  Negative for dysuria.  Musculoskeletal:  Negative for arthralgias and back pain.  Skin:  Negative for color change and rash.  Neurological:  Negative for light-headedness and headaches.  Psychiatric/Behavioral:  Negative for dysphoric mood. The patient is not nervous/anxious.       Objective:   Vitals:   12/22/20 0935  BP: 140/78  Pulse: 80  Temp: 98.3 F (36.8 C)  SpO2: 97%   Filed Weights   12/22/20 0935  Weight: 240 lb (108.9 kg)   Body mass index is 37.59 kg/m.  BP Readings from Last 3 Encounters:  12/22/20 140/78  07/23/19 128/70  07/21/19 (!) 164/98    Wt Readings from Last 3 Encounters:  12/22/20 240 lb (108.9 kg)  07/23/19 248 lb (112.5 kg)  07/21/19 252 lb (114.3 kg)    Depression screen [facility] 2/9 12/22/2020 07/21/2019 02/13/2017 08/06/2016  Decreased Interest 0 1 0 0  Down, Depressed, Hopeless 0 1 0 0  PHQ - 2 Score 0 2 0 0  Altered sleeping 0 0 - -  Tired, decreased energy 0 0 - -  Change in appetite 1 2 - -  Feeling bad or failure about yourself  0 0 - -  Trouble concentrating 0 0 - -  Moving slowly or fidgety/restless 0 0 - -  Suicidal thoughts 0 0 - -  PHQ-9 Score 1 4 - -  Difficult doing work/chores Not difficult at all - - -    GAD 7 : Generalized Anxiety Score 12/22/2020 07/21/2019  Nervous, Anxious, on Edge 0 1  Control/stop worrying 0 1  Worry too much - different things 0 0  Trouble relaxing 0 0  Restless 0 0  Easily annoyed or irritable 0 0  Afraid - awful might happen 0 1  Total GAD 7 Score 0 3  Anxiety Difficulty Not difficult at all -       Physical Exam Constitutional: She appears well-developed and well-nourished. No distress.  HENT:  Head: Normocephalic and atraumatic.  Right  Ear: External ear normal. Normal ear canal and TM Left Ear: External ear normal.  Normal ear canal and TM Mouth/Throat: Oropharynx is clear and moist.  Eyes: Conjunctivae and EOM are normal.  Neck: Neck supple. No tracheal deviation present. No thyromegaly present.  No carotid bruit  Cardiovascular: Normal  rate, regular rhythm and normal heart sounds.   2/6 sys murmur heard.  No edema. Pulmonary/Chest: Effort normal and breath sounds normal. No respiratory distress. She has no wheezes. She has no rales.  Breast: deferred   Abdominal: Soft. She exhibits no distension. There is no tenderness.  Lymphadenopathy: She has no cervical adenopathy.  Skin: Skin is warm and dry. She is not diaphoretic.  Psychiatric: She has a normal mood and affect. Her behavior is normal.    Diabetic Foot Exam - Simple   Simple Foot Form Diabetic Foot exam was performed with the following findings: Yes 12/22/2020 10:27 AM  Visual Inspection No deformities, no ulcerations, no other skin breakdown bilaterally: Yes Sensation Testing Intact to touch and monofilament testing bilaterally: Yes Pulse Check Posterior Tibialis and Dorsalis pulse intact bilaterally: Yes Comments       Lab Results  Component Value Date   WBC 6.9 12/22/2020   HGB 14.5 12/22/2020   HCT 44.4 12/22/2020   PLT 202.0 12/22/2020   GLUCOSE 107 (H) 12/22/2020   CHOL 183 12/22/2020   TRIG 112.0 12/22/2020   HDL 50.70 12/22/2020   LDLDIRECT 130.2 11/05/2006   LDLCALC 109 (H) 12/22/2020   ALT 23 12/22/2020   AST 16 12/22/2020   NA 138 12/22/2020   K 4.1 12/22/2020   CL 103 12/22/2020   CREATININE 1.06 12/22/2020   BUN 17 12/22/2020   CO2 25 12/22/2020   TSH 2.30 12/22/2020   HGBA1C 6.9 (H) 12/22/2020   MICROALBUR 0.7 12/22/2020         Assessment & Plan:   Physical exam: Screening blood work  ordered Exercise  minimal walking Weight  encouraged weight loss Substance abuse  none  Will schedule eye exam, gyn visit  scheduled   Reviewed recommended immunizations.   Screened for depression using the PHQ 9 scale.  No evidence of depression.   Screened for anxiety using GAD7 Scale.  No evidence of anxiety.    Health Maintenance  Topic Date Due   OPHTHALMOLOGY EXAM  Never done   TETANUS/TDAP  Never done   Zoster Vaccines- Shingrix (1 of 2) Never done   MAMMOGRAM  05/11/2020   COVID-19 Vaccine (3 - Booster for [employer] series) 07/28/2020   INFLUENZA VACCINE  12/11/2020   PNEUMOCOCCAL POLYSACCHARIDE VACCINE AGE 16-64 HIGH RISK  12/22/2021 (Originally 01/02/1958)   HEMOGLOBIN A1C  06/24/2021   COLONOSCOPY (Pts 45-105yr Insurance coverage will need to be confirmed)  09/30/2021   FOOT EXAM  12/22/2021   URINE MICROALBUMIN  12/22/2021   PAP SMEAR-Modifier  07/23/2022   Hepatitis C Screening  Completed   HIV Screening  Completed   Pneumococcal Vaccine [id number] old  Aged Out   HPV VACCINES  Aged Out          See Problem List for Assessment and Plan of chronic medical problems.

## 2020-12-22 ENCOUNTER — Other Ambulatory Visit: Payer: Self-pay

## 2020-12-22 ENCOUNTER — Ambulatory Visit (INDEPENDENT_AMBULATORY_CARE_PROVIDER_SITE_OTHER): Payer: No Typology Code available for payment source | Admitting: Internal Medicine

## 2020-12-22 ENCOUNTER — Encounter: Payer: Self-pay | Admitting: Internal Medicine

## 2020-12-22 VITALS — BP 140/78 | HR 80 | Temp 98.3°F | Ht 67.0 in | Wt 240.0 lb

## 2020-12-22 DIAGNOSIS — E119 Type 2 diabetes mellitus without complications: Secondary | ICD-10-CM

## 2020-12-22 DIAGNOSIS — Z0000 Encounter for general adult medical examination without abnormal findings: Secondary | ICD-10-CM

## 2020-12-22 DIAGNOSIS — E7849 Other hyperlipidemia: Secondary | ICD-10-CM | POA: Diagnosis not present

## 2020-12-22 DIAGNOSIS — I10 Essential (primary) hypertension: Secondary | ICD-10-CM | POA: Diagnosis not present

## 2020-12-22 DIAGNOSIS — Z1331 Encounter for screening for depression: Secondary | ICD-10-CM | POA: Diagnosis not present

## 2020-12-22 DIAGNOSIS — Q231 Congenital insufficiency of aortic valve: Secondary | ICD-10-CM

## 2020-12-22 DIAGNOSIS — I340 Nonrheumatic mitral (valve) insufficiency: Secondary | ICD-10-CM | POA: Diagnosis not present

## 2020-12-22 DIAGNOSIS — E042 Nontoxic multinodular goiter: Secondary | ICD-10-CM

## 2020-12-22 DIAGNOSIS — Q2381 Bicuspid aortic valve: Secondary | ICD-10-CM

## 2020-12-22 LAB — CBC WITH DIFFERENTIAL/PLATELET
Basophils Absolute: 0.1 10*3/uL (ref 0.0–0.1)
Basophils Relative: 0.9 % (ref 0.0–3.0)
Eosinophils Absolute: 0.3 10*3/uL (ref 0.0–0.7)
Eosinophils Relative: 4 % (ref 0.0–5.0)
HCT: 44.4 % (ref 36.0–46.0)
Hemoglobin: 14.5 g/dL (ref 12.0–15.0)
Lymphocytes Relative: 28.8 % (ref 12.0–46.0)
Lymphs Abs: 2 10*3/uL (ref 0.7–4.0)
MCHC: 32.6 g/dL (ref 30.0–36.0)
MCV: 83.8 fl (ref 78.0–100.0)
Monocytes Absolute: 0.5 10*3/uL (ref 0.1–1.0)
Monocytes Relative: 7.5 % (ref 3.0–12.0)
Neutro Abs: 4.1 10*3/uL (ref 1.4–7.7)
Neutrophils Relative %: 58.8 % (ref 43.0–77.0)
Platelets: 202 10*3/uL (ref 150.0–400.0)
RBC: 5.3 Mil/uL — ABNORMAL HIGH (ref 3.87–5.11)
RDW: 13.9 % (ref 11.5–15.5)
WBC: 6.9 10*3/uL (ref 4.0–10.5)

## 2020-12-22 LAB — LIPID PANEL
Cholesterol: 183 mg/dL (ref 0–200)
HDL: 50.7 mg/dL (ref >39.00)
LDL Cholesterol: 109 mg/dL — ABNORMAL HIGH (ref 0–99)
NonHDL: 131.89
Total CHOL/HDL Ratio: 4
Triglycerides: 112 mg/dL (ref 0.0–149.0)
VLDL: 22.4 mg/dL (ref 0.0–40.0)

## 2020-12-22 LAB — MICROALBUMIN / CREATININE URINE RATIO
Creatinine,U: 58.5 mg/dL
Microalb Creat Ratio: 1.3 mg/g (ref 0.0–30.0)
Microalb, Ur: 0.7 mg/dL (ref 0.0–1.9)

## 2020-12-22 LAB — COMPREHENSIVE METABOLIC PANEL
ALT: 23 U/L (ref 0–35)
AST: 16 U/L (ref 0–37)
Albumin: 4.7 g/dL (ref 3.5–5.2)
Alkaline Phosphatase: 105 U/L (ref 39–117)
BUN: 17 mg/dL (ref 6–23)
CO2: 25 mEq/L (ref 19–32)
Calcium: 10.3 mg/dL (ref 8.4–10.5)
Chloride: 103 mEq/L (ref 96–112)
Creatinine, Ser: 1.06 mg/dL (ref 0.40–1.20)
GFR: 55.73 mL/min — ABNORMAL LOW (ref >60.00)
Glucose, Bld: 107 mg/dL — ABNORMAL HIGH (ref 70–99)
Potassium: 4.1 mEq/L (ref 3.5–5.1)
Sodium: 138 mEq/L (ref 135–145)
Total Bilirubin: 0.6 mg/dL (ref 0.2–1.2)
Total Protein: 8 g/dL (ref 6.0–8.3)

## 2020-12-22 LAB — HEMOGLOBIN A1C: Hgb A1c MFr Bld: 6.9 % — ABNORMAL HIGH (ref 4.6–6.5)

## 2020-12-22 LAB — TSH: TSH: 2.3 u[IU]/mL (ref 0.35–5.50)

## 2020-12-22 MED ORDER — ROSUVASTATIN CALCIUM 20 MG PO TABS: 20.0000 mg | ORAL_TABLET | Freq: Every day | ORAL | 3 refills | Status: DC

## 2020-12-22 MED ORDER — SPIRONOLACTONE 25 MG PO TABS: 25.0000 mg | ORAL_TABLET | Freq: Two times a day (BID) | ORAL | 3 refills | Status: DC

## 2020-12-22 MED ORDER — DILTIAZEM HCL ER COATED BEADS 180 MG PO CP24: 180.0000 mg | ORAL_CAPSULE | Freq: Every day | ORAL | 3 refills | Status: DC

## 2020-12-22 NOTE — Assessment & Plan Note (Signed)
Chronic Sugars have been controlled with diet She has lost weight She has become a little bit more active, but encouraged more regular exercise and to build on what she is already doing Low sugar/carbohydrate diet Check A1c, microalbumin

## 2020-12-22 NOTE — Assessment & Plan Note (Signed)
Chronic Blood pressure controlled Continue diltiazem 180 mg daily, spironolactone 25 mg twice daily CMP

## 2020-12-22 NOTE — Assessment & Plan Note (Signed)
Chronic Echo ordered Discussed the importance of monitoring her valve No concerning symptoms of shortness of breath, chest pain, palpitations

## 2020-12-22 NOTE — Assessment & Plan Note (Addendum)
Chronic Denies any change in size of her thyroid, no difficulty swallowing Clinically euthyroid Check TSH

## 2020-12-22 NOTE — Assessment & Plan Note (Signed)
Chronic Due for echo-ordered and she will have this done

## 2020-12-22 NOTE — Assessment & Plan Note (Signed)
Chronic Check lipid panel  Continue crestor 20 mg qd Regular exercise and healthy diet encouraged  

## 2021-01-03 ENCOUNTER — Encounter (HOSPITAL_COMMUNITY): Payer: Self-pay | Admitting: Internal Medicine

## 2021-01-17 ENCOUNTER — Telehealth (HOSPITAL_COMMUNITY): Payer: Self-pay | Admitting: Internal Medicine

## 2021-01-17 NOTE — Telephone Encounter (Signed)
Just an FYI. We have made several attempts to contact this patient including sending a letter to schedule or reschedule their echocardiogram. We will be removing the patient from the echo WQ.  MAILED LETTER [facility] 01/03/21  01/03/21 [facility] to schedule @ 10:33/[facility]  01/02/21 [facility] to schedule@ 3:21/[facility]  12/28/20 [facility] to schedule @ 3:30/[facility]       Thank you

## 2021-01-22 ENCOUNTER — Encounter (HOSPITAL_BASED_OUTPATIENT_CLINIC_OR_DEPARTMENT_OTHER): Payer: Self-pay | Admitting: Obstetrics & Gynecology

## 2021-01-22 ENCOUNTER — Ambulatory Visit (HOSPITAL_BASED_OUTPATIENT_CLINIC_OR_DEPARTMENT_OTHER): Payer: No Typology Code available for payment source | Admitting: Obstetrics & Gynecology

## 2021-01-22 ENCOUNTER — Other Ambulatory Visit: Payer: Self-pay

## 2021-01-22 VITALS — BP 149/78 | HR 79 | Ht 67.0 in | Wt 236.2 lb

## 2021-01-22 DIAGNOSIS — R1032 Left lower quadrant pain: Secondary | ICD-10-CM

## 2021-01-22 DIAGNOSIS — R350 Frequency of micturition: Secondary | ICD-10-CM | POA: Diagnosis not present

## 2021-01-22 DIAGNOSIS — N852 Hypertrophy of uterus: Secondary | ICD-10-CM | POA: Diagnosis not present

## 2021-01-22 DIAGNOSIS — K5792 Diverticulitis of intestine, part unspecified, without perforation or abscess without bleeding: Secondary | ICD-10-CM

## 2021-01-22 LAB — POCT URINALYSIS DIPSTICK
Bilirubin, UA: NEGATIVE
Glucose, UA: NEGATIVE
Ketones, UA: NEGATIVE
Leukocytes, UA: NEGATIVE
Nitrite, UA: NEGATIVE
Protein, UA: NEGATIVE
Spec Grav, UA: 1.025 (ref 1.010–1.025)
Urobilinogen, UA: 2 E.U./dL — AB
pH, UA: 5.5 (ref 5.0–8.0)

## 2021-01-22 LAB — COMPREHENSIVE METABOLIC PANEL
ALT: 16 IU/L (ref 0–32)
AST: 13 IU/L (ref 0–40)
Albumin/Globulin Ratio: 1.6 (ref 1.2–2.2)
Albumin: 5.1 g/dL — ABNORMAL HIGH (ref 3.8–4.8)
Alkaline Phosphatase: 139 IU/L — ABNORMAL HIGH (ref 44–121)
BUN/Creatinine Ratio: 14 (ref 12–28)
BUN: 15 mg/dL (ref 8–27)
Bilirubin Total: 0.8 mg/dL (ref 0.0–1.2)
CO2: 22 mmol/L (ref 20–29)
Calcium: 10.7 mg/dL — ABNORMAL HIGH (ref 8.7–10.3)
Chloride: 99 mmol/L (ref 96–106)
Creatinine, Ser: 1.1 mg/dL — ABNORMAL HIGH (ref 0.57–1.00)
Globulin, Total: 3.1 g/dL (ref 1.5–4.5)
Glucose: 129 mg/dL — ABNORMAL HIGH (ref 65–99)
Potassium: 4.6 mmol/L (ref 3.5–5.2)
Sodium: 138 mmol/L (ref 134–144)
Total Protein: 8.2 g/dL (ref 6.0–8.5)
eGFR: 56 mL/min/{1.73_m2} — ABNORMAL LOW (ref >59)

## 2021-01-22 LAB — CBC WITH DIFFERENTIAL/PLATELET
Basophils Absolute: 0.1 10*3/uL (ref 0.0–0.2)
Basos: 1 %
EOS (ABSOLUTE): 0.1 10*3/uL (ref 0.0–0.4)
Eos: 1 %
Hematocrit: 45.5 % (ref 34.0–46.6)
Hemoglobin: 15 g/dL (ref 11.1–15.9)
Immature Grans (Abs): 0 10*3/uL (ref 0.0–0.1)
Immature Granulocytes: 0 %
Lymphocytes Absolute: 1.8 10*3/uL (ref 0.7–3.1)
Lymphs: 17 %
MCH: 27.1 pg (ref 26.6–33.0)
MCHC: 33 g/dL (ref 31.5–35.7)
MCV: 82 fL (ref 79–97)
Monocytes Absolute: 0.8 10*3/uL (ref 0.1–0.9)
Monocytes: 7 %
Neutrophils Absolute: 8 10*3/uL — ABNORMAL HIGH (ref 1.4–7.0)
Neutrophils: 74 %
Platelets: 240 10*3/uL (ref 150–450)
RBC: 5.54 x10E6/uL — ABNORMAL HIGH (ref 3.77–5.28)
RDW: 12.7 % (ref 11.7–15.4)
WBC: 10.8 10*3/uL (ref 3.4–10.8)

## 2021-01-22 MED ORDER — CIPROFLOXACIN HCL 500 MG PO TABS: 500.0000 mg | ORAL_TABLET | Freq: Two times a day (BID) | ORAL | 0 refills | Status: DC

## 2021-01-22 MED ORDER — METRONIDAZOLE 500 MG PO TABS: 500.0000 mg | ORAL_TABLET | Freq: Three times a day (TID) | ORAL | 0 refills | Status: AC

## 2021-01-22 MED ORDER — CIPROFLOXACIN HCL 500 MG PO TABS: 500.0000 mg | ORAL_TABLET | Freq: Two times a day (BID) | ORAL | 0 refills | Status: AC

## 2021-01-22 NOTE — Progress Notes (Signed)
GYNECOLOGY  VISIT  CC:   pelvic pressure, frequent urination, LLQ pain  HPI: 65 y.o. [id number] Married [place] American female here for increased urinary frequency and pelvic pressure that started on Friday.  Is not having any dysuria with her frequency.  Denies seeing any blood in urine.  Denies fever/chills.  Denies nausea.  Feels like she is constipated but that is not a new symptom.  Denies vaginal bleeding.  No vaginal discharge.  Pain is located primarily in the LLQ.  Reviewed prior colonoscopy done in 2019 and she does have hx of diverticulosis but never diverticulitis.  Patient Active Problem List   Diagnosis Date Noted   Chronic constipation 07/21/2019   Bicuspid aortic valve 09/18/2016   Mitral valve insufficiency, moderate 09/18/2016   Type 2 diabetes mellitus without complication, without long-term current use of insulin ([place]) 09/18/2016   Fibroids, intramural 03/28/2015   Obesity (BMI 30-39.9) 11/20/2013   History of colonic polyps 11/19/2013   Nonspecific abnormal electrocardiogram (ECG) (EKG) 10/04/2011   History of hyperthyroidism, s/p RAI 11/02/2008   GOITER, MULTINODULAR 09/13/2008   [place] DISEASE, LUMBOSACRAL SPINE 08/30/2008   Hyperlipidemia 11/05/2006   Essential hypertension 11/05/2006    Past Medical History:  Diagnosis Date   Diabetes mellitus ([place])    A1c 6.7 in 3/18   GERD (gastroesophageal reflux disease) 2007   Goiter    Heart murmur    "my whole life"   Hyperlipidemia    Hypertension    Lumbar degenerative disc disease    S/P ruptured disc, Dr. Lindwood Qua   T3 thyrotoxicosis 2011   S/P RAI   Uterine fibroid    Dr Sabra Heck    Past Surgical History:  Procedure Laterality Date   TUBAL LIGATION  05/14/1979   WISDOM TOOTH EXTRACTION      MEDS:   Current Outpatient Medications on File Prior to Visit  Medication Sig Dispense Refill   Ascorbic Acid (VITAMIN C) 100 MG tablet Take 100 mg by mouth daily.     diltiazem (CARDIZEM CD) 180 MG 24  hr capsule Take 1 capsule (180 mg total) by mouth daily. 90 capsule 3   rosuvastatin (CRESTOR) 20 MG tablet Take 1 tablet (20 mg total) by mouth daily. 90 tablet 3   spironolactone (ALDACTONE) 25 MG tablet Take 1 tablet (25 mg total) by mouth 2 (two) times daily. 180 tablet 3   Zinc 50 MG TABS      Cholecalciferol (VITAMIN D) 125 MCG (5000 UT) CAPS  (Patient not taking: Reported on 01/22/2021)     No current facility-administered medications on file prior to visit.    ALLERGIES: Other and Codeine sulfate  Family History  Problem Relation Age of Onset   Hypertension Mother    Stroke Mother 59       5 days post partum   Alzheimer's disease Mother    Hypertension Brother         2 brothers   Cancer Brother 71       colorectal   Hypertension Maternal Grandmother    Thyroid disease Maternal Grandmother        hypothyroidism   Diabetes Maternal Grandmother        diet controlled   Dementia Maternal Grandmother 87   Alzheimer's disease Maternal Grandmother    Dementia Paternal Aunt    Heart attack Neg Hx     SH:  married, non smoker  Review of Systems  Constitutional: Negative.   Respiratory: Negative.    Gastrointestinal:  Positive for abdominal pain and constipation. Negative for abdominal distention, blood in stool, diarrhea, nausea, rectal pain and vomiting.  Genitourinary:  Positive for frequency and pelvic pain. Negative for dysuria, flank pain, hematuria, vaginal bleeding and vaginal discharge.  Neurological: Negative.   Psychiatric/Behavioral: Negative.     PHYSICAL EXAMINATION:    BP (!) 149/78 (BP Location: Right Arm, Patient Position: Sitting, Cuff Size: Large)   Pulse 79   Ht '5\' 7"'$  (1.702 m)   Wt 236 lb 3.2 oz (107.1 kg)   LMP 05/14/2007   BMI 36.99 kg/m     General appearance: alert, cooperative and appears stated age CV:  Regular rate and rhythm Lungs:  clear to auscultation, no wheezes, rales or rhonchi, symmetric air entry Flank:  no CVA  tenderness Abdomen: soft, +LLQ tenderness to palpation, no rebound or tenderness; quiet bowel sounds, no masses,  no organomegaly Lymph:  no inguinal LAD noted  Pelvic: External genitalia:  no lesions              Urethra:  normal appearing urethra with no masses, tenderness or lesions              Bartholins and Skenes: normal                 Vagina: normal appearing vagina with normal color and discharge, no lesions              Cervix: no lesions              Bimanual Exam:  Uterus:  enlarged, 8-10 weeks size and non tender              Adnexa: no mass, fullness, tenderness              Rectovaginal: Yes.  .  Confirms.              Anus:  normal sphincter tone, no lesions  Chaperone, Octaviano Batty, CMA, was present for exam.  Assessment/Plan: 1. Left lower quadrant abdominal pain - pt aware I am concerned about diverticulitis.  Current guidelines for no treatment with antibiotics discussed.  However, I am not completed sure of diagnosis.  Reasons to be seen in ER discussed. - will start treatment with cipro '500mg'$  bid x 10 days and flagyl '500mg'$  tid x 10 days. - Urine Culture - CBC with Differential/Platelet - CT ABDOMEN PELVIS WO CONTRAST; Future - Comprehensive metabolic panel - liquid diet recommended  2. Frequency of urination - POCT Urinalysis Dipstick - Urine Culture  3. Diverticulitis  4. Enlarged uterus - h/o uterine fibroids, stable exam today

## 2021-01-23 ENCOUNTER — Encounter (HOSPITAL_BASED_OUTPATIENT_CLINIC_OR_DEPARTMENT_OTHER): Payer: Self-pay | Admitting: *Deleted

## 2021-01-23 ENCOUNTER — Encounter (HOSPITAL_BASED_OUTPATIENT_CLINIC_OR_DEPARTMENT_OTHER): Payer: Self-pay | Admitting: Obstetrics & Gynecology

## 2021-01-24 LAB — URINE CULTURE: Organism ID, Bacteria: NO GROWTH

## 2021-01-26 ENCOUNTER — Ambulatory Visit
Admission: RE | Admit: 2021-01-26 | Discharge: 2021-01-26 | Disposition: A | Discharge status: Home / Self Care | Payer: No Typology Code available for payment source | Source: Ambulatory Visit | Attending: Obstetrics & Gynecology | Admitting: Obstetrics & Gynecology

## 2021-01-26 DIAGNOSIS — K5792 Diverticulitis of intestine, part unspecified, without perforation or abscess without bleeding: Secondary | ICD-10-CM

## 2021-01-26 DIAGNOSIS — R1032 Left lower quadrant pain: Secondary | ICD-10-CM

## 2021-02-09 ENCOUNTER — Ambulatory Visit (INDEPENDENT_AMBULATORY_CARE_PROVIDER_SITE_OTHER): Payer: No Typology Code available for payment source | Admitting: Obstetrics & Gynecology

## 2021-02-09 ENCOUNTER — Other Ambulatory Visit: Payer: Self-pay

## 2021-02-09 ENCOUNTER — Encounter (HOSPITAL_BASED_OUTPATIENT_CLINIC_OR_DEPARTMENT_OTHER): Payer: Self-pay | Admitting: Obstetrics & Gynecology

## 2021-02-09 VITALS — BP 141/68 | HR 65 | Ht 67.0 in | Wt 236.0 lb

## 2021-02-09 DIAGNOSIS — Z780 Asymptomatic menopausal state: Secondary | ICD-10-CM | POA: Diagnosis not present

## 2021-02-09 DIAGNOSIS — Z8719 Personal history of other diseases of the digestive system: Secondary | ICD-10-CM

## 2021-02-09 DIAGNOSIS — Z800 Family history of malignant neoplasm of digestive organs: Secondary | ICD-10-CM

## 2021-02-09 DIAGNOSIS — Z86010 Personal history of colonic polyps: Secondary | ICD-10-CM | POA: Diagnosis not present

## 2021-02-09 DIAGNOSIS — D251 Intramural leiomyoma of uterus: Secondary | ICD-10-CM

## 2021-02-09 DIAGNOSIS — Z01419 Encounter for gynecological examination (general) (routine) without abnormal findings: Secondary | ICD-10-CM | POA: Diagnosis not present

## 2021-02-09 NOTE — Progress Notes (Signed)
65 y.o. [id number] Married Kathleen Lucero here for annual exam.  Had LLQ pain a few weeks ago and came to see me.  I felt this was diverticulitis.  Abx started.  CT obtained confirming this.  Reports she feels back to normal.  Bowel movement are normal.  We discussed follow up with GI, Dr. Collene Mares.  Increased fiber discussed.  No additional dietary modifications needed.  Denies vaginal bleeding.    Patient's last menstrual period was 05/14/2007.          Sexually active: Yes.    The current method of family planning is post menopausal status.    Exercising: Yes.     walking Smoker:  no  Health Maintenance: Pap:  07/23/2019 Negative, neg HR HPV History of abnormal Pap:  no MMG:  05/11/2018 Negative Colonoscopy:  09/30/2016, follow up 5 years BMD:   plan with next MMG Screening Labs: done with Dr. Quay Burow   reports that she has never smoked. She has never used smokeless tobacco. She reports that she does not drink alcohol and does not use drugs.  Past Medical History:  Diagnosis Date   Diabetes mellitus ([place])    A1c 6.7 in 3/18   GERD (gastroesophageal reflux disease) 2007   Goiter    Heart murmur    "my whole life"   Hyperlipidemia    Hypertension    Lumbar degenerative disc disease    S/P ruptured disc, Dr. Lindwood Qua   T3 thyrotoxicosis 2011   S/P RAI   Uterine fibroid    Dr Sabra Heck    Past Surgical History:  Procedure Laterality Date   TUBAL LIGATION  05/14/1979   WISDOM TOOTH EXTRACTION      Current Outpatient Medications  Medication Sig Dispense Refill   Ascorbic Acid (VITAMIN C) 100 MG tablet Take 100 mg by mouth daily.     diltiazem (CARDIZEM CD) 180 MG 24 hr capsule Take 1 capsule (180 mg total) by mouth daily. 90 capsule 3   rosuvastatin (CRESTOR) 20 MG tablet Take 1 tablet (20 mg total) by mouth daily. 90 tablet 3   spironolactone (ALDACTONE) 25 MG tablet Take 1 tablet (25 mg total) by mouth 2 (two) times daily. 180 tablet 3   Zinc 50 MG TABS       Cholecalciferol (VITAMIN D) 125 MCG (5000 UT) CAPS  (Patient not taking: No sig reported)     No current facility-administered medications for this visit.    Family History  Problem Relation Age of Onset   Hypertension Mother    Stroke Mother 24       5 days post partum   Alzheimer's disease Mother    Hypertension Brother         2 brothers   Cancer Brother 57       colorectal   Hypertension Maternal Grandmother    Thyroid disease Maternal Grandmother        hypothyroidism   Diabetes Maternal Grandmother        diet controlled   Dementia Maternal Grandmother 87   Alzheimer's disease Maternal Grandmother    Dementia Paternal Aunt    Heart attack Neg Hx     Review of Systems  All other systems reviewed and are negative.  Exam:   BP (!) 141/68 (BP Location: Left Arm, Patient Position: Sitting, Cuff Size: Large)   Pulse 65   Ht 5\' 7"  (1.702 m)   Wt 236 lb (107 kg)   LMP 05/14/2007  BMI 36.96 kg/m   Height: 5\' 7"  (170.2 cm)  General appearance: alert, cooperative and appears stated age Head: Normocephalic, without obvious abnormality, atraumatic Neck: no adenopathy, supple, symmetrical, trachea midline and thyroid normal to inspection and palpation Lungs: clear to auscultation bilaterally Breasts: normal appearance, no masses or tenderness Heart: regular rate and rhythm Abdomen: soft, non-tender; bowel sounds normal; no masses,  no organomegaly Extremities: extremities normal, atraumatic, no cyanosis or edema Skin: Skin color, texture, turgor normal. No rashes or lesions Lymph nodes: Cervical, supraclavicular, and axillary nodes normal. No abnormal inguinal nodes palpated Neurologic: Grossly normal   Pelvic: External genitalia:  no lesions              Urethra:  normal appearing urethra with no masses, tenderness or lesions              Bartholins and Skenes: normal                 Vagina: normal appearing vagina with normal color and no discharge, no lesions               Cervix: no lesions              Pap taken: No. Bimanual Exam:  Uterus:  enlarged, 10 weeks size, stable              Adnexa: normal adnexa               Rectovaginal: Confirms               Anus:  normal sphincter tone, no lesions  Chaperone, Octaviano Batty, CMA, was present for exam.  Assessment/Plan: 1. Well woman exam with routine gynecological exam - pap and HR HPV obtained 2021 - will call for updated MMG and BMD - Colonoscopy due next year. - lab work done with Dr. Quay Burow - Care Gaps updated/reviewed  2. Postmenopausal - no HRT  3. Fibroids, intramural  4. History of colonic polyps  5. History of diverticulitis - pt advised to follow up with Dr. Collene Mares  6. Family history of colon cancer

## 2021-03-05 ENCOUNTER — Encounter (HOSPITAL_BASED_OUTPATIENT_CLINIC_OR_DEPARTMENT_OTHER): Payer: Self-pay | Admitting: *Deleted

## 2021-04-26 ENCOUNTER — Telehealth (INDEPENDENT_AMBULATORY_CARE_PROVIDER_SITE_OTHER): Payer: No Typology Code available for payment source | Admitting: Family Medicine

## 2021-04-26 ENCOUNTER — Encounter: Payer: Self-pay | Admitting: Family Medicine

## 2021-04-26 DIAGNOSIS — U071 COVID-19: Secondary | ICD-10-CM

## 2021-04-26 MED ORDER — BENZONATATE 100 MG PO CAPS: ORAL_CAPSULE | ORAL | 0 refills | Status: DC

## 2021-04-26 NOTE — Progress Notes (Signed)
Virtual Visit via Video Note  I connected with Kathleen Lucero  on 04/26/21 at 10:40 AM EST by a video enabled telemedicine application and verified that I am speaking with the correct person using two identifiers.  Location patient: home, [place] Location provider:work or home office Persons participating in the virtual visit: patient, provider  I discussed the limitations of evaluation and management by telemedicine and the availability of in person appointments. The patient expressed understanding and agreed to proceed.   HPI:  Acute telemedicine visit for Covid19: -Onset: started about one week ago; tested positive for covid 2 days ago -she got it from her husband -Symptoms include: fever the first 2 days, cough, fatigue, nasal congestion - she thought this was flu initially -feeling better overall but still feels tired, some nasal congestion, cough and chilled at times -Denies:NVD, CP, SOB, inability to eat/drink/get out of bed -Has tried: OTC cough medication -Pertinent past medical history: see below -Pertinent medication allergies:  Allergies  Allergen Reactions   Other Hives    Blood Pressure Medicine  This was before 2010; she'll discuss with [facility].   Codeine Sulfate     REACTION: nausea  -COVID-19 vaccine status:  Immunization History  Administered Date(s) Administered   PFIZER(Purple Top)SARS-COV-2 Vaccination 07/04/2019, 07/28/2019, 02/28/2020     ROS: See pertinent positives and negatives per HPI.  Past Medical History:  Diagnosis Date   Diabetes mellitus ([place])    A1c 6.7 in 3/18   GERD (gastroesophageal reflux disease) 2007   Goiter    Heart murmur    "my whole life"   Hyperlipidemia    Hypertension    Lumbar degenerative disc disease    S/P ruptured disc, Dr. Lindwood Qua   T3 thyrotoxicosis 2011   S/P RAI   Uterine fibroid    Dr Sabra Heck    Past Surgical History:  Procedure Laterality Date   TUBAL LIGATION  05/14/1979   WISDOM TOOTH EXTRACTION        Current Outpatient Medications:    Ascorbic Acid (VITAMIN C) 100 MG tablet, Take 100 mg by mouth daily., Disp: , Rfl:    Cholecalciferol (VITAMIN D) 125 MCG (5000 UT) CAPS, , Disp: , Rfl:    diltiazem (CARDIZEM CD) 180 MG 24 hr capsule, Take 1 capsule (180 mg total) by mouth daily., Disp: 90 capsule, Rfl: 3   rosuvastatin (CRESTOR) 20 MG tablet, Take 1 tablet (20 mg total) by mouth daily., Disp: 90 tablet, Rfl: 3   spironolactone (ALDACTONE) 25 MG tablet, Take 1 tablet (25 mg total) by mouth 2 (two) times daily., Disp: 180 tablet, Rfl: 3   Zinc 50 MG TABS, , Disp: , Rfl:   EXAM:  VITALS per patient if applicable:  GENERAL: alert, oriented, appears well and in no acute distress  HEENT: atraumatic, conjunttiva clear, no obvious abnormalities on inspection of external nose and ears  NECK: normal movements of the head and neck  LUNGS: on inspection no signs of respiratory distress, breathing rate appears normal, no obvious gross SOB, gasping or wheezing  CV: no obvious cyanosis  MS: moves all visible extremities without noticeable abnormality  PSYCH/NEURO: pleasant and cooperative, no obvious depression or anxiety, speech and thought processing grossly intact  ASSESSMENT AND PLAN:  Discussed the following assessment and plan:  COVID-19  Out of treatment window for antiviral. I am glad she is improving. Discussed staying hydrated, rest, nasal saline, tessalon for cough and isolation and precautions, potential complications, booster and prevention strategies at length.  Work/School slipped offered:  provided in patient instructions  Advised to seek prompt in person care if worsening, new symptoms arise, or if is not improving with treatment. Discussed options for inperson care if PCP office not available. Did let this patient know that I only do telemedicine on Tuesdays and Thursdays for [facility]. Advised to schedule follow up visit with PCP or [facility] if any further questions or  concerns to avoid delays in care.   I discussed the assessment and treatment plan with the patient. The patient was provided an opportunity to ask questions and all were answered. The patient agreed with the plan and demonstrated an understanding of the instructions.     Lucretia Kern, DO

## 2021-04-26 NOTE — Patient Instructions (Addendum)
° ° ° °--------------------------------------------------------------------------------------------------------------------------- ° ° ° °  WORK SLIP:  Patient Kathleen Lucero,  1955/10/07, was seen for a medical visit today, 04/26/21 . Please excuse from work for a COVID/flu like illness. If Covid19 testing is positive advise 10 days minimum from the onset of symptoms (04/19/21) PLUS 1 day of no fever and improved symptoms. Will defer to employer for a sooner return to work if patient has 2 negative covid tests 48 hours apart and is feeling better, or if symptoms have resolved, it is greater than 5 days since the positive test and the patient can wear a high-quality, tight fitting mask such as N95 or KN95 at all times for an additional 5 days. Would also suggest COVID19 antigen testing is negative prior to early return from a Covid illness.  Sincerely: E-signature: Dr. Colin Benton, DO [place] Ph: [phone]   ------------------------------------------------------------------------------------------------------------------------------   HOME CARE TIPS:     -I sent the medication(s) we discussed to your pharmacy: Meds ordered this encounter  Medications   benzonatate (TESSALON PERLES) 100 MG capsule    Sig: 1-2 capsules up to twice daily as needed for cough    Dispense:  40 capsule    Refill:  0     -can use tylenol if needed for fevers, aches and pains per instructions  -can use nasal saline a few times per day if you have nasal congestion; sometimes  a short course of Afrin nasal spray for 3 days can help with symptoms as well  -stay hydrated, drink plenty of fluids and eat small healthy meals - avoid dairy  -can take 1000 IU (51mcg) Vit D3 and 100-500 mg of Vit C daily per instructions   -follow up with your doctor in 2-3 days unless improving and feeling better  -stay home while sick, except to seek medical care. If you have COVID19, you will likely  be contagious for 7-10 days. Flu or Influenza is likely contagious for about 7 days. Other respiratory viral infections remain contagious for 5-10+ days depending on the virus and many other factors. Wear a good mask that fits snugly (such as N95 or KN95) if around others to reduce the risk of transmission.  It was nice to meet you today, and I really hope you are feeling better soon. I help [facility] out with telemedicine visits on Tuesdays and Thursdays and am happy to help if you need a follow up virtual visit on those days. Otherwise, if you have any concerns or questions following this visit please schedule a follow up visit with your Primary Care doctor or seek care at a local urgent care clinic to avoid delays in care.    Seek in person care or schedule a follow up video visit promptly if your symptoms worsen, new concerns arise or you are not improving with treatment. Call 911 and/or seek emergency care if your symptoms are severe or life threatening.

## 2021-05-01 ENCOUNTER — Telehealth: Payer: No Typology Code available for payment source | Admitting: Internal Medicine

## 2021-05-17 NOTE — Progress Notes (Signed)
Virtual Visit via Video Note  I connected with Kathleen Lucero on 05/17/21 at  8:30 AM EST by a video enabled telemedicine application and verified that I am speaking with the correct person using two identifiers.   I discussed the limitations of evaluation and management by telemedicine and the availability of in person appointments. The patient expressed understanding and agreed to proceed.  Present for the visit:  Myself, Dr Billey Gosling, Kathleen Lucero.  The patient is currently at home and I am in the office.    No referring provider.    History of Present Illness: This is an acute visit for health after covid.  She tested pos for covid 12/8.  Had headache, fever, cough.  The first few days were scary - this is the first time she has had covid.  She still has an occasional cough with clear mucus and she is more fatigued.  She wonders when she can get the booster - she did not get that yet.    She is concerned about getting covid again.  She has been so careful - works from home - wears mask when she goes out, has not been out to eat and sprays everything.  She feels like she should not leave her house.  she is not sure what to do.    Taking vit d an zinc.  Does not sleep well since having had covid.  Wakes mid night.  She goes to bed at 8 ish and wakes up at 2ish.    ROS    Social History   Socioeconomic History   Marital status: Married    Spouse name: Not on file   Number of children: 3   Years of education: Not on file   Highest education level: Not on file  Occupational History   Occupation: Best boy: [employer]  Tobacco Use   Smoking status: Never   Smokeless tobacco: Never  Vaping Use   Vaping Use: Never used  Substance and Sexual Activity   Alcohol use: No   Drug use: No   Sexual activity: Yes    Partners: Male    Birth control/protection: Surgical, Post-menopausal    Comment: BTL  Other Topics Concern   Not on file  Social  History Narrative   Walks 2X wkly   3 sons, 6 grandchildren   [place] from [place], [place]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   Social Determinants of Radio broadcast assistant Strain: Not on file  Food Insecurity: Not on file  Transportation Needs: Not on file  Physical Activity: Not on file  Stress: Not on file  Social Connections: Not on file     Observations/Objective: Appears well in NAD Breathing normally  Assessment and Plan:  See Problem List for Assessment and Plan of chronic medical problems.   Follow Up Instructions:    I discussed the assessment and treatment plan with the patient. The patient was provided an opportunity to ask questions and all were answered. The patient agreed with the plan and demonstrated an understanding of the instructions.   The patient was advised to call back or seek an in-person evaluation if the symptoms worsen or if the condition fails to improve as anticipated.    Binnie Rail, MD

## 2021-05-18 ENCOUNTER — Telehealth (INDEPENDENT_AMBULATORY_CARE_PROVIDER_SITE_OTHER): Payer: No Typology Code available for payment source | Admitting: Internal Medicine

## 2021-05-18 ENCOUNTER — Encounter: Payer: Self-pay | Admitting: Internal Medicine

## 2021-05-18 DIAGNOSIS — E559 Vitamin D deficiency, unspecified: Secondary | ICD-10-CM | POA: Diagnosis not present

## 2021-05-18 DIAGNOSIS — Z7189 Other specified counseling: Secondary | ICD-10-CM | POA: Diagnosis not present

## 2021-05-18 DIAGNOSIS — G9332 Myalgic encephalomyelitis/chronic fatigue syndrome: Secondary | ICD-10-CM | POA: Diagnosis not present

## 2021-05-18 DIAGNOSIS — R053 Chronic cough: Secondary | ICD-10-CM

## 2021-05-18 DIAGNOSIS — U099 Post covid-19 condition, unspecified: Secondary | ICD-10-CM

## 2021-05-18 MED ORDER — VITAMIN D3 25 MCG (1000 UT) PO CAPS: 1000.0000 [IU] | ORAL_CAPSULE | Freq: Every day | ORAL | Status: DC

## 2021-05-18 NOTE — Assessment & Plan Note (Signed)
New Had covid 12/22 Has fatigue since having had covid Advised trying to shift sleep cycle to a later bedtime so she can get to a more regular sleep cycle Continue vitamins

## 2021-05-18 NOTE — Assessment & Plan Note (Signed)
She currently has some protection from getting covid again from her recent infection Recommend booster in 3-4 weeks Discussed prevention of getting covid again  Discussed that she needs to leave her house and do things - can not just stay at home Advised having home tests - testing herself when needed for any cold symptoms Discussed anti-viral treatment in the future if needed

## 2021-05-18 NOTE — Assessment & Plan Note (Signed)
Had covid 04/2021 Mild Symptomatic treatment reassured this should improve with more time

## 2021-05-30 LAB — HM MAMMOGRAPHY

## 2021-05-31 ENCOUNTER — Encounter (HOSPITAL_BASED_OUTPATIENT_CLINIC_OR_DEPARTMENT_OTHER): Payer: Self-pay | Admitting: *Deleted

## 2021-06-14 ENCOUNTER — Encounter: Payer: Self-pay | Admitting: Internal Medicine

## 2021-06-14 NOTE — Progress Notes (Signed)
Outside notes received. Information abstracted. Notes sent to scan.  

## 2021-06-29 ENCOUNTER — Ambulatory Visit: Payer: No Typology Code available for payment source | Admitting: Internal Medicine

## 2021-07-22 ENCOUNTER — Encounter: Payer: Self-pay | Admitting: Internal Medicine

## 2021-07-22 NOTE — Patient Instructions (Addendum)
° ° ° °  Blood work was ordered.     Medications changes include :      Your prescription(s) have been sent to your pharmacy.    A referral was ordered for XX.     Someone from that office will call you to schedule an appointment.    Return in about 6 months (around 01/25/2022) for CPE.

## 2021-07-22 NOTE — Progress Notes (Unsigned)
° ° ° ° °  Subjective:    Patient ID: Kathleen Lucero, female    DOB: Sep 14, 1955, 66 y.o.   MRN: [id number]  This visit occurred during the SARS-CoV-2 public health emergency.  Safety protocols were in place, including screening questions prior to the visit, additional usage of staff PPE, and extensive cleaning of exam room while observing appropriate contact time as indicated for disinfecting solutions.     HPI Jasper is here for follow up of her chronic medical problems, including htn, DM, hld    Medications and allergies reviewed with patient and updated if appropriate.  Current Outpatient Medications on File Prior to Visit  Medication Sig Dispense Refill   Ascorbic Acid (VITAMIN C) 100 MG tablet Take 100 mg by mouth daily.     Cholecalciferol (VITAMIN D3) 25 MCG (1000 UT) CAPS Take 1 capsule (1,000 Units total) by mouth daily. 60 capsule    diltiazem (CARDIZEM CD) 180 MG 24 hr capsule Take 1 capsule (180 mg total) by mouth daily. 90 capsule 3   rosuvastatin (CRESTOR) 20 MG tablet Take 1 tablet (20 mg total) by mouth daily. 90 tablet 3   spironolactone (ALDACTONE) 25 MG tablet Take 1 tablet (25 mg total) by mouth 2 (two) times daily. 180 tablet 3   Zinc 50 MG TABS      No current facility-administered medications on file prior to visit.     Review of Systems     Objective:  There were no vitals filed for this visit. BP Readings from Last 3 Encounters:  02/09/21 (!) 141/68  01/22/21 (!) 149/78  12/22/20 140/78   Wt Readings from Last 3 Encounters:  02/09/21 236 lb (107 kg)  01/22/21 236 lb 3.2 oz (107.1 kg)  12/22/20 240 lb (108.9 kg)   There is no height or weight on file to calculate BMI.    Physical Exam     Lab Results  Component Value Date   WBC 10.8 01/22/2021   HGB 15.0 01/22/2021   HCT 45.5 01/22/2021   PLT 240 01/22/2021   GLUCOSE 129 (H) 01/22/2021   CHOL 183 12/22/2020   TRIG 112.0 12/22/2020   HDL 50.70 12/22/2020   LDLDIRECT 130.2 11/05/2006    LDLCALC 109 (H) 12/22/2020   ALT 16 01/22/2021   AST 13 01/22/2021   NA 138 01/22/2021   K 4.6 01/22/2021   CL 99 01/22/2021   CREATININE 1.10 (H) 01/22/2021   BUN 15 01/22/2021   CO2 22 01/22/2021   TSH 2.30 12/22/2020   HGBA1C 6.9 (H) 12/22/2020   MICROALBUR 0.7 12/22/2020     Assessment & Plan:    See Problem List for Assessment and Plan of chronic medical problems.

## 2021-07-25 ENCOUNTER — Other Ambulatory Visit: Payer: Self-pay

## 2021-07-25 ENCOUNTER — Ambulatory Visit: Payer: No Typology Code available for payment source | Admitting: Internal Medicine

## 2021-07-25 VITALS — BP 140/82 | HR 68 | Temp 98.2°F | Ht 67.0 in | Wt 242.0 lb

## 2021-07-25 DIAGNOSIS — E119 Type 2 diabetes mellitus without complications: Secondary | ICD-10-CM | POA: Diagnosis not present

## 2021-07-25 DIAGNOSIS — E7849 Other hyperlipidemia: Secondary | ICD-10-CM | POA: Diagnosis not present

## 2021-07-25 DIAGNOSIS — I10 Essential (primary) hypertension: Secondary | ICD-10-CM

## 2021-07-25 LAB — LIPID PANEL
Cholesterol: 172 mg/dL (ref 0–200)
HDL: 48.1 mg/dL (ref >39.00)
LDL Cholesterol: 101 mg/dL — ABNORMAL HIGH (ref 0–99)
NonHDL: 124.17
Total CHOL/HDL Ratio: 4
Triglycerides: 117 mg/dL (ref 0.0–149.0)
VLDL: 23.4 mg/dL (ref 0.0–40.0)

## 2021-07-25 LAB — COMPREHENSIVE METABOLIC PANEL
ALT: 20 U/L (ref 0–35)
AST: 17 U/L (ref 0–37)
Albumin: 4.7 g/dL (ref 3.5–5.2)
Alkaline Phosphatase: 104 U/L (ref 39–117)
BUN: 16 mg/dL (ref 6–23)
CO2: 27 mEq/L (ref 19–32)
Calcium: 10.4 mg/dL (ref 8.4–10.5)
Chloride: 104 mEq/L (ref 96–112)
Creatinine, Ser: 1.1 mg/dL (ref 0.40–1.20)
GFR: 53.08 mL/min — ABNORMAL LOW (ref >60.00)
Glucose, Bld: 137 mg/dL — ABNORMAL HIGH (ref 70–99)
Potassium: 4 mEq/L (ref 3.5–5.1)
Sodium: 138 mEq/L (ref 135–145)
Total Bilirubin: 0.7 mg/dL (ref 0.2–1.2)
Total Protein: 7.6 g/dL (ref 6.0–8.3)

## 2021-07-25 LAB — HEMOGLOBIN A1C: Hgb A1c MFr Bld: 7.1 % — ABNORMAL HIGH (ref 4.6–6.5)

## 2021-07-25 MED ORDER — VALSARTAN 80 MG PO TABS: 80.0000 mg | ORAL_TABLET | Freq: Every day | ORAL | 5 refills | Status: DC

## 2021-07-25 MED ORDER — VALSARTAN-HYDROCHLOROTHIAZIDE 80-12.5 MG PO TABS: 1.0000 | ORAL_TABLET | Freq: Every day | ORAL | 5 refills | Status: DC

## 2021-07-25 NOTE — Assessment & Plan Note (Signed)
Chronic ?Lab Results  ?Component Value Date  ? HGBA1C 6.9 (H) 12/22/2020  ? ?Sugars  controlled ?Check A1c  ?Continue diet control-discussed that she can start medication if she would like to make sure her sugars are well controlled, which may also help with her weight ?Stressed regular exercise, diabetic diet ? ? ?

## 2021-07-25 NOTE — Assessment & Plan Note (Signed)
Chronic ?Regular exercise and healthy diet encouraged ?Check lipid panel  ?Continue Rosuvastatin 20 mg daily ?

## 2021-07-25 NOTE — Assessment & Plan Note (Addendum)
Chronic ?Blood pressure not controlled ?CMP ?Continue diltiazem 180 mg daily, spironolactone 25 mg twice daily ?Add diovan-hct 80-12.5 mg daily ?F/u in 2 months ?

## 2021-09-26 ENCOUNTER — Ambulatory Visit: Payer: No Typology Code available for payment source | Admitting: Internal Medicine

## 2021-10-10 NOTE — Patient Instructions (Addendum)
     Blood work was ordered.     Medications changes include :  ozempic 0.25 mg once a week x 4 weeks, then 0.5 mg weekly    Your prescription(s) have been sent to your pharmacy.      Return in about 6 months (around 04/12/2022) for Physical Exam.

## 2021-10-10 NOTE — Progress Notes (Unsigned)
Subjective:    Patient ID: Kathleen Lucero, female    DOB: 08-16-1955, 66 y.o.   MRN: [id number]     HPI Kathleen Lucero is here for follow up of her chronic medical problems, including DM, htn, hld  Colonoscopy due-Dr. Collene Lucero - advised her to call to set up.  She is concerned about weight.  Her and her husband have been walking - at a slower pace. She is not eating good and knows she needs to change that.  She wants to lose weight and wonders what she can do.    Medications and allergies reviewed with patient and updated if appropriate.  Current Outpatient Medications on File Prior to Visit  Medication Sig Dispense Refill   Ascorbic Acid (VITAMIN C) 100 MG tablet Take 100 mg by mouth daily.     Cholecalciferol (VITAMIN D3) 25 MCG (1000 UT) CAPS Take 1 capsule (1,000 Units total) by mouth daily. 60 capsule    diltiazem (CARDIZEM CD) 180 MG 24 hr capsule Take 1 capsule (180 mg total) by mouth daily. 90 capsule 3   rosuvastatin (CRESTOR) 20 MG tablet Take 1 tablet (20 mg total) by mouth daily. 90 tablet 3   spironolactone (ALDACTONE) 25 MG tablet Take 1 tablet (25 mg total) by mouth 2 (two) times daily. 180 tablet 3   valsartan-hydrochlorothiazide (DIOVAN-HCT) 80-12.5 MG tablet Take 1 tablet by mouth daily. 30 tablet 5   Zinc 50 MG TABS      No current facility-administered medications on file prior to visit.     Review of Systems  Constitutional:  Negative for chills and fever.  Respiratory:  Negative for cough, shortness of breath and wheezing.   Cardiovascular:  Negative for chest pain, palpitations and leg swelling.  Neurological:  Negative for light-headedness and headaches.      Objective:   Vitals:   10/11/21 1015  BP: 126/78  Pulse: 76  Temp: 98.2 F (36.8 C)  SpO2: 97%   BP Readings from Last 3 Encounters:  10/11/21 126/78  07/25/21 140/82  02/09/21 (!) 141/68   Wt Readings from Last 3 Encounters:  10/11/21 238 lb (108 kg)  07/25/21 242 lb (109.8 kg)   02/09/21 236 lb (107 kg)   Body mass index is 37.28 kg/m.    Physical Exam Constitutional:      General: She is not in acute distress.    Appearance: Normal appearance.  HENT:     Head: Normocephalic and atraumatic.  Eyes:     Conjunctiva/sclera: Conjunctivae normal.  Cardiovascular:     Rate and Rhythm: Normal rate and regular rhythm.     Heart sounds: Murmur (2/6 systolic) heard.  Pulmonary:     Effort: Pulmonary effort is normal. No respiratory distress.     Breath sounds: Normal breath sounds. No wheezing.  Musculoskeletal:     Cervical back: Neck supple.     Right lower leg: No edema.     Left lower leg: No edema.  Lymphadenopathy:     Cervical: No cervical adenopathy.  Skin:    General: Skin is warm and dry.     Findings: No rash.  Neurological:     Mental Status: She is alert. Mental status is at baseline.  Psychiatric:        Mood and Affect: Mood normal.        Behavior: Behavior normal.       Diabetic Foot Exam - Simple   Simple Foot Form Diabetic Foot exam was performed  with the following findings: Yes 10/11/2021 10:50 AM  Visual Inspection No deformities, no ulcerations, no other skin breakdown bilaterally: Yes Sensation Testing Intact to touch and monofilament testing bilaterally: Yes Pulse Check Posterior Tibialis and Dorsalis pulse intact bilaterally: Yes Comments      Lab Results  Component Value Date   WBC 10.8 01/22/2021   HGB 15.0 01/22/2021   HCT 45.5 01/22/2021   PLT 240 01/22/2021   GLUCOSE 137 (H) 07/25/2021   CHOL 172 07/25/2021   TRIG 117.0 07/25/2021   HDL 48.10 07/25/2021   LDLDIRECT 130.2 11/05/2006   LDLCALC 101 (H) 07/25/2021   ALT 20 07/25/2021   AST 17 07/25/2021   NA 138 07/25/2021   K 4.0 07/25/2021   CL 104 07/25/2021   CREATININE 1.10 07/25/2021   BUN 16 07/25/2021   CO2 27 07/25/2021   TSH 2.30 12/22/2020   HGBA1C 7.1 (H) 07/25/2021   MICROALBUR 0.7 12/22/2020     Assessment & Plan:    See Problem List  for Assessment and Plan of chronic medical problems.

## 2021-10-11 ENCOUNTER — Encounter: Payer: Self-pay | Admitting: Internal Medicine

## 2021-10-11 ENCOUNTER — Ambulatory Visit: Payer: No Typology Code available for payment source | Admitting: Internal Medicine

## 2021-10-11 VITALS — BP 126/78 | HR 76 | Temp 98.2°F | Ht 67.0 in | Wt 238.0 lb

## 2021-10-11 DIAGNOSIS — E7849 Other hyperlipidemia: Secondary | ICD-10-CM | POA: Diagnosis not present

## 2021-10-11 DIAGNOSIS — E119 Type 2 diabetes mellitus without complications: Secondary | ICD-10-CM

## 2021-10-11 DIAGNOSIS — Q231 Congenital insufficiency of aortic valve: Secondary | ICD-10-CM

## 2021-10-11 DIAGNOSIS — I340 Nonrheumatic mitral (valve) insufficiency: Secondary | ICD-10-CM

## 2021-10-11 DIAGNOSIS — I10 Essential (primary) hypertension: Secondary | ICD-10-CM

## 2021-10-11 LAB — CBC WITH DIFFERENTIAL/PLATELET
Basophils Absolute: 0.1 10*3/uL (ref 0.0–0.1)
Basophils Relative: 1 % (ref 0.0–3.0)
Eosinophils Absolute: 0.2 10*3/uL (ref 0.0–0.7)
Eosinophils Relative: 3.7 % (ref 0.0–5.0)
HCT: 41.1 % (ref 36.0–46.0)
Hemoglobin: 13.3 g/dL (ref 12.0–15.0)
Lymphocytes Relative: 33.3 % (ref 12.0–46.0)
Lymphs Abs: 2.2 10*3/uL (ref 0.7–4.0)
MCHC: 32.3 g/dL (ref 30.0–36.0)
MCV: 83.9 fl (ref 78.0–100.0)
Monocytes Absolute: 0.6 10*3/uL (ref 0.1–1.0)
Monocytes Relative: 8.6 % (ref 3.0–12.0)
Neutro Abs: 3.5 10*3/uL (ref 1.4–7.7)
Neutrophils Relative %: 53.4 % (ref 43.0–77.0)
Platelets: 197 10*3/uL (ref 150.0–400.0)
RBC: 4.9 Mil/uL (ref 3.87–5.11)
RDW: 14 % (ref 11.5–15.5)
WBC: 6.5 10*3/uL (ref 4.0–10.5)

## 2021-10-11 LAB — LIPID PANEL
Cholesterol: 171 mg/dL (ref 0–200)
HDL: 47.4 mg/dL (ref >39.00)
LDL Cholesterol: 101 mg/dL — ABNORMAL HIGH (ref 0–99)
NonHDL: 123.23
Total CHOL/HDL Ratio: 4
Triglycerides: 112 mg/dL (ref 0.0–149.0)
VLDL: 22.4 mg/dL (ref 0.0–40.0)

## 2021-10-11 LAB — COMPREHENSIVE METABOLIC PANEL
ALT: 17 U/L (ref 0–35)
AST: 20 U/L (ref 0–37)
Albumin: 4.8 g/dL (ref 3.5–5.2)
Alkaline Phosphatase: 90 U/L (ref 39–117)
BUN: 27 mg/dL — ABNORMAL HIGH (ref 6–23)
CO2: 25 mEq/L (ref 19–32)
Calcium: 10.5 mg/dL (ref 8.4–10.5)
Chloride: 102 mEq/L (ref 96–112)
Creatinine, Ser: 1.57 mg/dL — ABNORMAL HIGH (ref 0.40–1.20)
GFR: 34.59 mL/min — ABNORMAL LOW (ref >60.00)
Glucose, Bld: 113 mg/dL — ABNORMAL HIGH (ref 70–99)
Potassium: 3.9 mEq/L (ref 3.5–5.1)
Sodium: 137 mEq/L (ref 135–145)
Total Bilirubin: 0.7 mg/dL (ref 0.2–1.2)
Total Protein: 8.1 g/dL (ref 6.0–8.3)

## 2021-10-11 LAB — HEMOGLOBIN A1C: Hgb A1c MFr Bld: 7.1 % — ABNORMAL HIGH (ref 4.6–6.5)

## 2021-10-11 MED ORDER — BLOOD GLUCOSE MONITOR KIT: PACK | 0 refills | Status: DC

## 2021-10-11 MED ORDER — SPIRONOLACTONE 25 MG PO TABS
25.0000 mg | ORAL_TABLET | Freq: Every day | ORAL | 3 refills | Status: DC
Start: 2021-10-11 — End: 2022-03-06

## 2021-10-11 MED ORDER — OZEMPIC (0.25 OR 0.5 MG/DOSE) 2 MG/1.5ML ~~LOC~~ SOPN: PEN_INJECTOR | SUBCUTANEOUS | 0 refills | Status: DC

## 2021-10-11 NOTE — Assessment & Plan Note (Signed)
Chronic Asymptomatic  Echo ordered to evaluate MR and bicuspid valve

## 2021-10-11 NOTE — Addendum Note (Signed)
Addended by: Binnie Rail on: 10/11/2021 03:02 PM   Modules accepted: Orders

## 2021-10-11 NOTE — Assessment & Plan Note (Signed)
Chronic Regular exercise and healthy diet encouraged Check lipid panel  Continue Crestor 20 mg daily 

## 2021-10-11 NOTE — Assessment & Plan Note (Addendum)
Chronic  Lab Results  Component Value Date   HGBA1C 7.1 (H) 07/25/2021   Sugars not ideally controlled Check A1c Continue lifestyle control.  Discussed starting medication if her sugars are not better controlled and help with weight loss Start ozempic 0.25 mg weekly x 4 weeks, then 0.5 mg weekly If not covered consider rybelsus or mounjaro Stressed regular exercise, diabetic diet

## 2021-10-11 NOTE — Assessment & Plan Note (Signed)
Chronic Blood pressure well controlled CMP Continue diltiazem 180 mg daily, spironolactone 25 mg twice daily, Diovan-HCT 80-12.5 mg daily

## 2021-10-22 ENCOUNTER — Telehealth (HOSPITAL_BASED_OUTPATIENT_CLINIC_OR_DEPARTMENT_OTHER): Payer: Self-pay | Admitting: *Deleted

## 2021-10-22 NOTE — Telephone Encounter (Signed)
Lm 10/12/21--10/15/21--10/17/21 and 10/22/21 for patient to call and schedule the Echocardiogram ordered by Dr. Billey Gosling

## 2021-10-24 NOTE — Telephone Encounter (Signed)
Left message for patient to call and schedule the Echocardiogram ordered by Dr. Billey Gosling

## 2021-10-31 ENCOUNTER — Encounter (HOSPITAL_BASED_OUTPATIENT_CLINIC_OR_DEPARTMENT_OTHER): Payer: Self-pay | Admitting: *Deleted

## 2021-10-31 NOTE — Telephone Encounter (Signed)
Unable to reach patient by phone to schedule---will mail letter requesting she call

## 2021-11-30 ENCOUNTER — Telehealth (HOSPITAL_BASED_OUTPATIENT_CLINIC_OR_DEPARTMENT_OTHER): Payer: Self-pay | Admitting: *Deleted

## 2021-11-30 NOTE — Telephone Encounter (Signed)
Patient was called 10/12/21--10/15/21--10/17/21--10/22/21 and 10/22/21 to discuss scheduling the Echocardiogram ordered by Dr. Ricardo Jericho mailed 10/31/21 requesting patient call and schedule.  Patient has not responded . At this time, we will cancel the Echocardiogram order.

## 2021-11-30 NOTE — Telephone Encounter (Signed)
Noted - will discuss with her at her upcoming appt.

## 2021-12-03 ENCOUNTER — Encounter: Payer: Self-pay | Admitting: Internal Medicine

## 2021-12-03 DIAGNOSIS — N183 Chronic kidney disease, stage 3 unspecified: Secondary | ICD-10-CM | POA: Insufficient documentation

## 2021-12-03 NOTE — Patient Instructions (Addendum)
     Blood work was ordered.     Medications changes include :   change cardizem from 180 mg to 240 mg daily         Stop diovan-hct and start plain diovan    Return in about 3 months (around 03/06/2022) for follow up.

## 2021-12-03 NOTE — Progress Notes (Unsigned)
      Subjective:    Patient ID: Kathleen Lucero, female    DOB: Jun 21, 1955, 66 y.o.   MRN: [id number]     HPI Kathleen Lucero is here for follow up of her chronic medical problems, including decreased kidney function.   Dec GFR significantly after addition of diovan - hct ( already on dilt and spironlactone  ? On ozempic  Medications and allergies reviewed with patient and updated if appropriate.  Current Outpatient Medications on File Prior to Visit  Medication Sig Dispense Refill   Ascorbic Acid (VITAMIN C) 100 MG tablet Take 100 mg by mouth daily.     blood glucose meter kit and supplies KIT Dispense based on patient and insurance preference. Use up to four times daily as directed. (FOR E11.9). 1 each 0   Cholecalciferol (VITAMIN D3) 25 MCG (1000 UT) CAPS Take 1 capsule (1,000 Units total) by mouth daily. 60 capsule    diltiazem (CARDIZEM CD) 180 MG 24 hr capsule Take 1 capsule (180 mg total) by mouth daily. 90 capsule 3   rosuvastatin (CRESTOR) 20 MG tablet Take 1 tablet (20 mg total) by mouth daily. 90 tablet 3   Semaglutide,0.25 or 0.5MG /DOS, (OZEMPIC, 0.25 OR 0.5 MG/DOSE,) 2 MG/1.5ML SOPN Inject 0.25 mg into the skin once a week for 30 days, THEN 0.5 mg once a week. 2 mL 0   spironolactone (ALDACTONE) 25 MG tablet Take 1 tablet (25 mg total) by mouth daily. 90 tablet 3   valsartan-hydrochlorothiazide (DIOVAN-HCT) 80-12.5 MG tablet Take 1 tablet by mouth daily. 30 tablet 5   Zinc 50 MG TABS      No current facility-administered medications on file prior to visit.     Review of Systems     Objective:  There were no vitals filed for this visit. BP Readings from Last 3 Encounters:  10/11/21 126/78  07/25/21 140/82  02/09/21 (!) 141/68   Wt Readings from Last 3 Encounters:  10/11/21 238 lb (108 kg)  07/25/21 242 lb (109.8 kg)  02/09/21 236 lb (107 kg)   There is no height or weight on file to calculate BMI.    Physical Exam     Lab Results  Component Value Date    WBC 6.5 10/11/2021   HGB 13.3 10/11/2021   HCT 41.1 10/11/2021   PLT 197.0 10/11/2021   GLUCOSE 113 (H) 10/11/2021   CHOL 171 10/11/2021   TRIG 112.0 10/11/2021   HDL 47.40 10/11/2021   LDLDIRECT 130.2 11/05/2006   LDLCALC 101 (H) 10/11/2021   ALT 17 10/11/2021   AST 20 10/11/2021   NA 137 10/11/2021   K 3.9 10/11/2021   CL 102 10/11/2021   CREATININE 1.57 (H) 10/11/2021   BUN 27 (H) 10/11/2021   CO2 25 10/11/2021   TSH 2.30 12/22/2020   HGBA1C 7.1 (H) 10/11/2021   MICROALBUR 0.7 12/22/2020     Assessment & Plan:    See Problem List for Assessment and Plan of chronic medical problems.

## 2021-12-04 ENCOUNTER — Ambulatory Visit: Payer: No Typology Code available for payment source | Admitting: Internal Medicine

## 2021-12-04 VITALS — BP 134/80 | HR 70 | Temp 98.3°F | Ht 67.0 in | Wt 240.0 lb

## 2021-12-04 DIAGNOSIS — I340 Nonrheumatic mitral (valve) insufficiency: Secondary | ICD-10-CM | POA: Diagnosis not present

## 2021-12-04 DIAGNOSIS — E119 Type 2 diabetes mellitus without complications: Secondary | ICD-10-CM

## 2021-12-04 DIAGNOSIS — N1831 Chronic kidney disease, stage 3a: Secondary | ICD-10-CM | POA: Diagnosis not present

## 2021-12-04 DIAGNOSIS — I10 Essential (primary) hypertension: Secondary | ICD-10-CM | POA: Diagnosis not present

## 2021-12-04 DIAGNOSIS — Q231 Congenital insufficiency of aortic valve: Secondary | ICD-10-CM

## 2021-12-04 LAB — BASIC METABOLIC PANEL
BUN: 14 mg/dL (ref 6–23)
CO2: 26 mEq/L (ref 19–32)
Calcium: 10.3 mg/dL (ref 8.4–10.5)
Chloride: 104 mEq/L (ref 96–112)
Creatinine, Ser: 1.15 mg/dL (ref 0.40–1.20)
GFR: 50.2 mL/min — ABNORMAL LOW (ref >60.00)
Glucose, Bld: 108 mg/dL — ABNORMAL HIGH (ref 70–99)
Potassium: 3.9 mEq/L (ref 3.5–5.1)
Sodium: 138 mEq/L (ref 135–145)

## 2021-12-04 LAB — HEMOGLOBIN A1C: Hgb A1c MFr Bld: 6.6 % — ABNORMAL HIGH (ref 4.6–6.5)

## 2021-12-04 MED ORDER — VALSARTAN 80 MG PO TABS: 80.0000 mg | ORAL_TABLET | Freq: Every day | ORAL | 5 refills | Status: DC

## 2021-12-04 MED ORDER — OZEMPIC (0.25 OR 0.5 MG/DOSE) 2 MG/1.5ML ~~LOC~~ SOPN: PEN_INJECTOR | SUBCUTANEOUS | 0 refills | Status: DC

## 2021-12-04 MED ORDER — DILTIAZEM HCL ER COATED BEADS 240 MG PO CP24: 240.0000 mg | ORAL_CAPSULE | Freq: Every day | ORAL | 5 refills | Status: DC

## 2021-12-04 NOTE — Assessment & Plan Note (Signed)
Due for echo-ordered Asymptomatic

## 2021-12-04 NOTE — Assessment & Plan Note (Signed)
Chronic Not ideally controlled Encourage regular exercise-most likely that will not happen until she retires-the end of next month Encouraged diabetic diet Knows she needs to work on weight loss Start Ozempic 0.25 mg weekly x4 weeks then 0.5 mg weekly-hopefully she can get this at the pharmacy at this time

## 2021-12-04 NOTE — Assessment & Plan Note (Signed)
Chronic Asymptomatic Due for echo-ordered

## 2021-12-04 NOTE — Assessment & Plan Note (Signed)
Chronic Blood pressure much improved Addition of Diovan-HCT did result in decreased GFR We will go ahead and change medication before checking BMP today, but will check BMP today and again next week since we are making a change now Increase diltiazem to 240 mg daily Continue spironolactone 25 mg daily Change Diovan HCT to Diovan 80 mg daily Discontinue hydrochlorothiazide

## 2021-12-17 ENCOUNTER — Other Ambulatory Visit (INDEPENDENT_AMBULATORY_CARE_PROVIDER_SITE_OTHER): Payer: No Typology Code available for payment source

## 2021-12-17 DIAGNOSIS — N1831 Chronic kidney disease, stage 3a: Secondary | ICD-10-CM

## 2021-12-17 DIAGNOSIS — I10 Essential (primary) hypertension: Secondary | ICD-10-CM | POA: Diagnosis not present

## 2021-12-17 LAB — BASIC METABOLIC PANEL
BUN: 20 mg/dL (ref 6–23)
CO2: 26 mEq/L (ref 19–32)
Calcium: 10.1 mg/dL (ref 8.4–10.5)
Chloride: 102 mEq/L (ref 96–112)
Creatinine, Ser: 1.18 mg/dL (ref 0.40–1.20)
GFR: 48.66 mL/min — ABNORMAL LOW (ref >60.00)
Glucose, Bld: 99 mg/dL (ref 70–99)
Potassium: 3.7 mEq/L (ref 3.5–5.1)
Sodium: 143 mEq/L (ref 135–145)

## 2021-12-28 ENCOUNTER — Other Ambulatory Visit: Payer: Self-pay | Admitting: Internal Medicine

## 2022-01-04 LAB — HM DIABETES EYE EXAM

## 2022-01-07 ENCOUNTER — Encounter (HOSPITAL_COMMUNITY): Payer: Self-pay | Admitting: Internal Medicine

## 2022-01-10 ENCOUNTER — Encounter: Payer: Self-pay | Admitting: Internal Medicine

## 2022-01-10 NOTE — Progress Notes (Signed)
Outside notes received. Information abstracted. Notes sent to scan.  

## 2022-02-14 ENCOUNTER — Encounter: Payer: Self-pay | Admitting: Internal Medicine

## 2022-02-14 ENCOUNTER — Telehealth: Payer: Self-pay | Admitting: *Deleted

## 2022-02-14 ENCOUNTER — Ambulatory Visit (HOSPITAL_COMMUNITY): Payer: Medicare Other | Attending: Internal Medicine

## 2022-02-14 DIAGNOSIS — Q231 Congenital insufficiency of aortic valve: Secondary | ICD-10-CM | POA: Diagnosis not present

## 2022-02-14 DIAGNOSIS — I340 Nonrheumatic mitral (valve) insufficiency: Secondary | ICD-10-CM | POA: Insufficient documentation

## 2022-02-14 LAB — ECHOCARDIOGRAM COMPLETE
AR max vel: 1.69 cm2
AV Area VTI: 1.75 cm2
AV Area mean vel: 1.64 cm2
AV Mean grad: 16 mmHg
AV Peak grad: 27.9 mmHg
Ao pk vel: 2.64 m/s
Area-P 1/2: 4.49 cm2
MV M vel: 5.37 m/s
MV Peak grad: 115.3 mmHg
Radius: 0.8 cm
S' Lateral: 3.6 cm

## 2022-02-14 NOTE — Telephone Encounter (Signed)
-----   Message from Binnie Rail, MD sent at 02/14/2022  2:56 PM EDT ----- Yes, thank you.   Stacy  ----- Message ----- From: Nuala Alpha, LPN Sent: [id number]   2:29 PM EDT To: Binnie Rail, MD; Freada Bergeron, MD  Dr. Quay Burow, I tried calling her on both contact numbers to give her an appt to see Dr. Johney Frame on 02/26/22 at 10 am for further echo discussion/TEE set-up/imaging.  I left her a message to call our office back so we can schedule this appt.  I am off tomorrow and next Monday, so in the case you talk to her before then, can you please tell her we can see her here at our [facility] location (where Dr. Tamala Julian saw her at a few yrs ago) on 02/26/22 at 10 am?  She will be new to establish with Dr. Johney Frame, for her former Cardiologist Dr. Tamala Julian will be in retirement starting in Jan 2024. Thanks so much and have a great day!  Thanks, Karlene Einstein    ----- Message ----- From: Freada Bergeron, MD Sent: 02/14/2022   1:27 PM EDT To: Nuala Alpha, LPN; Binnie Rail, MD  Just a heads up that her mitral regurgitation is severe. She fortunately does not have a bicuspid aortic valve. She needs a TEE and a CT scan to better evaluate her LV morphology (wonder if she only has one papillary muscle).   Would you like [place] to arrange to have her seen and set up for imaging?  Hope you have a good day!  Sincerely,  Gwyndolyn Kaufman

## 2022-02-14 NOTE — Telephone Encounter (Signed)
Scheduling dept  tried calling the pt to arrange new pt appt with Dr. Johney Frame on 10/17 at 10 am and she did not answer.  They did leave her a message to call the office back to confirm and arrange this appt.  This is in regards to abnormal echo ordered by PCP Dr. Quay Burow and read by Dr. Johney Frame.     Staff message sent to PCP that we tried calling the pt to schedule new pt appt with Dr. Johney Frame on 02/26/22 at 10 am end slot.  Dr. Quay Burow confirmed she will make the pt aware of appt date and time when they call her back with her echo results.   Scheduling will continue to also follow-up with the pt about appt date and time.

## 2022-02-15 ENCOUNTER — Ambulatory Visit (INDEPENDENT_AMBULATORY_CARE_PROVIDER_SITE_OTHER): Payer: Medicare Other | Admitting: Obstetrics & Gynecology

## 2022-02-15 ENCOUNTER — Encounter (HOSPITAL_BASED_OUTPATIENT_CLINIC_OR_DEPARTMENT_OTHER): Payer: Self-pay | Admitting: Obstetrics & Gynecology

## 2022-02-15 ENCOUNTER — Other Ambulatory Visit (HOSPITAL_COMMUNITY)
Admission: RE | Admit: 2022-02-15 | Discharge: 2022-02-15 | Disposition: A | Discharge status: Home / Self Care | Payer: Medicare Other | Source: Ambulatory Visit | Attending: Obstetrics & Gynecology | Admitting: Obstetrics & Gynecology

## 2022-02-15 VITALS — BP 155/76 | HR 76 | Ht 67.0 in | Wt 215.4 lb

## 2022-02-15 DIAGNOSIS — Z124 Encounter for screening for malignant neoplasm of cervix: Secondary | ICD-10-CM

## 2022-02-15 DIAGNOSIS — Z01419 Encounter for gynecological examination (general) (routine) without abnormal findings: Secondary | ICD-10-CM

## 2022-02-15 DIAGNOSIS — E2839 Other primary ovarian failure: Secondary | ICD-10-CM | POA: Diagnosis not present

## 2022-02-15 DIAGNOSIS — D251 Intramural leiomyoma of uterus: Secondary | ICD-10-CM | POA: Diagnosis not present

## 2022-02-15 DIAGNOSIS — I10 Essential (primary) hypertension: Secondary | ICD-10-CM

## 2022-02-15 DIAGNOSIS — N1831 Chronic kidney disease, stage 3a: Secondary | ICD-10-CM

## 2022-02-15 NOTE — Progress Notes (Signed)
66 y.o. [id number] Married [place] American female here for breast and pelvic exam.  She did retire this past year.  Has lost weight.  Was prescribed ozempic but never took it.  Long hx of hypertension.  Has some renal dysfunction now.  Walking regularly.    Denies vaginal bleeding.  Had echo yesterday.  Worsening mitral valve.  Will have appt with Dr. Johney Frame.  Patient's last menstrual period was 05/14/2007.          Sexually active: Yes.    H/O STD:  no  Health Maintenance: PCP:  Dr. Quay Burow.  Last wellness appt was 10/2021.  Did blood work at that appt:  yes Vaccines are up to date:  declines flu shot, pneumonia vaccination Colonoscopy:  09/30/2016, five year follow up recommended.   MMG:  05/30/2021 Negative BMD:  ordered Last pap smear:  07/23/2019 Negative.   H/o abnormal pap smear:  no    reports that she has never smoked. She has never used smokeless tobacco. She reports that she does not drink alcohol and does not use drugs.  Past Medical History:  Diagnosis Date   Diabetes mellitus ([place])    A1c 6.7 in 3/18   GERD (gastroesophageal reflux disease) 2007   Goiter    Heart murmur    "my whole life"   Hyperlipidemia    Hypertension    Lumbar degenerative disc disease    S/P ruptured disc, Dr. Lindwood Qua   T3 thyrotoxicosis 2011   S/P RAI   Uterine fibroid    Dr Sabra Heck    Past Surgical History:  Procedure Laterality Date   TUBAL LIGATION  05/14/1979   WISDOM TOOTH EXTRACTION      Current Outpatient Medications  Medication Sig Dispense Refill   ACCU-CHEK GUIDE test strip 4 (four) times daily. use as directed     Accu-Chek Softclix Lancets lancets 4 (four) times daily.     Ascorbic Acid (VITAMIN C) 100 MG tablet Take 100 mg by mouth daily.     blood glucose meter kit and supplies KIT Dispense based on patient and insurance preference. Use up to four times daily as directed. (FOR E11.9). 1 each 0   Cholecalciferol (VITAMIN D3) 25 MCG (1000 UT) CAPS Take 1  capsule (1,000 Units total) by mouth daily. 60 capsule    diltiazem (CARDIZEM CD) 180 MG 24 hr capsule TAKE 1 CAPSULE BY MOUTH EVERY DAY 90 capsule 1   diltiazem (CARTIA XT) 240 MG 24 hr capsule Take 1 capsule (240 mg total) by mouth daily. 30 capsule 5   rosuvastatin (CRESTOR) 20 MG tablet TAKE 1 TABLET BY MOUTH EVERY DAY 90 tablet 3   Semaglutide,0.25 or 0.5MG/DOS, (OZEMPIC, 0.25 OR 0.5 MG/DOSE,) 2 MG/1.5ML SOPN Inject 0.25 mg into the skin once a week for 30 days, THEN 0.5 mg once a week. 2 mL 0   spironolactone (ALDACTONE) 25 MG tablet Take 1 tablet (25 mg total) by mouth daily. 90 tablet 3   Zinc 50 MG TABS      No current facility-administered medications for this visit.    Family History  Problem Relation Age of Onset   Hypertension Mother    Stroke Mother 67       5 days post partum   Alzheimer's disease Mother    Hypertension Brother         2 brothers   Cancer Brother 62       colorectal   Hypertension Maternal Grandmother    Thyroid  disease Maternal Grandmother        hypothyroidism   Diabetes Maternal Grandmother        diet controlled   Dementia Maternal Grandmother 62   Alzheimer's disease Maternal Grandmother    Dementia Paternal Aunt    Heart attack Neg Hx     Review of Systems  Constitutional: Negative.   Genitourinary: Negative.     Exam:   BP (!) 155/76 (BP Location: Left Arm, Patient Position: Sitting, Cuff Size: Large)   Pulse 76   Ht _0  (1.702 m) Comment: Reported  Wt 215 lb 6.4 oz (97.7 kg)   LMP 05/14/2007   BMI 33.74 kg/m   Height: _1  (170.2 cm) (Reported)  General appearance: alert, cooperative and appears stated age Breasts: normal appearance, no masses or tenderness Abdomen: soft, non-tender; bowel sounds normal; no masses,  no organomegaly Lymph nodes: Cervical, supraclavicular, and axillary nodes normal.  No abnormal inguinal nodes palpated Neurologic: Grossly normal  Pelvic: External genitalia:  no lesions              Urethra:   normal appearing urethra with no masses, tenderness or lesions              Bartholins and Skenes: normal                 Vagina: normal appearing vagina with atrophic changes and no discharge, no lesions              Cervix: no lesions              Pap taken: Yes.   Bimanual Exam:  Uterus:  enlarged, 8-10 weeks size              Adnexa: normal adnexa               Rectovaginal: Confirms               Anus:  normal sphincter tone, no lesions  Chaperone, Octaviano Batty, CMA, was present for exam.  Assessment/Plan: 1. Encntr for gyn exam (general) (routine) w/o abn findings - Pap smear obtained today - Mammogram up to date - Colonoscopy due.  Pt will call Dr. Collene Mares - Bone mineral density placed. - lab work done done with PCP - vaccines reviewed/updated  2. Cervical cancer screening - Cytology - PAP( [place]) - PR OBTAINING SCREEN PAP SMEAR  3. Fibroids, intramural - stable on exam  4. Hypoestrogenism - DG BONE DENSITY (DXA); Future  5. Essential hypertension  6. Stage 3a chronic kidney disease ([place])

## 2022-02-19 LAB — CYTOLOGY - PAP: Diagnosis: NEGATIVE

## 2022-02-20 NOTE — Telephone Encounter (Signed)
Pt cannot attend dates in Oct.   She is now scheduled to see Dr. Johney Frame on 03/14/22 at 10 am end slot.   Pt made aware of appt date and time by Scheduling Dept.

## 2022-03-05 ENCOUNTER — Encounter: Payer: Self-pay | Admitting: Internal Medicine

## 2022-03-05 NOTE — Progress Notes (Unsigned)
Subjective:    Patient ID: Kathleen Lucero, female    DOB: 12/31/1955, 66 y.o.   MRN: [id number]     HPI Jemimah is here for follow up of her chronic medical problems, including kidney function, hypertension, hld, severe MR, dec GFR  Did not take ozempic.   Medications and allergies reviewed with patient and updated if appropriate.  Current Outpatient Medications on File Prior to Visit  Medication Sig Dispense Refill   ACCU-CHEK GUIDE test strip 4 (four) times daily. use as directed     Accu-Chek Softclix Lancets lancets 4 (four) times daily.     Ascorbic Acid (VITAMIN C) 100 MG tablet Take 100 mg by mouth daily.     blood glucose meter kit and supplies KIT Dispense based on patient and insurance preference. Use up to four times daily as directed. (FOR E11.9). 1 each 0   Cholecalciferol (VITAMIN D3) 25 MCG (1000 UT) CAPS Take 1 capsule (1,000 Units total) by mouth daily. 60 capsule    diltiazem (CARDIZEM CD) 180 MG 24 hr capsule TAKE 1 CAPSULE BY MOUTH EVERY DAY 90 capsule 1   diltiazem (CARTIA XT) 240 MG 24 hr capsule Take 1 capsule (240 mg total) by mouth daily. 30 capsule 5   rosuvastatin (CRESTOR) 20 MG tablet TAKE 1 TABLET BY MOUTH EVERY DAY 90 tablet 3   Semaglutide,0.25 or 0.5MG/DOS, (OZEMPIC, 0.25 OR 0.5 MG/DOSE,) 2 MG/1.5ML SOPN Inject 0.25 mg into the skin once a week for 30 days, THEN 0.5 mg once a week. (Patient not taking: Reported on 02/15/2022) 2 mL 0   spironolactone (ALDACTONE) 25 MG tablet Take 1 tablet (25 mg total) by mouth daily. 90 tablet 3   Zinc 50 MG TABS      No current facility-administered medications on file prior to visit.     Review of Systems     Objective:  There were no vitals filed for this visit. BP Readings from Last 3 Encounters:  02/15/22 (!) 155/76  12/04/21 134/80  10/11/21 126/78   Wt Readings from Last 3 Encounters:  02/15/22 215 lb 6.4 oz (97.7 kg)  12/04/21 240 lb (108.9 kg)  10/11/21 238 lb (108 kg)   There is no  height or weight on file to calculate BMI.    Physical Exam     Lab Results  Component Value Date   WBC 6.5 10/11/2021   HGB 13.3 10/11/2021   HCT 41.1 10/11/2021   PLT 197.0 10/11/2021   GLUCOSE 99 12/17/2021   CHOL 171 10/11/2021   TRIG 112.0 10/11/2021   HDL 47.40 10/11/2021   LDLDIRECT 130.2 11/05/2006   LDLCALC 101 (H) 10/11/2021   ALT 17 10/11/2021   AST 20 10/11/2021   NA 143 12/17/2021   K 3.7 12/17/2021   CL 102 12/17/2021   CREATININE 1.18 12/17/2021   BUN 20 12/17/2021   CO2 26 12/17/2021   TSH 2.30 12/22/2020   HGBA1C 6.6 (H) 12/04/2021   MICROALBUR 0.7 12/22/2020     Assessment & Plan:    See Problem List for Assessment and Plan of chronic medical problems.    

## 2022-03-05 NOTE — Patient Instructions (Signed)
      Blood work was ordered.   The lab is on the first floor.    Medications changes include :   none    A thyroid US was ordered.      Return in about 6 months (around 09/05/2022) for follow up.

## 2022-03-06 ENCOUNTER — Ambulatory Visit (INDEPENDENT_AMBULATORY_CARE_PROVIDER_SITE_OTHER): Payer: Medicare Other | Admitting: Internal Medicine

## 2022-03-06 VITALS — BP 132/70 | HR 61 | Temp 98.0°F | Ht 67.0 in | Wt 209.0 lb

## 2022-03-06 DIAGNOSIS — E119 Type 2 diabetes mellitus without complications: Secondary | ICD-10-CM

## 2022-03-06 DIAGNOSIS — I10 Essential (primary) hypertension: Secondary | ICD-10-CM

## 2022-03-06 DIAGNOSIS — E7849 Other hyperlipidemia: Secondary | ICD-10-CM

## 2022-03-06 DIAGNOSIS — I340 Nonrheumatic mitral (valve) insufficiency: Secondary | ICD-10-CM

## 2022-03-06 DIAGNOSIS — E042 Nontoxic multinodular goiter: Secondary | ICD-10-CM

## 2022-03-06 LAB — LIPID PANEL
Cholesterol: 165 mg/dL (ref 0–200)
HDL: 52.1 mg/dL (ref >39.00)
LDL Cholesterol: 99 mg/dL (ref 0–99)
NonHDL: 112.63
Total CHOL/HDL Ratio: 3
Triglycerides: 66 mg/dL (ref 0.0–149.0)
VLDL: 13.2 mg/dL (ref 0.0–40.0)

## 2022-03-06 LAB — COMPREHENSIVE METABOLIC PANEL
ALT: 17 U/L (ref 0–35)
AST: 19 U/L (ref 0–37)
Albumin: 4.8 g/dL (ref 3.5–5.2)
Alkaline Phosphatase: 97 U/L (ref 39–117)
BUN: 15 mg/dL (ref 6–23)
CO2: 27 mEq/L (ref 19–32)
Calcium: 10.7 mg/dL — ABNORMAL HIGH (ref 8.4–10.5)
Chloride: 103 mEq/L (ref 96–112)
Creatinine, Ser: 1.01 mg/dL (ref 0.40–1.20)
GFR: 58.56 mL/min — ABNORMAL LOW (ref >60.00)
Glucose, Bld: 100 mg/dL — ABNORMAL HIGH (ref 70–99)
Potassium: 3.9 mEq/L (ref 3.5–5.1)
Sodium: 139 mEq/L (ref 135–145)
Total Bilirubin: 0.7 mg/dL (ref 0.2–1.2)
Total Protein: 8 g/dL (ref 6.0–8.3)

## 2022-03-06 LAB — MICROALBUMIN / CREATININE URINE RATIO
Creatinine,U: 25.6 mg/dL
Microalb Creat Ratio: 2.7 mg/g (ref 0.0–30.0)
Microalb, Ur: <0.7 mg/dL (ref 0.0–1.9)

## 2022-03-06 LAB — HEMOGLOBIN A1C: Hgb A1c MFr Bld: 6.4 % (ref 4.6–6.5)

## 2022-03-06 MED ORDER — SPIRONOLACTONE 25 MG PO TABS: 25.0000 mg | ORAL_TABLET | Freq: Every day | ORAL | 3 refills | Status: DC

## 2022-03-06 NOTE — Assessment & Plan Note (Signed)
Chronic Mitral regurgitation has worsened-recent echo showed severe MR Has appointment with cardiology for further evaluation and discussion of treatment

## 2022-03-06 NOTE — Assessment & Plan Note (Addendum)
Chronic  Lab Results  Component Value Date   HGBA1C 6.6 (H) 12/04/2021   Sugars well controlled Check A1c Currently lifestyle controlled-never took Ozempic-concerned about possible side effects Depending on GFR can consider starting [place] Continue regular exercise, diabetic diet Continue weight loss efforts

## 2022-03-06 NOTE — Assessment & Plan Note (Signed)
Chronic Chronic thyromegaly secondary to goiter-?  More prominent now-this may be related to weight loss Last ultrasound was several years ago Thyroid ultrasound ordered

## 2022-03-06 NOTE — Assessment & Plan Note (Addendum)
Chronic Blood pressure well controlled CMP Continue diltiazem 180 mg daily, spironolactone 25 mg daily

## 2022-03-06 NOTE — Assessment & Plan Note (Signed)
Chronic Regular exercise and healthy diet encouraged Check lipid panel  Continue Crestor 20 mg daily 

## 2022-03-13 NOTE — Progress Notes (Unsigned)
Cardiology Office Note:    Date:  03/13/2022   ID:  Kathleen Lucero, DOB 1955/11/01, MRN [id number]  PCP:  Binnie Rail, MD   [place] Providers Cardiologist:  None {   Referring MD: Binnie Rail, MD    History of Present Illness:    Kathleen Lucero is a 66 y.o. female with a hx of HTN, bicuspid aortic valve, severe MR, DMII, CKD IIIA who was referred by Dr. Quay Burow for further evaluation of severe MR.  Patient seen by Dr. Quay Burow on 03/06/22. Note reviewed. Had TTE 02/14/22 with LVEF 65-70%, G1DD, normal RV, normal PASP, moderate LAE, moderate TR, severe, mainly central MR, mild AS.  Today, ***  Past Medical History:  Diagnosis Date   Diabetes mellitus ([place])    A1c 6.7 in 3/18   GERD (gastroesophageal reflux disease) 2007   Goiter    Heart murmur    "my whole life"   Hyperlipidemia    Hypertension    Lumbar degenerative disc disease    S/P ruptured disc, Dr. Lindwood Qua   T3 thyrotoxicosis 2011   S/P RAI   Uterine fibroid    Dr Sabra Heck    Past Surgical History:  Procedure Laterality Date   TUBAL LIGATION  05/14/1979   WISDOM TOOTH EXTRACTION      Current Medications: No outpatient medications have been marked as taking for the 03/14/22 encounter (Appointment) with Freada Bergeron, MD.     Allergies:   Other and Codeine sulfate   Social History   Socioeconomic History   Marital status: Married    Spouse name: Not on file   Number of children: 3   Years of education: Not on file   Highest education level: Not on file  Occupational History   Occupation: Best boy: [employer]  Tobacco Use   Smoking status: Never   Smokeless tobacco: Never  Vaping Use   Vaping Use: Never used  Substance and Sexual Activity   Alcohol use: No   Drug use: No   Sexual activity: Yes    Partners: Male    Birth control/protection: Surgical, Post-menopausal    Comment: BTL  Other Topics Concern   Not on file  Social History  Narrative   Walks 2X wkly   3 sons, 6 grandchildren   [place] from [place], [place]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   Social Determinants of Radio broadcast assistant Strain: Not on file  Food Insecurity: Not on file  Transportation Needs: Not on file  Physical Activity: Not on file  Stress: Not on file  Social Connections: Not on file     Family History: The patient's ***family history includes Alzheimer's disease in her maternal grandmother and mother; Cancer (age of onset: 4) in her brother; Dementia in her paternal aunt; Dementia (age of onset: 44) in her maternal grandmother; Diabetes in her maternal grandmother; Hypertension in her brother, maternal grandmother, and mother; Stroke (age of onset: 47) in her mother; Thyroid disease in her maternal grandmother. There is no history of Heart attack.  ROS:   Please see the history of present illness.    *** All other systems reviewed and are negative.  EKGs/Labs/Other Studies Reviewed:    The following studies were reviewed today: TTE 02/24/22: IMPRESSIONS     1. Recommend referral to Cardiology for further evaluation of severe  mitral regurgitation.   2. The mitral valve leaflets are mildly thickened. There is severe,  mainly central mitral regurgitation likely functional  in the setting of  atrial dilation. It also appears that both leaflets are attached to a  single papillary muscle (clip 49) which may   be contributing to her underlying mitral regurgitation. Recommend  Cardiology referral, TEE for further evaluation of MR, and CT to better  define LV anatomy. The mitral valve is abnormal. Severe mitral valve  regurgitation.   3. The aortic valve is tricuspid (no evidence of bicuspid aortic valve).  There is mild calcification of the aortic valve. There is mild thickening  of the aortic valve. Aortic valve regurgitation is not visualized. Mild  aortic valve stenosis. Aortic  valve area, by VTI measures 1.75 cm. Aortic valve mean gradient  measures  16.0 mmHg. Aortic valve Vmax measures 2.64 m/s. Elevated mean gradient  likely due to high output state.   4. Left ventricular ejection fraction, by estimation, is 65 to 70%. The  left ventricle has normal function. The left ventricle has no regional  wall motion abnormalities. There is mild concentric left ventricular  hypertrophy. Left ventricular diastolic  parameters are consistent with Grade I diastolic dysfunction (impaired  relaxation).   5. Right ventricular systolic function is normal. The right ventricular  size is normal. There is normal pulmonary artery systolic pressure. The  estimated right ventricular systolic pressure is 66.0 mmHg.   6. Left atrial size was moderately dilated.   7. Right atrial size was mildly dilated.   8. Tricuspid valve regurgitation is moderate.   9. Aortic dilatation noted. There is borderline dilatation of the  ascending aorta, measuring 38 mm.   Comparison(s): Compared to prior echo report, her MR is now severe. Her  aortic valve is tricuspid and better visualized on current study with mild  aortic stenosis.   EKG:  EKG is *** ordered today.  The ekg ordered today demonstrates ***  Recent Labs: 10/11/2021: Hemoglobin 13.3; Platelets 197.0 03/06/2022: ALT 17; BUN 15; Creatinine, Ser 1.01; Potassium 3.9; Sodium 139  Recent Lipid Panel    Component Value Date/Time   CHOL 165 03/06/2022 1032   TRIG 66.0 03/06/2022 1032   HDL 52.10 03/06/2022 1032   CHOLHDL 3 03/06/2022 1032   VLDL 13.2 03/06/2022 1032   LDLCALC 99 03/06/2022 1032   LDLDIRECT 130.2 11/05/2006 0000     Risk Assessment/Calculations:   {Does this patient have ATRIAL FIBRILLATION?:[id number]}  No BP recorded.  {Refresh Note OR Click here to enter BP  :1}***         Physical Exam:    VS:  LMP 05/14/2007     Wt Readings from Last 3 Encounters:  03/06/22 209 lb (94.8 kg)  02/15/22 215 lb 6.4 oz (97.7 kg)  12/04/21 240 lb (108.9 kg)     GEN: *** Well  nourished, well developed in no acute distress HEENT: Normal NECK: No JVD; No carotid bruits LYMPHATICS: No lymphadenopathy CARDIAC: ***RRR, no murmurs, rubs, gallops RESPIRATORY:  Clear to auscultation without rales, wheezing or rhonchi  ABDOMEN: Soft, non-tender, non-distended MUSCULOSKELETAL:  No edema; No deformity  SKIN: Warm and dry NEUROLOGIC:  Alert and oriented x 3 PSYCHIATRIC:  Normal affect   ASSESSMENT:    No diagnosis found. PLAN:    In order of problems listed above:  #Severe MR: TTE 02/2022 with severe central mitral regurgitation with possible single papillary muscle. Normal PASP and normal RV. Currently, *** -Check TEE -Check CT to better define LV anatomy  #HTN: -Continue dilt '180mg'$  daily -Continue spiro '25mg'$  daily  #HLD: -Continue crestor '20mg'$  daily -LDL 99  #  Mild AS: #Moderate TR: AVA 1.75cm2, mean gradient 6mHg and moderate TR. -Continue serial monitoring  #Ascending aortic dilation: TTE showed ascending aortic dilation at 359m -Continue serial monitoring with yearly echoes      {Are you ordering a CV Procedure (e.g. stress test, cath, DCCV, TEE, etc)?   Press F2        :[id number]}  Medication Adjustments/Labs and Tests Ordered: Current medicines are reviewed at length with the patient today.  Concerns regarding medicines are outlined above.  No orders of the defined types were placed in this encounter.  No orders of the defined types were placed in this encounter.   There are no Patient Instructions on file for this visit.   Signed, HeFreada BergeronMD  03/13/2022 8:30 PM    [place]

## 2022-03-14 ENCOUNTER — Ambulatory Visit: Payer: Medicare Other | Attending: Cardiology | Admitting: Cardiology

## 2022-03-14 VITALS — BP 154/90 | HR 78 | Ht 67.0 in | Wt 206.2 lb

## 2022-03-14 DIAGNOSIS — I340 Nonrheumatic mitral (valve) insufficiency: Secondary | ICD-10-CM | POA: Insufficient documentation

## 2022-03-14 DIAGNOSIS — E782 Mixed hyperlipidemia: Secondary | ICD-10-CM | POA: Insufficient documentation

## 2022-03-14 DIAGNOSIS — I071 Rheumatic tricuspid insufficiency: Secondary | ICD-10-CM | POA: Insufficient documentation

## 2022-03-14 DIAGNOSIS — I350 Nonrheumatic aortic (valve) stenosis: Secondary | ICD-10-CM | POA: Diagnosis not present

## 2022-03-14 DIAGNOSIS — I10 Essential (primary) hypertension: Secondary | ICD-10-CM | POA: Insufficient documentation

## 2022-03-14 NOTE — Progress Notes (Signed)
Cardiology Office Note:    Date:  03/14/2022   ID:  Kathleen Lucero, DOB Jun 06, 1955, MRN [id number]  PCP:  Binnie Rail, MD   [place] Providers Cardiologist:  None {   Referring MD: Binnie Rail, MD    History of Present Illness:    Kathleen Lucero is a 66 y.o. female with a hx of HTN, bicuspid aortic valve, severe MR, DMII, CKD IIIA who was referred by Dr. Quay Burow for further evaluation of severe MR.  Patient seen by Dr. Quay Burow on 03/06/22. Note reviewed. Had TTE 02/14/22 with LVEF 65-70%, G1DD, normal RV, normal PASP, moderate LAE, moderate TR, severe, mainly central MR, mild AS.  Today, she presents feeling overall well. She has been monitoring her blood pressure at home and reports that her systolic blood pressure usually is in the low 130's. She attributes her high blood pressure of 172/96 today to the fact that she was unaware she was getting tests done.   She states she feels great from a CV perspective. No chest pain, SOB, orthopnea, or PND. She is active and walks on a daily basis and feels well with activity. States she has been working on weight loss. Compliant with all medications.  We discussed the echo at length with her and given that this was a lot of information and she was not feeling prepared for this today, she would like to think things over before pursuing any further testing.  Past Medical History:  Diagnosis Date   Diabetes mellitus ([place])    A1c 6.7 in 3/18   GERD (gastroesophageal reflux disease) 2007   Goiter    Heart murmur    "my whole life"   Hyperlipidemia    Hypertension    Lumbar degenerative disc disease    S/P ruptured disc, Dr. Lindwood Qua   T3 thyrotoxicosis 2011   S/P RAI   Uterine fibroid    Dr Sabra Heck    Past Surgical History:  Procedure Laterality Date   TUBAL LIGATION  05/14/1979   WISDOM TOOTH EXTRACTION      Current Medications: Current Meds  Medication Sig   diltiazem (CARDIZEM CD) 180 MG 24 hr capsule  TAKE 1 CAPSULE BY MOUTH EVERY DAY   rosuvastatin (CRESTOR) 20 MG tablet TAKE 1 TABLET BY MOUTH EVERY DAY   spironolactone (ALDACTONE) 25 MG tablet Take 1 tablet (25 mg total) by mouth daily.     Allergies:   Other and Codeine sulfate   Social History   Socioeconomic History   Marital status: Married    Spouse name: Not on file   Number of children: 3   Years of education: Not on file   Highest education level: Not on file  Occupational History   Occupation: Best boy: [employer]  Tobacco Use   Smoking status: Never   Smokeless tobacco: Never  Vaping Use   Vaping Use: Never used  Substance and Sexual Activity   Alcohol use: No   Drug use: No   Sexual activity: Yes    Partners: Male    Birth control/protection: Surgical, Post-menopausal    Comment: BTL  Other Topics Concern   Not on file  Social History Narrative   Walks 2X wkly   3 sons, 6 grandchildren   [place] from [place], [place]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   Social Determinants of Radio broadcast assistant Strain: Not on file  Food Insecurity: Not on file  Transportation Needs: Not on file  Physical Activity:  Not on file  Stress: Not on file  Social Connections: Not on file     Family History: The patient's family history includes Alzheimer's disease in her maternal grandmother and mother; Cancer (age of onset: 65) in her brother; Dementia in her paternal aunt; Dementia (age of onset: 71) in her maternal grandmother; Diabetes in her maternal grandmother; Hypertension in her brother, maternal grandmother, and mother; Stroke (age of onset: 67) in her mother; Thyroid disease in her maternal grandmother. There is no history of Heart attack.  ROS:   Please see the history of present illness.    Review of Systems  Constitutional:  Negative for diaphoresis, fever and weight loss.  HENT:  Negative for congestion, ear discharge and ear pain.   Eyes:  Negative for double vision, photophobia and discharge.   Respiratory:  Negative for cough, sputum production and shortness of breath.   Cardiovascular:  Negative for chest pain, palpitations, orthopnea, claudication, leg swelling and PND.  Gastrointestinal:  Negative for constipation, nausea and vomiting.  Genitourinary:  Negative for frequency and urgency.  Musculoskeletal:  Negative for joint pain and neck pain.  Skin:  Negative for itching and rash.  Neurological:  Negative for tremors, sensory change, seizures and headaches.  Endo/Heme/Allergies:  Negative for environmental allergies. Does not bruise/bleed easily.  Psychiatric/Behavioral:  Negative for depression and substance abuse. The patient is not nervous/anxious.    All other systems reviewed and are negative.  EKGs/Labs/Other Studies Reviewed:    The following studies were reviewed today: TTE 03-07-22: IMPRESSIONS    1. Recommend referral to Cardiology for further evaluation of severe mitral regurgitation.   2. The mitral valve leaflets are mildly thickened. There is severe, mainly central mitral regurgitation likely functional in the setting of atrial dilation. It also appears that both leaflets are attached to a single papillary muscle (clip 49) which may be contributing to her underlying mitral regurgitation. Recommend Cardiology referral, TEE for further evaluation of MR, and CT to better define LV anatomy. The mitral valve is abnormal. Severe mitral valve regurgitation.   3. The aortic valve is tricuspid (no evidence of bicuspid aortic valve). There is mild calcification of the aortic valve. There is mild thickening of the aortic valve. Aortic valve regurgitation is not visualized. Mild aortic valve stenosis. Aortic valve area, by VTI measures 1.75 cm. Aortic valve mean gradient measures 16.0 mmHg. Aortic valve Vmax measures 2.64 m/s. Elevated mean gradient likely due to high output state.   4. Left ventricular ejection fraction, by estimation, is 65 to 70%. The left ventricle has  normal function. The left ventricle has no regional wall motion abnormalities. There is mild concentric left ventricular hypertrophy. Left ventricular diastolic parameters are consistent with Grade I diastolic dysfunction (impaired relaxation).   5. Right ventricular systolic function is normal. The right ventricular size is normal. There is normal pulmonary artery systolic pressure. The estimated right ventricular systolic pressure is 29.9 mmHg.   6. Left atrial size was moderately dilated.   7. Right atrial size was mildly dilated.   8. Tricuspid valve regurgitation is moderate.   9. Aortic dilatation noted. There is borderline dilatation of the ascending aorta, measuring 38 mm.   Comparison(s): Compared to prior echo report, her MR is now severe. Her aortic valve is tricuspid and better visualized on current study with mild aortic stenosis.   EKG:   03/14/2022: Sinus . Rate 70 bpm.  09/18/2016: NSR, HR 83, leftward axis, borderline LVH, nonspecific ST-T wave changes, QTc 418 ms  Recent Labs: 10/11/2021: Hemoglobin 13.3; Platelets 197.0 03/06/2022: ALT 17; BUN 15; Creatinine, Ser 1.01; Potassium 3.9; Sodium 139  Recent Lipid Panel    Component Value Date/Time   CHOL 165 03/06/2022 1032   TRIG 66.0 03/06/2022 1032   HDL 52.10 03/06/2022 1032   CHOLHDL 3 03/06/2022 1032   VLDL 13.2 03/06/2022 1032   LDLCALC 99 03/06/2022 1032   LDLDIRECT 130.2 11/05/2006 0000     Risk Assessment/Calculations:      HYPERTENSION CONTROL Vitals:   03/14/22 1018 03/14/22 1041  BP: (!) 172/96 (!) 154/90    The patient's blood pressure is elevated above target today.  In order to address the patient's elevated BP: The blood pressure is usually elevated in clinic.  Blood pressures monitored at home have been optimal.            Physical Exam:    VS:  BP (!) 154/90 (BP Location: Left Arm, Patient Position: Sitting)   Pulse 78   Ht '5\' 7"'$  (1.702 m)   Wt 206 lb 3.2 oz (93.5 kg)   LMP 05/14/2007    SpO2 98%   BMI 32.30 kg/m     Wt Readings from Last 3 Encounters:  03/14/22 206 lb 3.2 oz (93.5 kg)  03/06/22 209 lb (94.8 kg)  02/15/22 215 lb 6.4 oz (97.7 kg)     GEN: Well nourished, well developed in no acute distress HEENT: Normal NECK: No JVD; No carotid bruits CARDIAC: RRR, 2-8/7 systolic murmur. No rubs or gallops RESPIRATORY:  Clear to auscultation without rales, wheezing or rhonchi  ABDOMEN: Soft, non-tender, non-distended MUSCULOSKELETAL:  No edema; No deformity  SKIN: Warm and dry NEUROLOGIC:  Alert and oriented x 3 PSYCHIATRIC:  Normal affect   ASSESSMENT:    1. Severe mitral regurgitation   2. Mitral valve insufficiency, unspecified etiology   3. Moderate tricuspid regurgitation   4. Mild aortic stenosis   5. Primary hypertension   6. Mixed hyperlipidemia    PLAN:   In order of problems listed above:  #Severe MR: TTE 02/2022 with severe central mitral regurgitation with possible single papillary muscle. Normal PASP and normal RV. Currently, feels well and is euvolemic on exam. States she would like to think about testing further before proceeding. Willing to discuss further at follow-up visit in 1 month. -Would like to think about testing further before proceeding -Willing to follow-up in 1 month to discuss further; ideally would start with a TEE. Did not discuss CT at this visit but may consider to better define anatomy pending TEE  #HTN: Very elevated in clinic but states it is much better controlled at home. She wishes to continue to monitor at this time and will let us or Dr. Quay Burow know if running high. -Continue dilt '180mg'$  daily -Continue spiro '25mg'$  daily  #HLD: -Continue crestor '20mg'$  daily -LDL 99  #Mild AS: #Moderate TR: AVA 1.75cm2, mean gradient 62mHg and moderate TR. -Continue serial monitoring  #Ascending aortic dilation: TTE showed ascending aortic dilation at 310m -Continue serial monitoring with yearly echoes        Follow up  in 1 month.  Medication Adjustments/Labs and Tests Ordered: Current medicines are reviewed at length with the patient today.  Concerns regarding medicines are outlined above.  Orders Placed This Encounter  Procedures   EKG 12-Lead   No orders of the defined types were placed in this encounter.  Patient Instructions  Medication Instructions:   Your physician recommends that you continue on your current medications as  directed. Please refer to the Current Medication list given to you today.  *If you need a refill on your cardiac medications before your next appointment, please call your pharmacy*    Follow-Up:  [place] NP PER DR. Johney Frame -   Important Information About Sugar         I,Rachel Rivera,acting as a scribe for Freada Bergeron, MD.,have documented all relevant documentation on the behalf of Freada Bergeron, MD,as directed by  Freada Bergeron, MD while in the presence of Freada Bergeron, MD.  I, Freada Bergeron, MD, have reviewed all documentation for this visit. The documentation on 03/14/22 for the exam, diagnosis, procedures, and orders are all accurate and complete.   Signed, Freada Bergeron, MD  03/14/2022 11:17 AM    [place]

## 2022-03-14 NOTE — Patient Instructions (Signed)
Medication Instructions:   Your physician recommends that you continue on your current medications as directed. Please refer to the Current Medication list given to you today.  *If you need a refill on your cardiac medications before your next appointment, please call your pharmacy*    Follow-Up:  [place] NP PER DR. Johney Frame -   Important Information About Sugar

## 2022-04-09 ENCOUNTER — Other Ambulatory Visit: Payer: Self-pay | Admitting: Internal Medicine

## 2022-04-12 ENCOUNTER — Encounter: Payer: No Typology Code available for payment source | Admitting: Internal Medicine

## 2022-04-16 ENCOUNTER — Ambulatory Visit: Payer: Medicare Other | Admitting: Nurse Practitioner

## 2022-06-11 ENCOUNTER — Ambulatory Visit: Payer: Medicare Other | Admitting: Nurse Practitioner

## 2022-06-12 ENCOUNTER — Encounter (HOSPITAL_BASED_OUTPATIENT_CLINIC_OR_DEPARTMENT_OTHER): Payer: Self-pay | Admitting: Obstetrics & Gynecology

## 2022-06-28 NOTE — Progress Notes (Deleted)
Cardiology Office Note:    Date:  06/28/2022   ID:  [name], DOB [birth year], MRN [id number]  PCP:  Binnie Rail, MD  [place] Providers Cardiologist:  None { Click to update primary MD,subspecialty MD or APP then REFRESH:1}  *** Referring MD: Binnie Rail, MD   Chief Complaint:  No chief complaint on file. {Click here for Visit Info    :1}    History of Present Illness:   Kathleen Lucero is a 67 y.o. female with  with a hx of HTN, bicuspid aortic valve, severe MR, DMII, CKD IIIA who was referred by Dr. Quay Burow for further evaluation of severe MR.   Patient seen by Dr. Quay Burow on 03/06/22. Had TTE 02/14/22 with LVEF 65-70%, G1DD, normal RV, normal PASP, moderate LAE, moderate TR, severe, mainly central MR, mild AS.  Patient saw Dr. Johney Frame 03/2022 and echo reviewed but patient wanted to think about things before further testing.            Past Medical History:  Diagnosis Date   Diabetes mellitus ([place])    A1c 6.7 in 3/18   GERD (gastroesophageal reflux disease) 2007   Goiter    Heart murmur    "my whole life"   Hyperlipidemia    Hypertension    Lumbar degenerative disc disease    S/P ruptured disc, Dr. Lindwood Qua   T3 thyrotoxicosis 2011   S/P RAI   Uterine fibroid    Dr Sabra Heck   Current Medications: No outpatient medications have been marked as taking for the 07/09/22 encounter (Appointment) with Imogene Burn, PA-C.    Allergies:   Other and Codeine sulfate   Social History   Tobacco Use   Smoking status: Never   Smokeless tobacco: Never  Vaping Use   Vaping Use: Never used  Substance Use Topics   Alcohol use: No   Drug use: No    Family Hx: The patient's family history includes Alzheimer's disease in her maternal grandmother and mother; Cancer (age of onset: 15) in her brother; Dementia in her paternal aunt; Dementia (age of onset: 79) in her maternal grandmother; Diabetes in her maternal grandmother; Hypertension in her  brother, maternal grandmother, and mother; Stroke (age of onset: 16) in her mother; Thyroid disease in her maternal grandmother. There is no history of Heart attack.  ROS     Physical Exam:    VS:  LMP 05/14/2007     Wt Readings from Last 3 Encounters:  03/14/22 206 lb 3.2 oz (93.5 kg)  03/06/22 209 lb (94.8 kg)  02/15/22 215 lb 6.4 oz (97.7 kg)    Physical Exam  GEN: Well nourished, well developed, in no acute distress  HEENT: normal  Neck: no JVD, carotid bruits, or masses Cardiac:RRR; no murmurs, rubs, or gallops  Respiratory:  clear to auscultation bilaterally, normal work of breathing GI: soft, nontender, nondistended, + BS Ext: without cyanosis, clubbing, or edema, Good distal pulses bilaterally MS: no deformity or atrophy  Skin: warm and dry, no rash Neuro:  Alert and Oriented x 3, Strength and sensation are intact Psych: euthymic mood, full affect        EKGs/Labs/Other Test Reviewed:    EKG:  EKG is *** ordered today.  The ekg ordered today demonstrates ***  Recent Labs: 10/11/2021: Hemoglobin 13.3; Platelets 197.0 03/06/2022: ALT 17; BUN 15; Creatinine, Ser 1.01; Potassium 3.9; Sodium 139   Recent Lipid Panel Recent Labs    03/06/22 1032  CHOL 165  TRIG 66.0  HDL 52.10  VLDL 13.2  LDLCALC 99     Prior CV Studies: {Select studies to display:26339}    TTE 03/19/2022: IMPRESSIONS    1. Recommend referral to Cardiology for further evaluation of severe mitral regurgitation.   2. The mitral valve leaflets are mildly thickened. There is severe, mainly central mitral regurgitation likely functional in the setting of atrial dilation. It also appears that both leaflets are attached to a single papillary muscle (clip 49) which may be contributing to her underlying mitral regurgitation. Recommend Cardiology referral, TEE for further evaluation of MR, and CT to better define LV anatomy. The mitral valve is abnormal. Severe mitral valve regurgitation.   3. The aortic  valve is tricuspid (no evidence of bicuspid aortic valve). There is mild calcification of the aortic valve. There is mild thickening of the aortic valve. Aortic valve regurgitation is not visualized. Mild aortic valve stenosis. Aortic valve area, by VTI measures 1.75 cm. Aortic valve mean gradient measures 16.0 mmHg. Aortic valve Vmax measures 2.64 m/s. Elevated mean gradient likely due to high output state.   4. Left ventricular ejection fraction, by estimation, is 65 to 70%. The left ventricle has normal function. The left ventricle has no regional wall motion abnormalities. There is mild concentric left ventricular hypertrophy. Left ventricular diastolic parameters are consistent with Grade I diastolic dysfunction (impaired relaxation).   5. Right ventricular systolic function is normal. The right ventricular size is normal. There is normal pulmonary artery systolic pressure. The estimated right ventricular systolic pressure is XX123456 mmHg.   6. Left atrial size was moderately dilated.   7. Right atrial size was mildly dilated.   8. Tricuspid valve regurgitation is moderate.   9. Aortic dilatation noted. There is borderline dilatation of the ascending aorta, measuring 38 mm.   Comparison(s): Compared to prior echo report, her MR is now severe. Her aortic valve is tricuspid and better visualized on current study with mild aortic stenosis.     Risk Assessment/Calculations/Metrics:   {Does this patient have ATRIAL FIBRILLATION?:[phone]}     No BP recorded.  {Refresh Note OR Click here to enter BP  :1}***    ASSESSMENT & PLAN:   No problem-specific Assessment & Plan notes found for this encounter.   #Severe MR: TTE 2022-03-19 with severe central mitral regurgitation with possible single papillary muscle. Normal PASP and normal RV. Currently, feels well and is euvolemic on exam. States she would like to think about testing further before proceeding. Willing to discuss further at follow-up visit in 1  month. -Would like to think about testing further before proceeding -Willing to follow-up in 1 month to discuss further; ideally would start with a TEE. Did not discuss CT at this visit but may consider to better define anatomy pending TEE   #HTN: Very elevated in clinic but states it is much better controlled at home. She wishes to continue to monitor at this time and will let us or Dr. Quay Burow know if running high. -Continue dilt 180m daily -Continue spiro 277mdaily   #HLD: -Continue crestor 2067maily -LDL 99   #Mild AS: #Moderate TR: AVA 1.75cm2, mean gradient 71m70mand moderate TR. -Continue serial monitoring   #Ascending aortic dilation: TTE showed ascending aortic dilation at 38mm27montinue serial monitoring with yearly echoes                {Are you ordering a CV Procedure (e.g. stress test, cath, DCCV, TEE, etc)?   Press F2        :  [id number]   Dispo:  No follow-ups on file.   Medication Adjustments/Labs and Tests Ordered: Current medicines are reviewed at length with the patient today.  Concerns regarding medicines are outlined above.  Tests Ordered: No orders of the defined types were placed in this encounter.  Medication Changes: No orders of the defined types were placed in this encounter.  Sumner Boast, PA-C  06/28/2022 8:43 AM    [facility] [place], [place], [place]  [address] Phone: [phone]; Fax: [phone]

## 2022-07-02 ENCOUNTER — Telehealth: Payer: Self-pay | Admitting: Internal Medicine

## 2022-07-02 MED ORDER — DILTIAZEM HCL ER COATED BEADS 180 MG PO CP24: ORAL_CAPSULE | ORAL | 2 refills | Status: DC

## 2022-07-02 NOTE — Telephone Encounter (Signed)
Sent refilled to [facility]

## 2022-07-02 NOTE — Telephone Encounter (Signed)
Pt would like Rx diltiazem (CARDIZEM CD) 180 MG 24 hr capsule refilled and sent to   [facility], [place], [place] [address]

## 2022-07-03 ENCOUNTER — Other Ambulatory Visit: Payer: Self-pay | Admitting: Internal Medicine

## 2022-07-09 ENCOUNTER — Ambulatory Visit: Payer: Medicare Other | Admitting: Physician Assistant

## 2022-08-07 NOTE — Progress Notes (Deleted)
Cardiology Office Note:    Date:  08/07/2022   ID:  Kathleen Lucero, DOB 1955-06-05, MRN [id number]  PCP:  Binnie Rail, MD  [place] Providers Cardiologist:  None { Click to update primary MD,subspecialty MD or APP then REFRESH:1}  *** Referring MD: Binnie Rail, MD   Chief Complaint:  No chief complaint on file. {Click here for Visit Info    :1}    History of Present Illness:   Kathleen Lucero is a 67 y.o. female    with a hx of HTN, bicuspid aortic valve, severe MR, DMII, CKD IIIA who was referred by Dr. Quay Burow for further evaluation of severe MR.   Patient seen by Dr. Quay Burow on 03/06/22. Note reviewed. Had TTE 02/14/22 with LVEF 65-70%, G1DD, normal RV, normal PASP, moderate LAE, moderate TR, severe, mainly central MR, mild AS.  She saw Dr. Johney Frame 03/2022 and was feeling well and not prepared to proceed with further w/u. Plan for TEE first.            Past Medical History:  Diagnosis Date   Diabetes mellitus ([place])    A1c 6.7 in 3/18   GERD (gastroesophageal reflux disease) 2007   Goiter    Heart murmur    "my whole life"   Hyperlipidemia    Hypertension    Lumbar degenerative disc disease    S/P ruptured disc, Dr. Lindwood Qua   T3 thyrotoxicosis 2011   S/P RAI   Uterine fibroid    Dr Sabra Heck   Current Medications: No outpatient medications have been marked as taking for the 08/20/22 encounter (Appointment) with Imogene Burn, PA-C.    Allergies:   Other and Codeine sulfate   Social History   Tobacco Use   Smoking status: Never   Smokeless tobacco: Never  Vaping Use   Vaping Use: Never used  Substance Use Topics   Alcohol use: No   Drug use: No    Family Hx: The patient's family history includes Alzheimer's disease in her maternal grandmother and mother; Cancer (age of onset: 18) in her brother; Dementia in her paternal aunt; Dementia (age of onset: 60) in her maternal grandmother; Diabetes in her maternal grandmother; Hypertension  in her brother, maternal grandmother, and mother; Stroke (age of onset: 46) in her mother; Thyroid disease in her maternal grandmother. There is no history of Heart attack.  ROS     Physical Exam:    VS:  LMP 05/14/2007     Wt Readings from Last 3 Encounters:  03/14/22 206 lb 3.2 oz (93.5 kg)  03/06/22 209 lb (94.8 kg)  02/15/22 215 lb 6.4 oz (97.7 kg)    Physical Exam  GEN: Well nourished, well developed, in no acute distress  HEENT: normal  Neck: no JVD, carotid bruits, or masses Cardiac:RRR; no murmurs, rubs, or gallops  Respiratory:  clear to auscultation bilaterally, normal work of breathing GI: soft, nontender, nondistended, + BS Ext: without cyanosis, clubbing, or edema, Good distal pulses bilaterally MS: no deformity or atrophy  Skin: warm and dry, no rash Neuro:  Alert and Oriented x 3, Strength and sensation are intact Psych: euthymic mood, full affect        EKGs/Labs/Other Test Reviewed:    EKG:  EKG is *** ordered today.  The ekg ordered today demonstrates ***  Recent Labs: 10/11/2021: Hemoglobin 13.3; Platelets 197.0 03/06/2022: ALT 17; BUN 15; Creatinine, Ser 1.01; Potassium 3.9; Sodium 139   Recent Lipid Panel Recent Labs  03/06/22 1032  CHOL 165  TRIG 66.0  HDL 52.10  VLDL 13.2  LDLCALC 99     Prior CV Studies: {Select studies to display:26339}  TTE 02/2022: IMPRESSIONS    1. Recommend referral to Cardiology for further evaluation of severe mitral regurgitation.   2. The mitral valve leaflets are mildly thickened. There is severe, mainly central mitral regurgitation likely functional in the setting of atrial dilation. It also appears that both leaflets are attached to a single papillary muscle (clip 49) which may be contributing to her underlying mitral regurgitation. Recommend Cardiology referral, TEE for further evaluation of MR, and CT to better define LV anatomy. The mitral valve is abnormal. Severe mitral valve regurgitation.   3. The  aortic valve is tricuspid (no evidence of bicuspid aortic valve). There is mild calcification of the aortic valve. There is mild thickening of the aortic valve. Aortic valve regurgitation is not visualized. Mild aortic valve stenosis. Aortic valve area, by VTI measures 1.75 cm. Aortic valve mean gradient measures 16.0 mmHg. Aortic valve Vmax measures 2.64 m/s. Elevated mean gradient likely due to high output state.   4. Left ventricular ejection fraction, by estimation, is 65 to 70%. The left ventricle has normal function. The left ventricle has no regional wall motion abnormalities. There is mild concentric left ventricular hypertrophy. Left ventricular diastolic parameters are consistent with Grade I diastolic dysfunction (impaired relaxation).   5. Right ventricular systolic function is normal. The right ventricular size is normal. There is normal pulmonary artery systolic pressure. The estimated right ventricular systolic pressure is XX123456 mmHg.   6. Left atrial size was moderately dilated.   7. Right atrial size was mildly dilated.   8. Tricuspid valve regurgitation is moderate.   9. Aortic dilatation noted. There is borderline dilatation of the ascending aorta, measuring 38 mm.   Comparison(s): Compared to prior echo report, her MR is now severe. Her aortic valve is tricuspid and better visualized on current study with mild aortic stenosis.      Risk Assessment/Calculations/Metrics:   {Does this patient have ATRIAL FIBRILLATION?:[id number]}     No BP recorded.  {Refresh Note OR Click here to enter BP  :1}***    ASSESSMENT & PLAN:   No problem-specific Assessment & Plan notes found for this encounter.   #Severe MR: TTE 02/2022 with severe central mitral regurgitation with possible single papillary muscle. Normal PASP and normal RV. Currently, feels well and is euvolemic on exam. States she would like to think about testing further before proceeding. Willing to discuss further at follow-up  visit in 1 month. -Would like to think about testing further before proceeding -Willing to follow-up in 1 month to discuss further; ideally would start with a TEE. Did not discuss CT at this visit but may consider to better define anatomy pending TEE   #HTN: Very elevated in clinic but states it is much better controlled at home. She wishes to continue to monitor at this time and will let us or Dr. Quay Burow know if running high. -Continue dilt 180mg  daily -Continue spiro 25mg  daily   #HLD: -Continue crestor 20mg  daily -LDL 99   #Mild AS: #Moderate TR: AVA 1.75cm2, mean gradient 46mmHg and moderate TR. -Continue serial monitoring   #Ascending aortic dilation: TTE showed ascending aortic dilation at 56mm. -Continue serial monitoring with yearly echoes                {Are you ordering a CV Procedure (e.g. stress test, cath, DCCV, TEE, etc)?  Press F2        :[id number]   Dispo:  No follow-ups on file.   Medication Adjustments/Labs and Tests Ordered: Current medicines are reviewed at length with the patient today.  Concerns regarding medicines are outlined above.  Tests Ordered: No orders of the defined types were placed in this encounter.  Medication Changes: No orders of the defined types were placed in this encounter.  Sumner Boast, PA-C  08/07/2022 2:29 PM    [facility] [place], [place], [place]  [address] Phone: [phone]; Fax: [phone]

## 2022-08-20 ENCOUNTER — Ambulatory Visit: Payer: Medicare Other | Admitting: Physician Assistant

## 2022-09-05 ENCOUNTER — Ambulatory Visit (INDEPENDENT_AMBULATORY_CARE_PROVIDER_SITE_OTHER): Payer: Medicare Other

## 2022-09-05 VITALS — Ht 67.0 in | Wt 206.0 lb

## 2022-09-05 DIAGNOSIS — Z780 Asymptomatic menopausal state: Secondary | ICD-10-CM

## 2022-09-05 DIAGNOSIS — Z0000 Encounter for general adult medical examination without abnormal findings: Secondary | ICD-10-CM | POA: Diagnosis not present

## 2022-09-05 NOTE — Patient Instructions (Addendum)
       Blood work was ordered.   The lab is on the first floor.    Medications changes include :   increase diltiazem to 240 mg daily    A thyroid ultrasound was ordered.     Someone will call you to schedule an appointment.     Return in about 6 months (around 03/08/2023) for follow up.

## 2022-09-05 NOTE — Patient Instructions (Signed)
Kathleen Lucero , Thank you for taking time to come for your Medicare Wellness Visit. I appreciate your ongoing commitment to your health goals. Please review the following plan we discussed and let me know if I can assist you in the future.   These are the goals we discussed:  Goals      Patient Stated     Keep weight down        This is a list of the screening recommended for you and due dates:  Health Maintenance  Topic Date Due   DTaP/Tdap/Td vaccine (1 - Tdap) Never done   Zoster (Shingles) Vaccine (1 of 2) Never done   Colon Cancer Screening  09/30/2021   DEXA scan (bone density measurement)  Never done   COVID-19 Vaccine (4 - 2023-24 season) 01/11/2022   Hemoglobin A1C  09/05/2022   Pneumonia Vaccine (1 of 1 - PCV) 03/07/2023*   Complete foot exam   10/12/2022   Flu Shot  12/12/2022   Eye exam for diabetics  01/05/2023   Yearly kidney function blood test for diabetes  03/07/2023   Yearly kidney health urinalysis for diabetes  03/07/2023   Medicare Annual Wellness Visit  09/05/2023   Mammogram  05/31/2024   Pap Smear  02/15/2025   Hepatitis C Screening: USPSTF Recommendation to screen - Ages 18-79 yo.  Completed   HIV Screening  Completed   HPV Vaccine  Aged Out  *Topic was postponed. The date shown is not the original due date.    Advanced directives: Discussed with patient  Conditions/risks identified: None  Next appointment: Follow up in one year for your annual wellness visit 10/08/23   Preventive Care 65 Years and Older, Female Preventive care refers to lifestyle choices and visits with your health care provider that can promote health and wellness. What does preventive care include? A yearly physical exam. This is also called an annual well check. Dental exams once or twice a year. Routine eye exams. Ask your health care provider how often you should have your eyes checked. Personal lifestyle choices, including: Daily care of your teeth and gums. Regular  physical activity. Eating a healthy diet. Avoiding tobacco and drug use. Limiting alcohol use. Practicing safe sex. Taking low-dose aspirin every day. Taking vitamin and mineral supplements as recommended by your health care provider. What happens during an annual well check? The services and screenings done by your health care provider during your annual well check will depend on your age, overall health, lifestyle risk factors, and family history of disease. Counseling  Your health care provider may ask you questions about your: Alcohol use. Tobacco use. Drug use. Emotional well-being. Home and relationship well-being. Sexual activity. Eating habits. History of falls. Memory and ability to understand (cognition). Work and work Astronomer. Reproductive health. Screening  You may have the following tests or measurements: Height, weight, and BMI. Blood pressure. Lipid and cholesterol levels. These may be checked every 5 years, or more frequently if you are over 22 years old. Skin check. Lung cancer screening. You may have this screening every year starting at age 64 if you have a 30-pack-year history of smoking and currently smoke or have quit within the past 15 years. Fecal occult blood test (FOBT) of the stool. You may have this test every year starting at age 29. Flexible sigmoidoscopy or colonoscopy. You may have a sigmoidoscopy every 5 years or a colonoscopy every 10 years starting at age 68. Hepatitis C blood test. Hepatitis B blood test.  Sexually transmitted disease (STD) testing. Diabetes screening. This is done by checking your blood sugar (glucose) after you have not eaten for a while (fasting). You may have this done every 1-3 years. Bone density scan. This is done to screen for osteoporosis. You may have this done starting at age 15. Mammogram. This may be done every 1-2 years. Talk to your health care provider about how often you should have regular mammograms. Talk  with your health care provider about your test results, treatment options, and if necessary, the need for more tests. Vaccines  Your health care provider may recommend certain vaccines, such as: Influenza vaccine. This is recommended every year. Tetanus, diphtheria, and acellular pertussis (Tdap, Td) vaccine. You may need a Td booster every 10 years. Zoster vaccine. You may need this after age 10. Pneumococcal 13-valent conjugate (PCV13) vaccine. One dose is recommended after age 62. Pneumococcal polysaccharide (PPSV23) vaccine. One dose is recommended after age 68. Talk to your health care provider about which screenings and vaccines you need and how often you need them. This information is not intended to replace advice given to you by your health care provider. Make sure you discuss any questions you have with your health care provider. Document Released: 05/26/2015 Document Revised: 01/17/2016 Document Reviewed: 02/28/2015 Elsevier Interactive Patient Education  2017 [place] Prevention in the Home Falls can cause injuries. They can happen to people of all ages. There are many things you can do to make your home safe and to help prevent falls. What can I do on the outside of my home? Regularly fix the edges of walkways and driveways and fix any cracks. Remove anything that might make you trip as you walk through a door, such as a raised step or threshold. Trim any bushes or trees on the path to your home. Use bright outdoor lighting. Clear any walking paths of anything that might make someone trip, such as rocks or tools. Regularly check to see if handrails are loose or broken. Make sure that both sides of any steps have handrails. Any raised decks and porches should have guardrails on the edges. Have any leaves, snow, or ice cleared regularly. Use sand or salt on walking paths during winter. Clean up any spills in your garage right away. This includes oil or grease  spills. What can I do in the bathroom? Use night lights. Install grab bars by the toilet and in the tub and shower. Do not use towel bars as grab bars. Use non-skid mats or decals in the tub or shower. If you need to sit down in the shower, use a plastic, non-slip stool. Keep the floor dry. Clean up any water that spills on the floor as soon as it happens. Remove soap buildup in the tub or shower regularly. Attach bath mats securely with double-sided non-slip rug tape. Do not have throw rugs and other things on the floor that can make you trip. What can I do in the bedroom? Use night lights. Make sure that you have a light by your bed that is easy to reach. Do not use any sheets or blankets that are too big for your bed. They should not hang down onto the floor. Have a firm chair that has side arms. You can use this for support while you get dressed. Do not have throw rugs and other things on the floor that can make you trip. What can I do in the kitchen? Clean up any spills right away.  Avoid walking on wet floors. Keep items that you use a lot in easy-to-reach places. If you need to reach something above you, use a strong step stool that has a grab bar. Keep electrical cords out of the way. Do not use floor polish or wax that makes floors slippery. If you must use wax, use non-skid floor wax. Do not have throw rugs and other things on the floor that can make you trip. What can I do with my stairs? Do not leave any items on the stairs. Make sure that there are handrails on both sides of the stairs and use them. Fix handrails that are broken or loose. Make sure that handrails are as long as the stairways. Check any carpeting to make sure that it is firmly attached to the stairs. Fix any carpet that is loose or worn. Avoid having throw rugs at the top or bottom of the stairs. If you do have throw rugs, attach them to the floor with carpet tape. Make sure that you have a light switch at the  top of the stairs and the bottom of the stairs. If you do not have them, ask someone to add them for you. What else can I do to help prevent falls? Wear shoes that: Do not have high heels. Have rubber bottoms. Are comfortable and fit you well. Are closed at the toe. Do not wear sandals. If you use a stepladder: Make sure that it is fully opened. Do not climb a closed stepladder. Make sure that both sides of the stepladder are locked into place. Ask someone to hold it for you, if possible. Clearly mark and make sure that you can see: Any grab bars or handrails. First and last steps. Where the edge of each step is. Use tools that help you move around (mobility aids) if they are needed. These include: Canes. Walkers. Scooters. Crutches. Turn on the lights when you go into a dark area. Replace any light bulbs as soon as they burn out. Set up your furniture so you have a clear path. Avoid moving your furniture around. If any of your floors are uneven, fix them. If there are any pets around you, be aware of where they are. Review your medicines with your doctor. Some medicines can make you feel dizzy. This can increase your chance of falling. Ask your doctor what other things that you can do to help prevent falls. This information is not intended to replace advice given to you by your health care provider. Make sure you discuss any questions you have with your health care provider. Document Released: 02/23/2009 Document Revised: 10/05/2015 Document Reviewed: 06/03/2014 Elsevier Interactive Patient Education  2017 [employer].

## 2022-09-05 NOTE — Progress Notes (Signed)
Subjective:   Kathleen Lucero is a 67 y.o. female who presents for an Initial Medicare Annual Wellness Visit.   I connected with  Cola Gane on 09/05/22 by a audio enabled telemedicine application and verified that I am speaking with the correct person using two identifiers.  Patient Location: Home  Provider Location: Home Office  I discussed the limitations of evaluation and management by telemedicine. The patient expressed understanding and agreed to proceed.       Review of Systems    Cardiac Risk Factors include: advanced age (>6men, >62 women);diabetes mellitus;dyslipidemia;hypertension     Objective:    Today's Vitals   09/05/22 1302  Weight: 206 lb (93.4 kg)  Height: 5\' 7"  (1.702 m)   Body mass index is 32.26 kg/m.     09/05/2022    1:06 PM 08/06/2016    3:00 PM  Advanced Directives  Does Patient Have a Medical Advance Directive? No No  Would patient like information on creating a medical advance directive? No - Patient declined No - Patient declined    Current Medications (verified) Outpatient Encounter Medications as of 09/05/2022  Medication Sig   diltiazem (CARDIZEM CD) 180 MG 24 hr capsule TAKE 1 CAPSULE BY MOUTH EVERY DAY (OCEANSIDE BRAND)   rosuvastatin (CRESTOR) 20 MG tablet TAKE 1 TABLET BY MOUTH EVERY DAY   spironolactone (ALDACTONE) 25 MG tablet Take 1 tablet (25 mg total) by mouth daily.   ACCU-CHEK GUIDE test strip 4 (four) times daily. use as directed (Patient not taking: Reported on 03/14/2022)   Accu-Chek Softclix Lancets lancets 4 (four) times daily. (Patient not taking: Reported on 03/14/2022)   Ascorbic Acid (VITAMIN C) 100 MG tablet Take 100 mg by mouth daily. (Patient not taking: Reported on 03/14/2022)   blood glucose meter kit and supplies KIT Dispense based on patient and insurance preference. Use up to four times daily as directed. (FOR E11.9). (Patient not taking: Reported on 03/14/2022)   Cholecalciferol (VITAMIN D3) 25 MCG  (1000 UT) CAPS Take 1 capsule (1,000 Units total) by mouth daily. (Patient not taking: Reported on 03/14/2022)   Zinc 50 MG TABS  (Patient not taking: Reported on 03/14/2022)   No facility-administered encounter medications on file as of 09/05/2022.    Allergies (verified) Other and Codeine sulfate   History: Past Medical History:  Diagnosis Date   Diabetes mellitus    A1c 6.7 in 3/18   GERD (gastroesophageal reflux disease) 2007   Goiter    Heart murmur    "my whole life"   Hyperlipidemia    Hypertension    Lumbar degenerative disc disease    S/P ruptured disc, Dr. Worthy Rancher   T3 thyrotoxicosis 2011   S/P RAI   Uterine fibroid    Dr Hyacinth Meeker   Past Surgical History:  Procedure Laterality Date   TUBAL LIGATION  05/14/1979   WISDOM TOOTH EXTRACTION     Family History  Problem Relation Age of Onset   Hypertension Mother    Stroke Mother 62       5 days post partum   Alzheimer's disease Mother    Hypertension Brother         2 brothers   Cancer Brother 33       colorectal   Hypertension Maternal Grandmother    Thyroid disease Maternal Grandmother        hypothyroidism   Diabetes Maternal Grandmother        diet controlled   Dementia Maternal Grandmother 54  Alzheimer's disease Maternal Grandmother    Dementia Paternal Aunt    Heart attack Neg Hx    Social History   Socioeconomic History   Marital status: Married    Spouse name: Not on file   Number of children: 3   Years of education: Not on file   Highest education level: Not on file  Occupational History   Occupation: Event organiser: [employer]  Tobacco Use   Smoking status: Never   Smokeless tobacco: Never  Vaping Use   Vaping Use: Never used  Substance and Sexual Activity   Alcohol use: No   Drug use: No   Sexual activity: Yes    Partners: Male    Birth control/protection: Surgical, Post-menopausal    Comment: BTL  Other Topics Concern   Not on file  Social History Narrative    Walks 2X wkly   3 sons, 6 grandchildren   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[place], [place]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   Social Determinants of Health   Financial Resource Strain: Not on file  Food Insecurity: No Food Insecurity (09/05/2022)   Hunger Vital Sign    Worried About Running Out of Food in the Last Year: Never true    Ran Out of Food in the Last Year: Never true  Transportation Needs: No Transportation Needs (09/05/2022)   PRAPARE - Administrator, Civil Service (Medical): No    Lack of Transportation (Non-Medical): No  Physical Activity: Inactive (09/05/2022)   Exercise Vital Sign    Days of Exercise per Week: 0 days    Minutes of Exercise per Session: 0 min  Stress: No Stress Concern Present (09/05/2022)   [employer] of Occupational Health - Occupational Stress Questionnaire    Feeling of Stress : Not at all  Social Connections: Socially Integrated (09/05/2022)   Social Connection and Isolation Panel [NHANES]    Frequency of Communication with Friends and Family: More than three times a week    Frequency of Social Gatherings with Friends and Family: More than three times a week    Attends Religious Services: More than 4 times per year    Active Member of [employer] or Organizations: No    Attends Engineer, structural: 1 to 4 times per year    Marital Status: Married    Tobacco Counseling Counseling given: Not Answered   Clinical Intake:  Pre-visit preparation completed: Yes  Pain : No/denies pain     BMI - recorded: 32.26 Nutritional Risks: None Diabetes: No  How often do you need to have someone help you when you read instructions, pamphlets, or other written materials from your doctor or pharmacy?: 1 - Never  Diabetic?  Yes  Interpreter Needed?: No  Information entered by :: Kandis Cocking, Cma   Activities of Daily Living    09/05/2022    1:06 PM  In your present state of health, do you have any difficulty performing the following activities:  Hearing? 0   Vision? 0  Difficulty concentrating or making decisions? 0  Walking or climbing stairs? 0  Dressing or bathing? 0  Doing errands, shopping? 0  Preparing Food and eating ? N  Using the Toilet? N  In the past six months, have you accidently leaked urine? N  Do you have problems with loss of bowel control? N  Managing your Medications? N  Managing your Finances? N  Housekeeping or managing your Housekeeping? N    Patient Care Team: Pincus Sanes, MD as  PCP - General (Internal Medicine)  Indicate any recent Medical Services you may have received from other than [facility] providers in the past year (date may be approximate).     Assessment:   This is a routine wellness examination for Hawley.  Hearing/Vision screen Hearing Screening - Comments:: Denies hearing difficulties   Vision Screening - Comments:: Last  eye exam in 2023  Dietary issues and exercise activities discussed: Current Exercise Habits: The patient does not participate in regular exercise at present   Goals Addressed             This Visit's Progress    Patient Stated       Keep weight down       Depression Screen    09/05/2022    2:38 PM 03/06/2022    9:43 AM 02/15/2022    8:48 AM 12/04/2021   10:56 AM 10/11/2021   10:23 AM 07/25/2021    7:54 AM 02/09/2021    8:21 AM  PHQ 2/9 Scores  PHQ - 2 Score 0 0 0 0 0 0 0  PHQ- 9 Score  0  3 0      Fall Risk    09/05/2022    2:36 PM 03/06/2022    9:43 AM 12/04/2021   10:56 AM 10/11/2021   10:23 AM 07/25/2021    7:55 AM  Fall Risk   Falls in the past year? 0 0 0 0 0  Number falls in past yr: 0 0 0 0 0  Injury with Fall? 0 0 0 0 0  Risk for fall due to : No Fall Risks No Fall Risks No Fall Risks No Fall Risks No Fall Risks  Follow up Falls prevention discussed Falls evaluation completed Falls evaluation completed Falls evaluation completed Falls evaluation completed    FALL RISK PREVENTION PERTAINING TO THE HOME:  Any stairs in or around the home? Yes  If  so, are there any without handrails? No  Home free of loose throw rugs in walkways, pet beds, electrical cords, etc? Yes  Adequate lighting in your home to reduce risk of falls? Yes   ASSISTIVE DEVICES UTILIZED TO PREVENT FALLS:  Life alert? No  Use of a cane, walker or w/c? No  Grab bars in the bathroom? No  Shower chair or bench in shower? No  Elevated toilet seat or a handicapped toilet? No   TIMED UP AND GO:  Was the test performed? No .         Immunizations Immunization History  Administered Date(s) Administered   PFIZER(Purple Top)SARS-COV-2 Vaccination 07/04/2019, 07/28/2019, 02/28/2020    TDAP status: Due, Education has been provided regarding the importance of this vaccine. Advised may receive this vaccine at local pharmacy or Health Dept. Aware to provide a copy of the vaccination record if obtained from local pharmacy or Health Dept. Verbalized acceptance and understanding.  Flu Vaccine status: Up to date  Pneumonia-Due  Covid-19 vaccine status: Declined, Education has been provided regarding the importance of this vaccine but patient still declined. Advised may receive this vaccine at local pharmacy or Health Dept.or vaccine clinic. Aware to provide a copy of the vaccination record if obtained from local pharmacy or Health Dept. Verbalized acceptance and understanding.  Qualifies for Shingles Vaccine? Yes   Zostavax completed No   Shingrix Completed?: No.    Education has been provided regarding the importance of this vaccine. Patient has been advised to call insurance company to determine out of pocket expense if they have  not yet received this vaccine. Advised may also receive vaccine at local pharmacy or Health Dept. Verbalized acceptance and understanding.  Screening Tests Health Maintenance  Topic Date Due   DTaP/Tdap/Td (1 - Tdap) Never done   Zoster Vaccines- Shingrix (1 of 2) Never done   COLONOSCOPY (Pts 45-51yrs Insurance coverage will need to be  confirmed)  09/30/2021   DEXA SCAN  Never done   COVID-19 Vaccine (4 - 2023-24 season) 01/11/2022   HEMOGLOBIN A1C  09/05/2022   Pneumonia Vaccine 54+ Years old (1 of 1 - PCV) 03/07/2023 (Originally 01/01/2022)   FOOT EXAM  10/12/2022   INFLUENZA VACCINE  12/12/2022   OPHTHALMOLOGY EXAM  01/05/2023   Diabetic kidney evaluation - eGFR measurement  03/07/2023   Diabetic kidney evaluation - Urine ACR  03/07/2023   Medicare Annual Wellness (AWV)  09/05/2023   MAMMOGRAM  05/31/2024   PAP SMEAR-Modifier  02/15/2025   Hepatitis C Screening  Completed   HIV Screening  Completed   HPV VACCINES  Aged Out    Health Maintenance  Health Maintenance Due  Topic Date Due   DTaP/Tdap/Td (1 - Tdap) Never done   Zoster Vaccines- Shingrix (1 of 2) Never done   COLONOSCOPY (Pts 45-65yrs Insurance coverage will need to be confirmed)  09/30/2021   DEXA SCAN  Never done   COVID-19 Vaccine (4 - 2023-24 season) 01/11/2022   HEMOGLOBIN A1C  09/05/2022    Colorectal screening  per patient will discuss with Dr.   Annamarie Major status: Completed  . Repeat every year  Bone Density status: Ordered  . Pt provided with contact info and advised to call to schedule appt.  Lung Cancer Screening: (Low Dose CT Chest recommended if Age 56-80 years, 30 pack-year currently smoking OR have quit w/in 15years.) does not qualify.   Lung Cancer Screening Referral:  N/A  Additional Screening:  Hepatitis C Screening: does qualify; Completed 02/06/2016  Vision Screening: Recommended annual ophthalmology exams for early detection of glaucoma and other disorders of the eye. Is the patient up to date with their annual eye exam?  Yes   Who is the provider or what is the name of the office in which the patient attends annual eye exams? Unknown provider,  exam 2023  If pt is not established with a provider, would they like to be referred to a provider to establish care? No .   Dental Screening: Recommended annual dental exams  for proper oral hygiene  Community Resource Referral / Chronic Care Management: CRR required this visit?  No   CCM required this visit?  No      Plan:     I have personally reviewed and noted the following in the patient's chart:   Medical and social history Use of alcohol, tobacco or illicit drugs  Current medications and supplements including opioid prescriptions. Patient is not currently taking opioid prescriptions. Functional ability and status Nutritional status Physical activity Advanced directives List of other physicians Hospitalizations, surgeries, and ER visits in previous 12 months Vitals Screenings to include cognitive, depression, and falls Referrals and appointments  In addition, I have reviewed and discussed with patient certain preventive protocols, quality metrics, and best practice recommendations. A written personalized care plan for preventive services as well as general preventive health recommendations were provided to patient.     Milus Mallick, CMA   09/05/2022   Nurse Notes: Bone density order placed,   per patient will discuss Tdap , Shingles, and Colonoscopy with provider

## 2022-09-05 NOTE — Progress Notes (Addendum)
Subjective:    Patient ID: Kathleen Lucero, female    DOB: [birth year], 67 y.o.   MRN: [id number]     HPI Kathleen Lucero is here follow up of her chronic medical problems  BP at home - 135-137  She is walking daily for exercise.  She is working on weight loss.    Compliant with diabetic diet.   Medications and allergies reviewed with patient and updated if appropriate.  Current Outpatient Medications on File Prior to Visit  Medication Sig Dispense Refill   rosuvastatin (CRESTOR) 20 MG tablet TAKE 1 TABLET BY MOUTH EVERY DAY 90 tablet 3   spironolactone (ALDACTONE) 25 MG tablet Take 1 tablet (25 mg total) by mouth daily. 90 tablet 3   No current facility-administered medications on file prior to visit.     Review of Systems  Constitutional:  Negative for fever.  Respiratory:  Negative for cough, shortness of breath and wheezing.   Cardiovascular:  Negative for chest pain, palpitations and leg swelling.  Gastrointestinal:  Negative for abdominal pain.       No gerd  Neurological:  Negative for light-headedness and headaches.       Objective:   Vitals:   09/06/22 0747 09/06/22 0816  BP: (!) 140/78 132/74  Pulse: 78   Temp: 98.2 F (36.8 C)   SpO2: 98%    BP Readings from Last 3 Encounters:  09/06/22 132/74  03/14/22 (!) 154/90  03/06/22 132/70   Wt Readings from Last 3 Encounters:  09/06/22 195 lb (88.5 kg)  09/05/22 206 lb (93.4 kg)  03/14/22 206 lb 3.2 oz (93.5 kg)   Body mass index is 30.54 kg/m.    Physical Exam Constitutional:      General: She is not in acute distress.    Appearance: Normal appearance.  HENT:     Head: Normocephalic and atraumatic.  Eyes:     Conjunctiva/sclera: Conjunctivae normal.  Cardiovascular:     Rate and Rhythm: Normal rate and regular rhythm.     Heart sounds: Normal heart sounds.  Pulmonary:     Effort: Pulmonary effort is normal. No respiratory distress.     Breath sounds: Normal breath sounds. No wheezing.   Musculoskeletal:     Cervical back: Neck supple.     Right lower leg: No edema.     Left lower leg: No edema.  Lymphadenopathy:     Cervical: No cervical adenopathy.  Skin:    General: Skin is warm and dry.     Findings: No rash.  Neurological:     Mental Status: She is alert. Mental status is at baseline.  Psychiatric:        Mood and Affect: Mood normal.        Behavior: Behavior normal.        Lab Results  Component Value Date   WBC 6.5 10/11/2021   HGB 13.3 10/11/2021   HCT 41.1 10/11/2021   PLT 197.0 10/11/2021   GLUCOSE 100 (H) 03/06/2022   CHOL 165 03/06/2022   TRIG 66.0 03/06/2022   HDL 52.10 03/06/2022   LDLDIRECT 130.2 11/05/2006   LDLCALC 99 03/06/2022   ALT 17 03/06/2022   AST 19 03/06/2022   NA 139 03/06/2022   K 3.9 03/06/2022   CL 103 03/06/2022   CREATININE 1.01 03/06/2022   BUN 15 03/06/2022   CO2 27 03/06/2022   TSH 2.30 12/22/2020   HGBA1C 6.4 03/06/2022   MICROALBUR <0.7 03/06/2022     Assessment &  Plan:    See Problem List for Assessment and Plan of chronic medical problems.

## 2022-09-06 ENCOUNTER — Encounter: Payer: Self-pay | Admitting: Internal Medicine

## 2022-09-06 ENCOUNTER — Ambulatory Visit (INDEPENDENT_AMBULATORY_CARE_PROVIDER_SITE_OTHER): Payer: Medicare Other | Admitting: Internal Medicine

## 2022-09-06 VITALS — BP 132/74 | HR 78 | Temp 98.2°F | Ht 67.0 in | Wt 195.0 lb

## 2022-09-06 DIAGNOSIS — E119 Type 2 diabetes mellitus without complications: Secondary | ICD-10-CM

## 2022-09-06 DIAGNOSIS — E042 Nontoxic multinodular goiter: Secondary | ICD-10-CM

## 2022-09-06 DIAGNOSIS — I340 Nonrheumatic mitral (valve) insufficiency: Secondary | ICD-10-CM

## 2022-09-06 DIAGNOSIS — E559 Vitamin D deficiency, unspecified: Secondary | ICD-10-CM

## 2022-09-06 DIAGNOSIS — N1831 Chronic kidney disease, stage 3a: Secondary | ICD-10-CM

## 2022-09-06 DIAGNOSIS — E669 Obesity, unspecified: Secondary | ICD-10-CM

## 2022-09-06 DIAGNOSIS — I10 Essential (primary) hypertension: Secondary | ICD-10-CM

## 2022-09-06 DIAGNOSIS — E7849 Other hyperlipidemia: Secondary | ICD-10-CM

## 2022-09-06 DIAGNOSIS — Z0000 Encounter for general adult medical examination without abnormal findings: Secondary | ICD-10-CM

## 2022-09-06 LAB — LIPID PANEL
Cholesterol: 209 mg/dL — ABNORMAL HIGH (ref 0–200)
HDL: 69.8 mg/dL (ref >39.00)
LDL Cholesterol: 126 mg/dL — ABNORMAL HIGH (ref 0–99)
NonHDL: 139.24
Total CHOL/HDL Ratio: 3
Triglycerides: 68 mg/dL (ref 0.0–149.0)
VLDL: 13.6 mg/dL (ref 0.0–40.0)

## 2022-09-06 LAB — CBC WITH DIFFERENTIAL/PLATELET
Basophils Absolute: 0 10*3/uL (ref 0.0–0.1)
Basophils Relative: 0.8 % (ref 0.0–3.0)
Eosinophils Absolute: 0.2 10*3/uL (ref 0.0–0.7)
Eosinophils Relative: 3.7 % (ref 0.0–5.0)
HCT: 44.4 % (ref 36.0–46.0)
Hemoglobin: 14.7 g/dL (ref 12.0–15.0)
Lymphocytes Relative: 37.9 % (ref 12.0–46.0)
Lymphs Abs: 2 10*3/uL (ref 0.7–4.0)
MCHC: 33.1 g/dL (ref 30.0–36.0)
MCV: 83.2 fl (ref 78.0–100.0)
Monocytes Absolute: 0.3 10*3/uL (ref 0.1–1.0)
Monocytes Relative: 6.2 % (ref 3.0–12.0)
Neutro Abs: 2.7 10*3/uL (ref 1.4–7.7)
Neutrophils Relative %: 51.4 % (ref 43.0–77.0)
Platelets: 203 10*3/uL (ref 150.0–400.0)
RBC: 5.34 Mil/uL — ABNORMAL HIGH (ref 3.87–5.11)
RDW: 14.6 % (ref 11.5–15.5)
WBC: 5.3 10*3/uL (ref 4.0–10.5)

## 2022-09-06 LAB — COMPREHENSIVE METABOLIC PANEL
ALT: 21 U/L (ref 0–35)
AST: 21 U/L (ref 0–37)
Albumin: 5.1 g/dL (ref 3.5–5.2)
Alkaline Phosphatase: 103 U/L (ref 39–117)
BUN: 20 mg/dL (ref 6–23)
CO2: 26 mEq/L (ref 19–32)
Calcium: 10.7 mg/dL — ABNORMAL HIGH (ref 8.4–10.5)
Chloride: 101 mEq/L (ref 96–112)
Creatinine, Ser: 0.97 mg/dL (ref 0.40–1.20)
GFR: 61.25 mL/min (ref >60.00)
Glucose, Bld: 100 mg/dL — ABNORMAL HIGH (ref 70–99)
Potassium: 3.9 mEq/L (ref 3.5–5.1)
Sodium: 138 mEq/L (ref 135–145)
Total Bilirubin: 0.9 mg/dL (ref 0.2–1.2)
Total Protein: 8.6 g/dL — ABNORMAL HIGH (ref 6.0–8.3)

## 2022-09-06 LAB — HEMOGLOBIN A1C: Hgb A1c MFr Bld: 6 % (ref 4.6–6.5)

## 2022-09-06 LAB — VITAMIN D 25 HYDROXY (VIT D DEFICIENCY, FRACTURES): VITD: 31.82 ng/mL (ref 30.00–100.00)

## 2022-09-06 MED ORDER — EMPAGLIFLOZIN 10 MG PO TABS: 10.0000 mg | ORAL_TABLET | Freq: Every day | ORAL | 5 refills | Status: DC

## 2022-09-06 MED ORDER — DILTIAZEM HCL ER 240 MG PO TB24: 240.0000 mg | ORAL_TABLET | Freq: Every day | ORAL | 2 refills | Status: DC

## 2022-09-06 NOTE — Assessment & Plan Note (Signed)
Chronic Chronic thyromegaly secondary to goiter-?  More prominent now-this may be related to weight loss Last ultrasound was several years ago Thyroid ultrasound ordered 

## 2022-09-06 NOTE — Assessment & Plan Note (Signed)
Chronic She is working on weight loss Continue regular exercise Stressed healthy diet-small portions, high in protein, vegetables, fiber

## 2022-09-06 NOTE — Assessment & Plan Note (Signed)
Chronic Regular exercise and healthy diet encouraged Check lipid panel  Continue Crestor 20 mg daily 

## 2022-09-06 NOTE — Assessment & Plan Note (Signed)
Chronic Taking vitamin D daily Check vitamin D level  

## 2022-09-06 NOTE — Assessment & Plan Note (Addendum)
Chronic Blood pressure well controlled CMP, CBC Continue diltiazem 180 mg daily, spironolactone 25 mg daily

## 2022-09-06 NOTE — Assessment & Plan Note (Addendum)
Chronic Severe MR Has seen cardiology to discuss further workup and probable surgery Stressed that she needs to undergo further evaluation the progressive nature of the valve disease Has f/u with cardio next month

## 2022-09-06 NOTE — Assessment & Plan Note (Addendum)
Chronic   Lab Results  Component Value Date   HGBA1C 6.4 03/06/2022   Sugars controlled Check A1c Currently controlled with lifestyle Start jardiance 10 mg daily Continue regular exercise, diabetic diet Continue weight loss efforts

## 2022-09-06 NOTE — Assessment & Plan Note (Addendum)
Chronic Mild, stable CMP, CBC Start Jardiance 10 mg daily

## 2022-09-23 NOTE — Progress Notes (Deleted)
Cardiology Office Note:    Date:  09/23/2022   ID:  Kathleen Lucero, DOB Jan 30, 1956, MRN [id number]  PCP:  Pincus Sanes, MD  [facility] Providers Cardiologist:  None { Click to update primary MD,subspecialty MD or APP then REFRESH:1}  *** Referring MD: Pincus Sanes, MD   Chief Complaint:  No chief complaint on file. {Click here for Visit Info    :1}    History of Present Illness:   Kathleen Lucero is a 67 y.o. female with  a hx of HTN, bicuspid aortic valve, severe MR, DMII, CKD IIIA who was referred by Dr. Lawerance Bach for further evaluation of severe MR.   Patient seen by Dr. Lawerance Bach on 03/06/22. Had TTE 02/14/22 with LVEF 65-70%, G1DD, normal RV, normal PASP, moderate LAE, moderate TR, severe, mainly central MR, mild AS.  She saw Dr. Shari Prows 03/2022 and was feeling well and not prepared to proceed with further w/u. Plan for TEE first.               Past Medical History:  Diagnosis Date   Diabetes mellitus (HCC)    A1c 6.7 in 3/18   GERD (gastroesophageal reflux disease) 2007   Goiter    Heart murmur    "my whole life"   Hyperlipidemia    Hypertension    Lumbar degenerative disc disease    S/P ruptured disc, Dr. Worthy Rancher   T3 thyrotoxicosis 2011   S/P RAI   Uterine fibroid    Dr Hyacinth Meeker   Current Medications: No outpatient medications have been marked as taking for the 10/01/22 encounter (Appointment) with Dyann Kief, PA-C.    Allergies:   Other and Codeine sulfate   Social History   Tobacco Use   Smoking status: Never   Smokeless tobacco: Never  Vaping Use   Vaping Use: Never used  Substance Use Topics   Alcohol use: No   Drug use: No    Family Hx: The patient's family history includes Alzheimer's disease in her maternal grandmother and mother; Cancer (age of onset: 31) in her brother; Dementia in her paternal aunt; Dementia (age of onset: 11) in her maternal grandmother; Diabetes in her maternal grandmother; Hypertension in her  brother, maternal grandmother, and mother; Stroke (age of onset: 76) in her mother; Thyroid disease in her maternal grandmother. There is no history of Heart attack.  ROS     Physical Exam:    VS:  LMP 05/14/2007     Wt Readings from Last 3 Encounters:  09/06/22 195 lb (88.5 kg)  09/05/22 206 lb (93.4 kg)  03/14/22 206 lb 3.2 oz (93.5 kg)    Physical Exam  GEN: Well nourished, well developed, in no acute distress  HEENT: normal  Neck: no JVD, carotid bruits, or masses Cardiac:RRR; no murmurs, rubs, or gallops  Respiratory:  clear to auscultation bilaterally, normal work of breathing GI: soft, nontender, nondistended, + BS Ext: without cyanosis, clubbing, or edema, Good distal pulses bilaterally MS: no deformity or atrophy  Skin: warm and dry, no rash Neuro:  Alert and Oriented x 3, Strength and sensation are intact Psych: euthymic mood, full affect        EKGs/Labs/Other Test Reviewed:    EKG:  EKG is *** ordered today.  The ekg ordered today demonstrates ***  Recent Labs: 09/06/2022: ALT 21; BUN 20; Creatinine, Ser 0.97; Hemoglobin 14.7; Platelets 203.0; Potassium 3.9; Sodium 138   Recent Lipid Panel Recent Labs    09/06/22 0819  CHOL 209*  TRIG 68.0  HDL 69.80  VLDL 13.6  LDLCALC 126*     Prior CV Studies: {Select studies to display:26339}  TTE 02/2022: IMPRESSIONS    1. Recommend referral to Cardiology for further evaluation of severe mitral regurgitation.   2. The mitral valve leaflets are mildly thickened. There is severe, mainly central mitral regurgitation likely functional in the setting of atrial dilation. It also appears that both leaflets are attached to a single papillary muscle (clip 49) which may be contributing to her underlying mitral regurgitation. Recommend Cardiology referral, TEE for further evaluation of MR, and CT to better define LV anatomy. The mitral valve is abnormal. Severe mitral valve regurgitation.   3. The aortic valve is tricuspid  (no evidence of bicuspid aortic valve). There is mild calcification of the aortic valve. There is mild thickening of the aortic valve. Aortic valve regurgitation is not visualized. Mild aortic valve stenosis. Aortic valve area, by VTI measures 1.75 cm. Aortic valve mean gradient measures 16.0 mmHg. Aortic valve Vmax measures 2.64 m/s. Elevated mean gradient likely due to high output state.   4. Left ventricular ejection fraction, by estimation, is 65 to 70%. The left ventricle has normal function. The left ventricle has no regional wall motion abnormalities. There is mild concentric left ventricular hypertrophy. Left ventricular diastolic parameters are consistent with Grade I diastolic dysfunction (impaired relaxation).   5. Right ventricular systolic function is normal. The right ventricular size is normal. There is normal pulmonary artery systolic pressure. The estimated right ventricular systolic pressure is 33.7 mmHg.   6. Left atrial size was moderately dilated.   7. Right atrial size was mildly dilated.   8. Tricuspid valve regurgitation is moderate.   9. Aortic dilatation noted. There is borderline dilatation of the ascending aorta, measuring 38 mm.   Comparison(s): Compared to prior echo report, her MR is now severe. Her aortic valve is tricuspid and better visualized on current study with mild aortic stenosis.      Risk Assessment/Calculations/Metrics:   {Does this patient have ATRIAL FIBRILLATION?:[phone]}     No BP recorded.  {Refresh Note OR Click here to enter BP  :1}***    ASSESSMENT & PLAN:   No problem-specific Assessment & Plan notes found for this encounter.   Severe MR: TTE 02/2022 with severe central mitral regurgitation with possible single papillary muscle. Normal PASP and normal RV. Currently, feels well and is euvolemic on exam. States she would like to think about testing further before proceeding. Willing to discuss further at follow-up visit in 1 month. -Would  like to think about testing further before proceeding -Willing to follow-up in 1 month to discuss further; ideally would start with a TEE. Did not discuss CT at this visit but may consider to better define anatomy pending TEE   #HTN: Very elevated in clinic but states it is much better controlled at home. She wishes to continue to monitor at this time and will let us or Dr. Lawerance Bach know if running high. -Continue dilt 180mg  Lucero -Continue spiro 25mg  Lucero   #HLD: -Continue crestor 20mg  Lucero -LDL 99   #Mild AS: #Moderate TR: AVA 1.75cm2, mean gradient and moderate TR. -Continue serial monitoring   #Ascending aortic dilation: TTE showed ascending aortic dilation at 38mm. -Continue serial monitoring with yearly echoes             {Are you ordering a CV Procedure (e.g. stress test, cath, DCCV, TEE, etc)?   Press F2        :  [id number]}   Dispo:  No follow-ups on file.   Medication Adjustments/Labs and Tests Ordered: Current medicines are reviewed at length with the patient today.  Concerns regarding medicines are outlined above.  Tests Ordered: No orders of the defined types were placed in this encounter.  Medication Changes: No orders of the defined types were placed in this encounter.  Signed, Jacolyn Reedy, PA-C  09/23/2022 3:38 PM    [facility] [address] [place], [place], [place]  [address] Phone: [phone]; Fax: [phone]

## 2022-10-01 ENCOUNTER — Ambulatory Visit: Payer: Medicare Other | Admitting: Physician Assistant

## 2022-11-28 ENCOUNTER — Ambulatory Visit: Payer: Medicare Other | Admitting: Cardiology

## 2023-02-14 ENCOUNTER — Ambulatory Visit: Payer: Medicare Other | Admitting: Cardiovascular Disease

## 2023-02-21 ENCOUNTER — Other Ambulatory Visit: Payer: Self-pay | Admitting: Internal Medicine

## 2023-03-10 ENCOUNTER — Ambulatory Visit: Payer: Medicare Other | Admitting: Internal Medicine

## 2023-03-31 ENCOUNTER — Inpatient Hospital Stay
Admission: RE | Admit: 2023-03-31 | Discharge: 2023-03-31 | Disposition: A | Discharge status: Home / Self Care | Payer: Medicare Other | Source: Ambulatory Visit | Attending: Internal Medicine

## 2023-03-31 DIAGNOSIS — Z780 Asymptomatic menopausal state: Secondary | ICD-10-CM

## 2023-04-18 ENCOUNTER — Ambulatory Visit: Payer: Medicare Other | Admitting: Internal Medicine

## 2023-05-25 ENCOUNTER — Encounter: Payer: Self-pay | Admitting: Internal Medicine

## 2023-05-25 NOTE — Progress Notes (Signed)
 Subjective:    Patient ID: Kathleen Lucero, female    DOB: Mar 11, 1956, 68 y.o.   MRN: [id number]     HPI Kathleen Lucero is here for follow up of her chronic medical problems.  BP at home 132/79 ish  Not exercising much - wants to avoid the gym with the illness going around and it is too cold outside to walk.    Doing well - no concerns.   Medications and allergies reviewed with patient and updated if appropriate.  Current Outpatient Medications on File Prior to Visit  Medication Sig Dispense Refill   diltiazem  (CARDIZEM  LA) 240 MG 24 hr tablet Take 1 tablet (240 mg total) by mouth daily. 90 tablet 2   empagliflozin  (JARDIANCE ) 10 MG TABS tablet Take 1 tablet (10 mg total) by mouth daily before breakfast. 30 tablet 5   rosuvastatin  (CRESTOR ) 20 MG tablet TAKE 1 TABLET BY MOUTH EVERY DAY 90 tablet 3   spironolactone  (ALDACTONE ) 25 MG tablet Take 1 tablet (25 mg total) by mouth daily. 90 tablet 3   No current facility-administered medications on file prior to visit.     Review of Systems  Constitutional:  Negative for fever.  Respiratory:  Negative for cough, shortness of breath and wheezing.   Cardiovascular:  Negative for chest pain, palpitations and leg swelling.  Neurological:  Negative for light-headedness and headaches.       Objective:   Vitals:   05/28/23 0830 05/28/23 0914  BP: (!) 140/80 132/72  Pulse: 64   Temp: 98 F (36.7 C)   SpO2: 97%    BP Readings from Last 3 Encounters:  05/28/23 132/72  09/06/22 132/74  03/14/22 (!) 154/90   Wt Readings from Last 3 Encounters:  05/28/23 198 lb (89.8 kg)  09/06/22 195 lb (88.5 kg)  09/05/22 206 lb (93.4 kg)   Body mass index is 31.01 kg/m.    Physical Exam Constitutional:      General: She is not in acute distress.    Appearance: Normal appearance.  HENT:     Head: Normocephalic and atraumatic.  Eyes:     Conjunctiva/sclera: Conjunctivae normal.  Cardiovascular:     Rate and Rhythm: Normal rate  and regular rhythm.     Heart sounds: Normal heart sounds.  Pulmonary:     Effort: Pulmonary effort is normal. No respiratory distress.     Breath sounds: Normal breath sounds. No wheezing.  Musculoskeletal:     Cervical back: Neck supple.     Right lower leg: No edema.     Left lower leg: No edema.  Lymphadenopathy:     Cervical: No cervical adenopathy.  Skin:    General: Skin is warm and dry.     Findings: No rash.  Neurological:     Mental Status: She is alert. Mental status is at baseline.  Psychiatric:        Mood and Affect: Mood normal.        Behavior: Behavior normal.        Lab Results  Component Value Date   WBC 5.3 09/06/2022   HGB 14.7 09/06/2022   HCT 44.4 09/06/2022   PLT 203.0 09/06/2022   GLUCOSE 100 (H) 09/06/2022   CHOL 209 (H) 09/06/2022   TRIG 68.0 09/06/2022   HDL 69.80 09/06/2022   LDLDIRECT 130.2 11/05/2006   LDLCALC 126 (H) 09/06/2022   ALT 21 09/06/2022   AST 21 09/06/2022   NA 138 09/06/2022   K 3.9 09/06/2022  CL 101 09/06/2022   CREATININE 0.97 09/06/2022   BUN 20 09/06/2022   CO2 26 09/06/2022   TSH 2.30 12/22/2020   HGBA1C 6.0 09/06/2022   MICROALBUR <0.7 03/06/2022     Assessment & Plan:    See Problem List for Assessment and Plan of chronic medical problems.

## 2023-05-25 NOTE — Patient Instructions (Addendum)
      Blood work was ordered.       Medications changes include :   None    A referral was ordered for cardiology and someone will call you to schedule an appointment.   A thyroid  ultrasound was ordered.    Return in about 6 months (around 11/25/2023) for Physical Exam.

## 2023-05-28 ENCOUNTER — Ambulatory Visit (INDEPENDENT_AMBULATORY_CARE_PROVIDER_SITE_OTHER): Payer: Medicare Other | Admitting: Internal Medicine

## 2023-05-28 VITALS — BP 132/72 | HR 64 | Temp 98.0°F | Ht 67.0 in | Wt 198.0 lb

## 2023-05-28 DIAGNOSIS — Z7984 Long term (current) use of oral hypoglycemic drugs: Secondary | ICD-10-CM

## 2023-05-28 DIAGNOSIS — I10 Essential (primary) hypertension: Secondary | ICD-10-CM | POA: Diagnosis not present

## 2023-05-28 DIAGNOSIS — E669 Obesity, unspecified: Secondary | ICD-10-CM

## 2023-05-28 DIAGNOSIS — E7849 Other hyperlipidemia: Secondary | ICD-10-CM | POA: Diagnosis not present

## 2023-05-28 DIAGNOSIS — N1831 Chronic kidney disease, stage 3a: Secondary | ICD-10-CM

## 2023-05-28 DIAGNOSIS — E559 Vitamin D deficiency, unspecified: Secondary | ICD-10-CM

## 2023-05-28 DIAGNOSIS — I340 Nonrheumatic mitral (valve) insufficiency: Secondary | ICD-10-CM | POA: Diagnosis not present

## 2023-05-28 DIAGNOSIS — E042 Nontoxic multinodular goiter: Secondary | ICD-10-CM | POA: Diagnosis not present

## 2023-05-28 DIAGNOSIS — E119 Type 2 diabetes mellitus without complications: Secondary | ICD-10-CM

## 2023-05-28 LAB — COMPREHENSIVE METABOLIC PANEL
ALT: 17 U/L (ref 0–35)
AST: 18 U/L (ref 0–37)
Albumin: 5.1 g/dL (ref 3.5–5.2)
Alkaline Phosphatase: 103 U/L (ref 39–117)
BUN: 15 mg/dL (ref 6–23)
CO2: 25 meq/L (ref 19–32)
Calcium: 10.7 mg/dL — ABNORMAL HIGH (ref 8.4–10.5)
Chloride: 104 meq/L (ref 96–112)
Creatinine, Ser: 0.92 mg/dL (ref 0.40–1.20)
GFR: 64.93 mL/min (ref >60.00)
Glucose, Bld: 92 mg/dL (ref 70–99)
Potassium: 4 meq/L (ref 3.5–5.1)
Sodium: 141 meq/L (ref 135–145)
Total Bilirubin: 0.9 mg/dL (ref 0.2–1.2)
Total Protein: 8.1 g/dL (ref 6.0–8.3)

## 2023-05-28 LAB — CBC WITH DIFFERENTIAL/PLATELET
Basophils Absolute: 0 10*3/uL (ref 0.0–0.1)
Basophils Relative: 0.6 % (ref 0.0–3.0)
Eosinophils Absolute: 0.2 10*3/uL (ref 0.0–0.7)
Eosinophils Relative: 3.5 % (ref 0.0–5.0)
HCT: 45.8 % (ref 36.0–46.0)
Hemoglobin: 14.8 g/dL (ref 12.0–15.0)
Lymphocytes Relative: 34.6 % (ref 12.0–46.0)
Lymphs Abs: 1.7 10*3/uL (ref 0.7–4.0)
MCHC: 32.4 g/dL (ref 30.0–36.0)
MCV: 85.2 fL (ref 78.0–100.0)
Monocytes Absolute: 0.3 10*3/uL (ref 0.1–1.0)
Monocytes Relative: 6 % (ref 3.0–12.0)
Neutro Abs: 2.8 10*3/uL (ref 1.4–7.7)
Neutrophils Relative %: 55.3 % (ref 43.0–77.0)
Platelets: 180 10*3/uL (ref 150.0–400.0)
RBC: 5.38 Mil/uL — ABNORMAL HIGH (ref 3.87–5.11)
RDW: 13.7 % (ref 11.5–15.5)
WBC: 5 10*3/uL (ref 4.0–10.5)

## 2023-05-28 LAB — LIPID PANEL
Cholesterol: 199 mg/dL (ref 0–200)
HDL: 63.8 mg/dL (ref >39.00)
LDL Cholesterol: 122 mg/dL — ABNORMAL HIGH (ref 0–99)
NonHDL: 134.93
Total CHOL/HDL Ratio: 3
Triglycerides: 65 mg/dL (ref 0.0–149.0)
VLDL: 13 mg/dL (ref 0.0–40.0)

## 2023-05-28 LAB — MICROALBUMIN / CREATININE URINE RATIO
Creatinine,U: 41.5 mg/dL
Microalb Creat Ratio: 2 mg/g (ref 0.0–30.0)
Microalb, Ur: 0.8 mg/dL (ref 0.0–1.9)

## 2023-05-28 LAB — TSH: TSH: 1.66 u[IU]/mL (ref 0.35–5.50)

## 2023-05-28 LAB — VITAMIN D 25 HYDROXY (VIT D DEFICIENCY, FRACTURES): VITD: 27.94 ng/mL — ABNORMAL LOW (ref 30.00–100.00)

## 2023-05-28 LAB — HEMOGLOBIN A1C: Hgb A1c MFr Bld: 6.2 % (ref 4.6–6.5)

## 2023-05-28 NOTE — Assessment & Plan Note (Signed)
Chronic She is working on weight loss Continue regular exercise Stressed healthy diet-small portions, high in protein, vegetables, fiber

## 2023-05-28 NOTE — Assessment & Plan Note (Addendum)
 Chronic Blood pressure initially elevated but controlled on repeat Low 130's/high 70's at home CMP, CBC Continue diltiazem  240 mg daily, spironolactone  25 mg daily Stress healthy diet and regular exercise - has not been exercising this month - advised doing videos at home if she wants to avoid the gym and it is too cold outside to walk

## 2023-05-28 NOTE — Assessment & Plan Note (Signed)
 Has had elevated calcium  levels Check intact PTH, calcium 

## 2023-05-28 NOTE — Assessment & Plan Note (Signed)
 Chronic Regular exercise and healthy diet encouraged Check lipid panel, CMP Continue Crestor 20 mg daily

## 2023-05-28 NOTE — Assessment & Plan Note (Addendum)
 Chronic  Lab Results  Component Value Date   HGBA1C 6.0 09/06/2022   Sugars controlled Check A1c, urine microalbumin Currently controlled with lifestyle Continue jardiance  10 mg daily Continue regular exercise, diabetic diet Continue weight loss efforts

## 2023-05-28 NOTE — Assessment & Plan Note (Signed)
 Chronic Taking vitamin D daily Check vitamin D level

## 2023-05-28 NOTE — Assessment & Plan Note (Addendum)
 Chronic Denies any changes in thryoid Last US  was a while ago - nodules had shrunk at that time US  ordered last year but never got done --- will reorder US  today -- if stable does not need further follow up tsh

## 2023-05-28 NOTE — Assessment & Plan Note (Signed)
 Chronic Severe MR - asymptomatic Has see cardio but needs to reestablish with someone new Referral ordered - advised she needs to see someone even though she is feeling well Stressed good BP control

## 2023-05-28 NOTE — Assessment & Plan Note (Addendum)
 Chronic Mild, stable CMP, CBC Continue Jardiance  10 mg daily

## 2023-05-30 LAB — PTH, INTACT AND CALCIUM
Calcium: 10.6 mg/dL — ABNORMAL HIGH (ref 8.6–10.4)
PTH: 15 pg/mL — ABNORMAL LOW (ref 16–77)

## 2023-06-01 NOTE — Addendum Note (Signed)
Addended by: Pincus Sanes on: 06/01/2023 11:04 AM   Modules accepted: Orders

## 2023-06-13 DIAGNOSIS — Z1231 Encounter for screening mammogram for malignant neoplasm of breast: Secondary | ICD-10-CM | POA: Diagnosis not present

## 2023-06-24 ENCOUNTER — Encounter (HOSPITAL_BASED_OUTPATIENT_CLINIC_OR_DEPARTMENT_OTHER): Payer: Self-pay | Admitting: Obstetrics & Gynecology

## 2023-07-23 ENCOUNTER — Other Ambulatory Visit: Payer: Self-pay | Admitting: Internal Medicine

## 2023-08-23 ENCOUNTER — Other Ambulatory Visit: Payer: Self-pay | Admitting: Internal Medicine

## 2023-11-10 ENCOUNTER — Other Ambulatory Visit: Payer: Self-pay | Admitting: Internal Medicine

## 2023-11-25 ENCOUNTER — Ambulatory Visit (INDEPENDENT_AMBULATORY_CARE_PROVIDER_SITE_OTHER)

## 2023-11-25 VITALS — Ht 67.0 in | Wt 198.0 lb

## 2023-11-25 DIAGNOSIS — E119 Type 2 diabetes mellitus without complications: Secondary | ICD-10-CM | POA: Diagnosis not present

## 2023-11-25 DIAGNOSIS — K635 Polyp of colon: Secondary | ICD-10-CM | POA: Diagnosis not present

## 2023-11-25 DIAGNOSIS — Z0000 Encounter for general adult medical examination without abnormal findings: Secondary | ICD-10-CM

## 2023-11-25 NOTE — Progress Notes (Signed)
 Subjective:  Please attest and cosign this visit due to patients primary care provider not being in the office at the time the visit was completed.  (Pt of Dr Glade Hope)   Kathleen Lucero is a 68 y.o. who presents for a Medicare Wellness preventive visit.  As a reminder, Annual Wellness Visits don't include a physical exam, and some assessments may be limited, especially if this visit is performed virtually. We may recommend an in-person follow-up visit with your provider if needed.  Visit Complete: Virtual I connected with  Kathleen Lucero on 11/25/23 by a audio enabled telemedicine application and verified that I am speaking with the correct person using two identifiers.  Patient Location: Home  Provider Location: Office/Clinic  I discussed the limitations of evaluation and management by telemedicine. The patient expressed understanding and agreed to proceed.  Vital Signs: Because this visit was a virtual/telehealth visit, some criteria may be missing or patient reported. Any vitals not documented were not able to be obtained and vitals that have been documented are patient reported.  VideoDeclined- This patient declined Librarian, academic. Therefore the visit was completed with audio only.  Persons Participating in Visit: Patient.  AWV Questionnaire: No: Patient Medicare AWV questionnaire was not completed prior to this visit.  Cardiac Risk Factors include: advanced age (>37men, >45 women);dyslipidemia;diabetes mellitus;hypertension;obesity (BMI >30kg/m2)     Objective:    Today's Vitals   11/25/23 0813  Weight: 198 lb (89.8 kg)  Height: 5' 7 (1.702 m)   Body mass index is 31.01 kg/m.     11/25/2023    8:12 AM 09/05/2022    1:06 PM 08/06/2016    3:00 PM  Advanced Directives  Does Patient Have a Medical Advance Directive? No No No   Would patient like information on creating a medical advance directive? Yes (MAU/Ambulatory/Procedural  Areas - Information given) No - Patient declined No - Patient declined      Data saved with a previous flowsheet row definition    Current Medications (verified) Outpatient Encounter Medications as of 11/25/2023  Medication Sig   diltiazem  (CARDIZEM  LA) 240 MG 24 hr tablet TAKE 1 TABLET BY MOUTH EVERY DAY   empagliflozin  (JARDIANCE ) 10 MG TABS tablet Take 1 tablet (10 mg total) by mouth daily before breakfast.   rosuvastatin  (CRESTOR ) 20 MG tablet TAKE 1 TABLET BY MOUTH EVERY DAY   spironolactone  (ALDACTONE ) 25 MG tablet Take 1 tablet by mouth once daily   No facility-administered encounter medications on file as of 11/25/2023.    Allergies (verified) Other and Codeine sulfate   History: Past Medical History:  Diagnosis Date   Diabetes mellitus (HCC)    A1c 6.7 in 3/18   GERD (gastroesophageal reflux disease) 2007   Goiter    Heart murmur    my whole life   Hyperlipidemia    Hypertension    Lumbar degenerative disc disease    S/P ruptured disc, Dr. Renay Heading   T3 thyrotoxicosis 2011   S/P RAI   Uterine fibroid    Dr Cleotilde   Past Surgical History:  Procedure Laterality Date   TUBAL LIGATION  05/14/1979   WISDOM TOOTH EXTRACTION     Family History  Problem Relation Age of Onset   Hypertension Mother    Stroke Mother 42       5 days post partum   Alzheimer's disease Mother    Hypertension Brother         2 brothers   Cancer  Brother 101       colorectal   Hypertension Maternal Grandmother    Thyroid  disease Maternal Grandmother        hypothyroidism   Diabetes Maternal Grandmother        diet controlled   Dementia Maternal Grandmother 71   Alzheimer's disease Maternal Grandmother    Dementia Paternal Aunt    Heart attack Neg Hx    Social History   Socioeconomic History   Marital status: Married    Spouse name: Not on file   Number of children: 3   Years of education: Not on file   Highest education level: Not on file  Occupational History    Occupation: Event organiser: [employer]  Tobacco Use   Smoking status: Never   Smokeless tobacco: Never  Vaping Use   Vaping status: Never Used  Substance and Sexual Activity   Alcohol use: No   Drug use: No   Sexual activity: Yes    Partners: Male    Birth control/protection: Surgical, Post-menopausal    Comment: BTL  Other Topics Concern   Not on file  Social History Narrative   Walks 2X wkly   3 sons, 6 grandchildren   [place] from [place], [place]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   Social Drivers of Corporate investment banker Strain: Low Risk  (11/25/2023)   Overall Financial Resource Strain (CARDIA)    Difficulty of Paying Living Expenses: Not hard at all  Food Insecurity: No Food Insecurity (11/25/2023)   Hunger Vital Sign    Worried About Running Out of Food in the Last Year: Never true    Ran Out of Food in the Last Year: Never true  Transportation Needs: No Transportation Needs (11/25/2023)   PRAPARE - Administrator, Civil Service (Medical): No    Lack of Transportation (Non-Medical): No  Physical Activity: Sufficiently Active (11/25/2023)   Exercise Vital Sign    Days of Exercise per Week: 6 days    Minutes of Exercise per Session: 40 min  Stress: No Stress Concern Present (11/25/2023)   [employer] of Occupational Health - Occupational Stress Questionnaire    Feeling of Stress: Not at all  Social Connections: Socially Integrated (11/25/2023)   Social Connection and Isolation Panel    Frequency of Communication with Friends and Family: More than three times a week    Frequency of Social Gatherings with Friends and Family: More than three times a week    Attends Religious Services: More than 4 times per year    Active Member of [employer] or Organizations: No    Attends Engineer, structural: 1 to 4 times per year    Marital Status: Married    Tobacco Counseling Counseling given: No    Clinical Intake:  Pre-visit preparation completed: Yes  Pain  : No/denies pain     BMI - recorded: 31.01 Nutritional Status: BMI > 30  Obese Nutritional Risks: None Diabetes: Yes CBG done?: No Did pt. bring in CBG monitor from home?: No  Lab Results  Component Value Date   HGBA1C 6.2 05/28/2023   HGBA1C 6.0 09/06/2022   HGBA1C 6.4 03/06/2022     How often do you need to have someone help you when you read instructions, pamphlets, or other written materials from your doctor or pharmacy?: 1 - Never  Interpreter Needed?: No  Information entered by :: Verdie Saba, CMA   Activities of Daily Living     11/25/2023  8:16 AM  In your present state of health, do you have any difficulty performing the following activities:  Hearing? 0  Vision? 0  Difficulty concentrating or making decisions? 0  Walking or climbing stairs? 0  Dressing or bathing? 0  Doing errands, shopping? 0  Preparing Food and eating ? N  Using the Toilet? N  In the past six months, have you accidently leaked urine? N  Do you have problems with loss of bowel control? N  Managing your Medications? N  Managing your Finances? N  Housekeeping or managing your Housekeeping? N    Patient Care Team: Geofm Glade PARAS, MD as PCP - General (Internal Medicine)  I have updated your Care Teams any recent Medical Services you may have received from other providers in the past year.     Assessment:   This is a routine wellness examination for [place].  Hearing/Vision screen Hearing Screening - Comments:: Denies hearing difficulties   Vision Screening - Comments:: Wears eyeglasses for reading only - pt plans to schedule an appt   Goals Addressed               This Visit's Progress     Patient Stated (pt-stated)        Patient stated she's been walking more and watching diet       Depression Screen     11/25/2023    8:17 AM 05/28/2023    8:35 AM 09/06/2022    7:56 AM 09/06/2022    7:55 AM 09/05/2022    2:38 PM 03/06/2022    9:43 AM 02/15/2022    8:48 AM  PHQ  2/9 Scores  PHQ - 2 Score 0 0 0 0 0 0 0  PHQ- 9 Score 0  0   0     Fall Risk     11/25/2023    8:17 AM 05/28/2023    8:35 AM 09/06/2022    7:55 AM 09/05/2022    2:36 PM 03/06/2022    9:43 AM  Fall Risk   Falls in the past year? 0 0 0 0 0  Number falls in past yr: 0 0 0 0 0  Injury with Fall? 0 0 0 0 0  Risk for fall due to : No Fall Risks No Fall Risks No Fall Risks No Fall Risks No Fall Risks  Follow up Falls evaluation completed;Falls prevention discussed Falls evaluation completed Falls evaluation completed Falls prevention discussed Falls evaluation completed      Data saved with a previous flowsheet row definition    MEDICARE RISK AT HOME:  Medicare Risk at Home Any stairs in or around the home?: Yes If so, are there any without handrails?: No Home free of loose throw rugs in walkways, pet beds, electrical cords, etc?: Yes Adequate lighting in your home to reduce risk of falls?: Yes Life alert?: No Use of a cane, walker or w/c?: No Grab bars in the bathroom?: No Shower chair or bench in shower?: No Elevated toilet seat or a handicapped toilet?: No  TIMED UP AND GO:  Was the test performed?  No  Cognitive Function: 6CIT completed        11/25/2023    8:22 AM 09/05/2022    2:52 PM  6CIT Screen  What Year? 0 points --  What month? 0 points --  What time? 0 points --  Count back from 20 0 points --  Months in reverse 0 points --  Repeat phrase 0 points --  Total  Score 0 points     Immunizations Immunization History  Administered Date(s) Administered   PFIZER(Purple Top)SARS-COV-2 Vaccination 07/04/2019, 07/28/2019, 02/28/2020    Screening Tests Health Maintenance  Topic Date Due   Zoster Vaccines- Shingrix (1 of 2) Never done   Diabetic kidney evaluation - Urine ACR  02/22/2012   Colonoscopy  09/30/2021   FOOT EXAM  10/12/2022   OPHTHALMOLOGY EXAM  01/05/2023   COVID-19 Vaccine (4 - 2024-25 season) 01/12/2023   HEMOGLOBIN A1C  11/25/2023    DTaP/Tdap/Td (1 - Tdap) 05/27/2024 (Originally 01/02/1975)   Pneumococcal Vaccine: 50+ Years (1 of 2 - PCV) 05/27/2024 (Originally 01/02/1976)   INFLUENZA VACCINE  12/12/2023   Diabetic kidney evaluation - eGFR measurement  05/27/2024   Medicare Annual Wellness (AWV)  11/24/2024   MAMMOGRAM  06/12/2025   DEXA SCAN  03/30/2028   Hepatitis C Screening  Completed   Hepatitis B Vaccines  Aged Out   HPV VACCINES  Aged Out   Meningococcal B Vaccine  Aged Out    Health Maintenance  Health Maintenance Due  Topic Date Due   Zoster Vaccines- Shingrix (1 of 2) Never done   Diabetic kidney evaluation - Urine ACR  02/22/2012   Colonoscopy  09/30/2021   FOOT EXAM  10/12/2022   OPHTHALMOLOGY EXAM  01/05/2023   COVID-19 Vaccine (4 - 2024-25 season) 01/12/2023   HEMOGLOBIN A1C  11/25/2023   Health Maintenance Items Addressed:  Referral sent to GI for colonoscopy, Labs Ordered: Hemoglogin A1C and Diabetic Kidney Urine ACR  Additional Screening:  Vision Screening: Recommended annual ophthalmology exams for early detection of glaucoma and other disorders of the eye. Would you like a referral to an eye doctor? No  The patient stated that she plans to schedule an appt w/an Ophthalmologist herself for 2025.  Dental Screening: Recommended annual dental exams for proper oral hygiene  Community Resource Referral / Chronic Care Management: CRR required this visit?  No   CCM required this visit?  No   Plan:    I have personally reviewed and noted the following in the patient's chart:   Medical and social history Use of alcohol, tobacco or illicit drugs  Current medications and supplements including opioid prescriptions. Patient is not currently taking opioid prescriptions. Functional ability and status Nutritional status Physical activity Advanced directives List of other physicians Hospitalizations, surgeries, and ER visits in previous 12 months Vitals Screenings to include cognitive,  depression, and falls Referrals and appointments  In addition, I have reviewed and discussed with patient certain preventive protocols, quality metrics, and best practice recommendations. A written personalized care plan for preventive services as well as general preventive health recommendations were provided to patient.   Verdie CHRISTELLA Saba, CMA   11/25/2023   After Visit Summary: (MyChart) Due to this being a telephonic visit, the after visit summary with patients personalized plan was offered to patient via MyChart   Notes: Nothing significant to report at this time.

## 2023-11-25 NOTE — Patient Instructions (Signed)
 Ms. Yaden , Thank you for taking time out of your busy schedule to complete your Annual Wellness Visit with me. I enjoyed our conversation and look forward to speaking with you again next year. I, as well as your care team,  appreciate your ongoing commitment to your health goals. Please review the following plan we discussed and let me know if I can assist you in the future. Your Game plan/ To Do List    Referrals: If you haven't heard from the office you've been referred to, please reach out to them at the phone provided.  Referral to Dr Kristie (GI) for a repeat Colonoscopy; Ordered a Hemoglobin A1C and Diabetic Kidney Urine ACR (Lab) Follow up Visits: Next Medicare AWV with our clinical staff: 12/02/2024   Have you seen your provider in the last 6 months (3 months if uncontrolled diabetes)? Yes Next Office Visit with your provider: 01/09/2024  Clinician Recommendations:  Aim for 30 minutes of exercise or brisk walking, 6-8 glasses of water, and 5 servings of fruits and vegetables each day. Educated and advised on getting the Shingles vaccines in 2025.      This is a list of the screening recommended for you and due dates:  Health Maintenance  Topic Date Due   Zoster (Shingles) Vaccine (1 of 2) Never done   Yearly kidney health urinalysis for diabetes  02/22/2012   Colon Cancer Screening  09/30/2021   Complete foot exam   10/12/2022   Eye exam for diabetics  01/05/2023   COVID-19 Vaccine (4 - 2024-25 season) 01/12/2023   Hemoglobin A1C  11/25/2023   DTaP/Tdap/Td vaccine (1 - Tdap) 05/27/2024*   Pneumococcal Vaccine for age over 71 (1 of 2 - PCV) 05/27/2024*   Flu Shot  12/12/2023   Yearly kidney function blood test for diabetes  05/27/2024   Medicare Annual Wellness Visit  11/24/2024   Mammogram  06/12/2025   DEXA scan (bone density measurement)  03/30/2028   Hepatitis C Screening  Completed   Hepatitis B Vaccine  Aged Out   HPV Vaccine  Aged Out   Meningitis B Vaccine  Aged Out   *Topic was postponed. The date shown is not the original due date.    Advanced directives: (Provided) Advance directive discussed with you today. I have provided a copy for you to complete at home and have notarized. Once this is complete, please bring a copy in to our office so we can scan it into your chart.  Advance Care Planning is important because it:  [x]  Makes sure you receive the medical care that is consistent with your values, goals, and preferences  [x]  It provides guidance to your family and loved ones and reduces their decisional burden about whether or not they are making the right decisions based on your wishes.  Follow the link provided in your after visit summary or read over the paperwork we have mailed to you to help you started getting your Advance Directives in place. If you need assistance in completing these, please reach out to us  so that we can help you!

## 2023-12-01 ENCOUNTER — Ambulatory Visit (INDEPENDENT_AMBULATORY_CARE_PROVIDER_SITE_OTHER): Admitting: Family Medicine

## 2023-12-01 ENCOUNTER — Encounter: Payer: Self-pay | Admitting: Family Medicine

## 2023-12-01 VITALS — BP 148/82 | HR 73 | Temp 98.8°F | Resp 20 | Ht 67.0 in | Wt 195.0 lb

## 2023-12-01 DIAGNOSIS — U071 COVID-19: Secondary | ICD-10-CM

## 2023-12-01 LAB — POC COVID19 BINAXNOW: SARS Coronavirus 2 Ag: POSITIVE — AB

## 2023-12-01 MED ORDER — LEVOCETIRIZINE DIHYDROCHLORIDE 5 MG PO TABS: 5.0000 mg | ORAL_TABLET | Freq: Every evening | ORAL | 0 refills | Status: DC

## 2023-12-01 MED ORDER — FLUTICASONE PROPIONATE 50 MCG/ACT NA SUSP: 2.0000 | Freq: Every day | NASAL | 0 refills | Status: DC

## 2023-12-01 NOTE — Progress Notes (Signed)
 Assessment & Plan:  1. COVID-19 (Primary) Education provided on COVID-19.  Encouraged symptom management.  Discussed she is outside the window for treatment with Paxlovid. - POC COVID-19 BinaxNow - fluticasone  (FLONASE ) 50 MCG/ACT nasal spray; Place 2 sprays into both nostrils daily.  Dispense: 16 g; Refill: 0 - levocetirizine (XYZAL ) 5 MG tablet; Take 1 tablet (5 mg total) by mouth every evening.  Dispense: 30 tablet; Refill: 0  Results for orders placed or performed in visit on 12/01/23  POC COVID-19 BinaxNow  Result Value Ref Range   SARS Coronavirus 2 Ag Positive (A) Negative    Follow up plan: Return if symptoms worsen or fail to improve.  Niki Rung, MSN, APRN, FNP-C  Subjective:  HPI: Kathleen Lucero is a 68 y.o. female presenting on 12/01/2023 for Nasal Congestion (Nasal and head congestion- clear, sneezing, no cough, no ST, no ear pain, no fever /Started last Sunday a week ago )  Patient complains of head congestion, runny nose, and sneezing. She denies cough, sore throat, ear pain/pressure, fever, shortness of breath, and wheezing. Onset of symptoms was 8 days ago, gradually worsening since that time. She is drinking plenty of fluids (hibiscus tea and water). Evaluation to date: none. Treatment to date: xyzam, Coricidin. She does not smoke.    ROS: Negative unless specifically indicated above in HPI.   Relevant past medical history reviewed and updated as indicated.   Allergies and medications reviewed and updated.   Current Outpatient Medications:    diltiazem  (CARDIZEM  LA) 240 MG 24 hr tablet, TAKE 1 TABLET BY MOUTH EVERY DAY, Disp: 90 tablet, Rfl: 2   rosuvastatin  (CRESTOR ) 20 MG tablet, TAKE 1 TABLET BY MOUTH EVERY DAY, Disp: 90 tablet, Rfl: 3   spironolactone  (ALDACTONE ) 25 MG tablet, Take 1 tablet by mouth once daily, Disp: 90 tablet, Rfl: 0  Allergies  Allergen Reactions   Other Hives    Blood Pressure Medicine  This was before 2010; she'll discuss  with [facility].   Codeine Sulfate     REACTION: nausea    Objective:   BP (!) 148/82   Pulse 73   Temp 98.8 F (37.1 C)   Resp 20   Ht 5' 7 (1.702 m)   Wt 195 lb (88.5 kg)   LMP 05/14/2007   SpO2 99%   BMI 30.54 kg/m    Physical Exam Vitals reviewed.  Constitutional:      General: She is not in acute distress.    Appearance: Normal appearance. She is not ill-appearing, toxic-appearing or diaphoretic.  HENT:     Head: Normocephalic and atraumatic.     Right Ear: Tympanic membrane, ear canal and external ear normal. There is no impacted cerumen.     Left Ear: Tympanic membrane, ear canal and external ear normal. There is no impacted cerumen.     Nose: Rhinorrhea present. No congestion. Rhinorrhea is clear.     Right Turbinates: Swollen and pale.     Left Turbinates: Swollen and pale.     Right Sinus: No maxillary sinus tenderness or frontal sinus tenderness.     Left Sinus: No maxillary sinus tenderness or frontal sinus tenderness.     Mouth/Throat:     Mouth: Mucous membranes are moist.     Pharynx: Oropharynx is clear. No oropharyngeal exudate or posterior oropharyngeal erythema.  Eyes:     General: No scleral icterus.       Right eye: No discharge.        Left eye: No  discharge.     Conjunctiva/sclera: Conjunctivae normal.  Cardiovascular:     Rate and Rhythm: Normal rate and regular rhythm.     Heart sounds: Normal heart sounds. No murmur heard.    No friction rub. No gallop.  Pulmonary:     Effort: Pulmonary effort is normal. No respiratory distress.     Breath sounds: Normal breath sounds. No stridor. No wheezing, rhonchi or rales.  Musculoskeletal:        General: Normal range of motion.     Cervical back: Normal range of motion.  Lymphadenopathy:     Cervical: No cervical adenopathy.  Skin:    General: Skin is warm and dry.     Capillary Refill: Capillary refill takes less than 2 seconds.  Neurological:     General: No focal deficit present.     Mental  Status: She is alert and oriented to person, place, and time. Mental status is at baseline.  Psychiatric:        Mood and Affect: Mood normal.        Behavior: Behavior normal.        Thought Content: Thought content normal.        Judgment: Judgment normal.

## 2023-12-10 ENCOUNTER — Ambulatory Visit: Payer: Medicare Other | Admitting: Internal Medicine

## 2023-12-23 ENCOUNTER — Telehealth: Payer: Self-pay

## 2023-12-23 DIAGNOSIS — K5730 Diverticulosis of large intestine without perforation or abscess without bleeding: Secondary | ICD-10-CM | POA: Diagnosis not present

## 2023-12-23 DIAGNOSIS — Z860100 Personal history of colon polyps, unspecified: Secondary | ICD-10-CM | POA: Diagnosis not present

## 2023-12-23 DIAGNOSIS — K5900 Constipation, unspecified: Secondary | ICD-10-CM | POA: Diagnosis not present

## 2023-12-23 DIAGNOSIS — Z800 Family history of malignant neoplasm of digestive organs: Secondary | ICD-10-CM | POA: Diagnosis not present

## 2023-12-23 DIAGNOSIS — Z1211 Encounter for screening for malignant neoplasm of colon: Secondary | ICD-10-CM | POA: Diagnosis not present

## 2023-12-23 NOTE — Telephone Encounter (Signed)
   Name: Kathleen Lucero  DOB: Aug 25, 1955  MRN: [id number]  Primary Cardiologist: None  Chart reviewed as part of pre-operative protocol coverage. Because of Kathleen Lucero past medical history and time since last visit, she will require a follow-up in-office visit in order to better assess preoperative cardiovascular risk.  Pre-op covering staff: - Please schedule appointment and call patient to inform them. If patient already had an upcoming appointment within acceptable timeframe, please add pre-op clearance to the appointment notes so provider is aware. - Please contact requesting surgeon's office via preferred method (i.e, phone, fax) to inform them of need for appointment prior to surgery.   Zanna Hawn D Anis Degidio, NP  12/23/2023, 12:51 PM

## 2023-12-23 NOTE — Telephone Encounter (Signed)
 1st attempt to reach pt regarding surgical clearance and the need for an TELE appointment.  Left pt a detailed message to call back and get that scheduled.

## 2023-12-23 NOTE — Telephone Encounter (Signed)
   Pre-operative Risk Assessment    Patient Name: Kathleen Lucero  DOB: 1955/11/24 MRN: [id number]   Date of last office visit: 03/14/22 POWELL SORROW, MD Date of next office visit: NONE   Request for Surgical Clearance    Procedure:  COLONOSCOPY  Date of Surgery:  Clearance 01/26/24                                Surgeon:  DR KRISTIE Surgeon's Group or Practice Name:  [facility] Phone number:  [phone] Fax number:  [phone]   Type of Clearance Requested:   - Medical    Type of Anesthesia:  PROPOFOL   Additional requests/questions:    Signed, Lucie DELENA Ku   12/23/2023, 12:30 PM

## 2023-12-24 ENCOUNTER — Other Ambulatory Visit: Payer: Self-pay | Admitting: Family Medicine

## 2023-12-24 DIAGNOSIS — U071 COVID-19: Secondary | ICD-10-CM

## 2023-12-24 NOTE — Telephone Encounter (Signed)
 2nd attempt : Called patient regarding TELE appointment. NA, Left message to contact our office.

## 2023-12-25 NOTE — Telephone Encounter (Signed)
 I will send this back to preop APP to confirm if appt is tele or in office ?

## 2023-12-25 NOTE — Telephone Encounter (Signed)
 Patient returned Pre-op call.

## 2023-12-25 NOTE — Telephone Encounter (Signed)
 I left a message for the pt that she is going to need an appt in office for preop clearance. Pt last seen in office 2023.

## 2023-12-29 NOTE — Telephone Encounter (Signed)
 Pt has been scheduled to see Dr. Court 01/07/24 for preop clearance.   I will update all parties involved.

## 2023-12-29 NOTE — Telephone Encounter (Signed)
 LVM asking pt to call our office to schedule OV for preop clearance. Please reach out to preop team if pt returns call. Thank you

## 2024-01-07 ENCOUNTER — Other Ambulatory Visit (HOSPITAL_COMMUNITY): Payer: Self-pay

## 2024-01-07 ENCOUNTER — Encounter: Payer: Self-pay | Admitting: Cardiovascular Disease

## 2024-01-07 ENCOUNTER — Ambulatory Visit: Attending: Cardiovascular Disease | Admitting: Cardiovascular Disease

## 2024-01-07 VITALS — BP 158/80 | HR 72 | Ht 67.0 in | Wt 194.0 lb

## 2024-01-07 DIAGNOSIS — I10 Essential (primary) hypertension: Secondary | ICD-10-CM | POA: Diagnosis not present

## 2024-01-07 DIAGNOSIS — I340 Nonrheumatic mitral (valve) insufficiency: Secondary | ICD-10-CM

## 2024-01-07 DIAGNOSIS — E782 Mixed hyperlipidemia: Secondary | ICD-10-CM

## 2024-01-07 MED ORDER — BLOOD PRESSURE MONITOR AUTOMAT DEVI
1.0000 [IU] | Freq: Once | 0 refills | Status: AC
  Filled 2024-01-07: qty 1, 30d supply, fill #0

## 2024-01-07 NOTE — Progress Notes (Signed)
 01/07/2024 Kathleen Lucero   29-May-1955  [id number]  Primary Physician Geofm Glade PARAS, MD Primary Cardiologist: Dorn PARAS Lesches MD [facility] [place] [place], [place]  HPI:  Kathleen Lucero is a 68 y.o. 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  She was formally a patient of Dr. Lorrane.  I am seeing her for preoperative clearance before a colonoscopy and to assume her care.  Her risk factors include treated hypertension and hyperlipidemia.  There is no family history for heart disease.  She is never had a heart attack or stroke.  She denies chest pain or shortness of breath.  She does walk 20 minutes a day as well [place]  She has CKD 3.    Current Meds  Medication Sig   diltiazem  (CARDIZEM  LA) 240 MG 24 hr tablet TAKE 1 TABLET BY MOUTH EVERY DAY   fluticasone  (FLONASE ) 50 MCG/ACT nasal spray SPRAY 2 SPRAYS INTO EACH NOSTRIL EVERY DAY   levocetirizine (XYZAL ) 5 MG tablet TAKE 1 TABLET BY MOUTH EVERY DAY IN THE EVENING   rosuvastatin  (CRESTOR ) 20 MG tablet TAKE 1 TABLET BY MOUTH EVERY DAY   spironolactone  (ALDACTONE ) 25 MG tablet Take 1 tablet by mouth once daily     Allergies  Allergen Reactions   Other Hives    Blood Pressure Medicine  This was before 2010; she'll discuss with [facility].   Codeine Sulfate     REACTION: nausea    Social History   Socioeconomic History   Marital status: Married    Spouse name: Not on file   Number of children: 3   Years of education: Not on file   Highest education level: Not on file  Occupational History   Occupation: Event organiser: [employer]  Tobacco Use   Smoking status: Never   Smokeless tobacco: Never  Vaping Use   Vaping status: Never Used  Substance and Sexual Activity   Alcohol use: No   Drug use: No   Sexual activity: Yes    Partners: Male     Birth control/protection: Surgical, Post-menopausal    Comment: BTL  Other Topics Concern   Not on file  Social History Narrative   Walks 2X wkly   3 sons, 6 grandchildren   [place] from [place], [place]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   Social Drivers of Corporate investment banker Strain: Low Risk  (11/25/2023)   Overall Financial Resource Strain (CARDIA)    Difficulty of Paying Living Expenses: Not hard at all  Food Insecurity: No Food Insecurity (11/25/2023)   Hunger Vital Sign    Worried About Running Out of Food in the Last Year: Never true    Ran Out of Food in the Last Year: Never true  Transportation Needs: No Transportation Needs (11/25/2023)   PRAPARE - Administrator, Civil Service (Medical): No    Lack of Transportation (Non-Medical): No  Physical Activity: Sufficiently Active (11/25/2023)   Exercise Vital Sign    Days of Exercise per Week: 6 days    Minutes of Exercise per Session: 40 min  Stress: No Stress Concern Present (11/25/2023)   [employer] of Occupational Health - Occupational Stress Questionnaire    Feeling of Stress: Not at all  Social Connections: Socially Integrated (11/25/2023)   Social Connection and Isolation Panel    Frequency  of Communication with Friends and Family: More than three times a week    Frequency of Social Gatherings with Friends and Family: More than three times a week    Attends Religious Services: More than 4 times per year    Active Member of [employer] or Organizations: No    Attends Banker Meetings: 1 to 4 times per year    Marital Status: Married  Catering manager Violence: Not At Risk (11/25/2023)   Humiliation, Afraid, Rape, and Kick questionnaire    Fear of Current or Ex-Partner: No    Emotionally Abused: No    Physically Abused: No    Sexually Abused: No     Review of Systems: General: negative for chills, fever, night sweats or weight changes.  Cardiovascular: negative for chest pain, dyspnea on exertion, edema,  orthopnea, palpitations, paroxysmal nocturnal dyspnea or shortness of breath Dermatological: negative for rash Respiratory: negative for cough or wheezing Urologic: negative for hematuria Abdominal: negative for nausea, vomiting, diarrhea, bright red blood per rectum, melena, or hematemesis Neurologic: negative for visual changes, syncope, or dizziness All other systems reviewed and are otherwise negative except as noted above.    Blood pressure (!) 158/80, pulse 72, height 5' 7 (1.702 m), weight 194 lb (88 kg), last menstrual period 05/14/2007, SpO2 98%.  General appearance: alert and no distress Neck: no adenopathy, no carotid bruit, no JVD, supple, symmetrical, trachea midline, and thyroid  not enlarged, symmetric, no tenderness/mass/nodules Lungs: clear to auscultation bilaterally Heart: regular rate and rhythm, S1, S2 normal, no murmur, click, rub or gallop Extremities: extremities normal, atraumatic, no cyanosis or edema Pulses: 2+ and symmetric Skin: Skin color, texture, turgor normal. No rashes or lesions Neurologic: Grossly normal  EKG EKG Interpretation Date/Time:  Wednesday January 07 2024 10:59:15 EDT Ventricular Rate:  70 PR Interval:  186 QRS Duration:  102 QT Interval:  420 QTC Calculation: 453 R Axis:   -21  Text Interpretation: Normal sinus rhythm Moderate voltage criteria for LVH, may be normal variant ( R in aVL , Cornell product ) When compared with ECG of 14-Oct-2005 07:52, Questionable change in QRS axis Nonspecific T wave abnormality now evident in Inferior leads Confirmed by Court Carrier [phone]) on 01/07/2024 11:05:17 AM    ASSESSMENT AND PLAN:   Hyperlipidemia History of hyperlipidemia on statin therapy with lipid profile performed 05/28/2023 revealing a total cholesterol of 199, LDL 122 and HDL of 63.  I am going to get a coronary calcium  score to her stratify.  Essential hypertension History of essential hypertension with blood pressure measured today  at 158/80.  She is on diltiazem  and and Aldactone .  Mitral regurgitation, severe Her last 2D echo performed 02/14/2022 revealed normal LV systolic function with mild concentric LVH, grade 1 diastolic dysfunction and severe mitral regurgitation.  Her LV size was within normal limits.  She is active and completely asymptomatic.  Will repeat 2D echocardiogram.     Carrier DOROTHA Court MD [facility], [facility] 01/07/2024 11:19 AM

## 2024-01-07 NOTE — Assessment & Plan Note (Signed)
 History of hyperlipidemia on statin therapy with lipid profile performed 05/28/2023 revealing a total cholesterol of 199, LDL 122 and HDL of 63.  I am going to get a coronary calcium  score to her stratify.

## 2024-01-07 NOTE — Assessment & Plan Note (Signed)
 History of essential hypertension with blood pressure measured today at 158/80.  She is on diltiazem  and and Aldactone .

## 2024-01-07 NOTE — Patient Instructions (Signed)
 Medication Instructions:  Your physician recommends that you continue on your current medications as directed. Please refer to the Current Medication list given to you today.  *If you need a refill on your cardiac medications before your next appointment, please call your pharmacy*  Testing/Procedures: Your physician has requested that you have an echocardiogram. Echocardiography is a painless test that uses sound waves to create images of your heart. It provides your doctor with information about the size and shape of your heart and how well your heart's chambers and valves are working. This procedure takes approximately one hour. There are no restrictions for this procedure. Please do NOT wear cologne, perfume, aftershave, or lotions (deodorant is allowed). Please arrive 15 minutes prior to your appointment time.  Please note: We ask at that you not bring children with you during ultrasound (echo/ vascular) testing. Due to room size and safety concerns, children are not allowed in the ultrasound rooms during exams. Our front office staff cannot provide observation of children in our lobby area while testing is being conducted. An adult accompanying a patient to their appointment will only be allowed in the ultrasound room at the discretion of the ultrasound technician under special circumstances. We apologize for any inconvenience.   Dr. Court has ordered a CT coronary calcium  score.   Test locations:  [facility] at [facility] [place]  [facility]  Imaging at [facility]  This is $99 out of pocket.   Coronary CalciumScan A coronary calcium  scan is an imaging test used to look for deposits of calcium  and other fatty materials (plaques) in the inner lining of the blood vessels of the heart (coronary arteries). These deposits of calcium  and plaques can partly clog and narrow the coronary arteries without  producing any symptoms or warning signs. This puts a person at risk for a heart attack. This test can detect these deposits before symptoms develop. Tell a health care provider about: Any allergies you have. All medicines you are taking, including vitamins, herbs, eye drops, creams, and over-the-counter medicines. Any problems you or family members have had with anesthetic medicines. Any blood disorders you have. Any surgeries you have had. Any medical conditions you have. Whether you are pregnant or may be pregnant. What are the risks? Generally, this is a safe procedure. However, problems may occur, including: Harm to a pregnant woman and her unborn baby. This test involves the use of radiation. Radiation exposure can be dangerous to a pregnant woman and her unborn baby. If you are pregnant, you generally should not have this procedure done. Slight increase in the risk of cancer. This is because of the radiation involved in the test. What happens before the procedure? No preparation is needed for this procedure. What happens during the procedure? You will undress and remove any jewelry around your neck or chest. You will put on a hospital gown. Sticky electrodes will be placed on your chest. The electrodes will be connected to an electrocardiogram (ECG) machine to record a tracing of the electrical activity of your heart. A CT scanner will take pictures of your heart. During this time, you will be asked to lie still and hold your breath for 2-3 seconds while a picture of your heart is being taken. The procedure may vary among health care providers and hospitals. What happens after the procedure? You can get dressed. You can return to your normal activities. It is up to you to get  the results of your test. Ask your health care provider, or the department that is doing the test, when your results will be ready. Summary A coronary calcium  scan is an imaging test used to look for deposits of  calcium  and other fatty materials (plaques) in the inner lining of the blood vessels of the heart (coronary arteries). Generally, this is a safe procedure. Tell your health care provider if you are pregnant or may be pregnant. No preparation is needed for this procedure. A CT scanner will take pictures of your heart. You can return to your normal activities after the scan is done. This information is not intended to replace advice given to you by your health care provider. Make sure you discuss any questions you have with your health care provider. Document Released: 10/26/2007 Document Revised: 03/18/2016 Document Reviewed: 03/18/2016 Elsevier Interactive Patient Education  2017 [employer].   Follow-Up: At [facility], you and your health needs are our priority.  As part of our continuing mission to provide you with exceptional heart care, our providers are all part of one team.  This team includes your primary Cardiologist (physician) and Advanced Practice Providers or APPs (Physician Assistants and Nurse Practitioners) who all work together to provide you with the care you need, when you need it.  Your next appointment:   12 month(s)  Provider:   Dorn Lesches, MD    We recommend signing up for the patient portal called MyChart.  Sign up information is provided on this After Visit Summary.  MyChart is used to connect with patients for Virtual Visits (Telemedicine).  Patients are able to view lab/test results, encounter notes, upcoming appointments, etc.  Non-urgent messages can be sent to your provider as well.   To learn more about what you can do with MyChart, go to [email or web address].   Other Instructions Dr. Lesches has requested that you schedule an appointment with one of our clinical pharmacists for a blood pressure check appointment within the next 4-6 weeks.  If you monitor your blood pressure (BP) at home, please bring your BP cuff and your BP readings with  you to this appointment  HOW TO TAKE YOUR BLOOD PRESSURE: Rest 5 minutes before taking your blood pressure. Don't smoke or drink caffeinated beverages for at least 30 minutes before. Take your blood pressure before (not after) you eat. Sit comfortably with your back supported and both feet on the floor (don't cross your legs). Elevate your arm to heart level on a table or a desk. Use the proper sized cuff. It should fit smoothly and snugly around your bare upper arm. There should be enough room to slip a fingertip under the cuff. The bottom edge of the cuff should be 1 inch above the crease of the elbow. Ideally, take 3 measurements at one sitting and record the average.

## 2024-01-07 NOTE — Assessment & Plan Note (Signed)
 Her last 2D echo performed 02/14/2022 revealed normal LV systolic function with mild concentric LVH, grade 1 diastolic dysfunction and severe mitral regurgitation.  Her LV size was within normal limits.  She is active and completely asymptomatic.  Will repeat 2D echocardiogram.

## 2024-01-08 ENCOUNTER — Encounter: Payer: Self-pay | Admitting: Internal Medicine

## 2024-01-08 NOTE — Progress Notes (Unsigned)
      Subjective:    Patient ID: Keymora Grillot, female    DOB: 1955/07/17, 68 y.o.   MRN: [id number]     HPI Shelbie is here for follow up of her chronic medical problems.  ? Taking calcium   Medications and allergies reviewed with patient and updated if appropriate.  Current Outpatient Medications on File Prior to Visit  Medication Sig Dispense Refill   Blood Pressure Monitoring (BLOOD PRESSURE MONITOR AUTOMAT) DEVI Use to measure blood pressure as directed. 1 each 0   diltiazem  (CARDIZEM  LA) 240 MG 24 hr tablet TAKE 1 TABLET BY MOUTH EVERY DAY 90 tablet 2   fluticasone  (FLONASE ) 50 MCG/ACT nasal spray SPRAY 2 SPRAYS INTO EACH NOSTRIL EVERY DAY 48 mL 1   levocetirizine (XYZAL ) 5 MG tablet TAKE 1 TABLET BY MOUTH EVERY DAY IN THE EVENING 90 tablet 1   rosuvastatin  (CRESTOR ) 20 MG tablet TAKE 1 TABLET BY MOUTH EVERY DAY 90 tablet 3   spironolactone  (ALDACTONE ) 25 MG tablet Take 1 tablet by mouth once daily 90 tablet 0   No current facility-administered medications on file prior to visit.     Review of Systems     Objective:  There were no vitals filed for this visit. BP Readings from Last 3 Encounters:  01/07/24 (!) 158/80  12/01/23 (!) 148/82  05/28/23 132/72   Wt Readings from Last 3 Encounters:  01/07/24 194 lb (88 kg)  12/01/23 195 lb (88.5 kg)  11/25/23 198 lb (89.8 kg)   There is no height or weight on file to calculate BMI.    Physical Exam     Lab Results  Component Value Date   WBC 5.0 05/28/2023   HGB 14.8 05/28/2023   HCT 45.8 05/28/2023   PLT 180.0 05/28/2023   GLUCOSE 92 05/28/2023   CHOL 199 05/28/2023   TRIG 65.0 05/28/2023   HDL 63.80 05/28/2023   LDLDIRECT 130.2 11/05/2006   LDLCALC 122 (H) 05/28/2023   ALT 17 05/28/2023   AST 18 05/28/2023   NA 141 05/28/2023   K 4.0 05/28/2023   CL 104 05/28/2023   CREATININE 0.92 05/28/2023   BUN 15 05/28/2023   CO2 25 05/28/2023   TSH 1.66 05/28/2023   HGBA1C 6.2 05/28/2023   MICROALBUR  1.30 02/22/2011     Assessment & Plan:    See Problem List for Assessment and Plan of chronic medical problems.

## 2024-01-08 NOTE — Patient Instructions (Addendum)
      Blood work was ordered.       Medications changes include :   None    An ultrasound of your thyroid  was ordered and someone will call you to schedule an appointment.     Return in about 6 months (around 07/10/2024) for Physical Exam.

## 2024-01-09 ENCOUNTER — Ambulatory Visit: Admitting: Internal Medicine

## 2024-01-09 VITALS — BP 136/70 | HR 72 | Temp 98.6°F | Ht 67.0 in | Wt 192.0 lb

## 2024-01-09 DIAGNOSIS — E559 Vitamin D deficiency, unspecified: Secondary | ICD-10-CM

## 2024-01-09 DIAGNOSIS — N1831 Chronic kidney disease, stage 3a: Secondary | ICD-10-CM | POA: Diagnosis not present

## 2024-01-09 DIAGNOSIS — I10 Essential (primary) hypertension: Secondary | ICD-10-CM

## 2024-01-09 DIAGNOSIS — E042 Nontoxic multinodular goiter: Secondary | ICD-10-CM | POA: Diagnosis not present

## 2024-01-09 DIAGNOSIS — E782 Mixed hyperlipidemia: Secondary | ICD-10-CM

## 2024-01-09 DIAGNOSIS — E669 Obesity, unspecified: Secondary | ICD-10-CM

## 2024-01-09 DIAGNOSIS — E119 Type 2 diabetes mellitus without complications: Secondary | ICD-10-CM

## 2024-01-09 LAB — LIPID PANEL
Cholesterol: 187 mg/dL (ref 0–200)
HDL: 63.5 mg/dL (ref >39.00)
LDL Cholesterol: 109 mg/dL — ABNORMAL HIGH (ref 0–99)
NonHDL: 123.55
Total CHOL/HDL Ratio: 3
Triglycerides: 72 mg/dL (ref 0.0–149.0)
VLDL: 14.4 mg/dL (ref 0.0–40.0)

## 2024-01-09 LAB — COMPREHENSIVE METABOLIC PANEL WITH GFR
ALT: 21 U/L (ref 0–35)
AST: 20 U/L (ref 0–37)
Albumin: 4.9 g/dL (ref 3.5–5.2)
Alkaline Phosphatase: 112 U/L (ref 39–117)
BUN: 18 mg/dL (ref 6–23)
CO2: 26 meq/L (ref 19–32)
Calcium: 10.6 mg/dL — ABNORMAL HIGH (ref 8.4–10.5)
Chloride: 103 meq/L (ref 96–112)
Creatinine, Ser: 1.04 mg/dL (ref 0.40–1.20)
GFR: 55.81 mL/min — ABNORMAL LOW (ref >60.00)
Glucose, Bld: 111 mg/dL — ABNORMAL HIGH (ref 70–99)
Potassium: 4.1 meq/L (ref 3.5–5.1)
Sodium: 141 meq/L (ref 135–145)
Total Bilirubin: 0.7 mg/dL (ref 0.2–1.2)
Total Protein: 8.4 g/dL — ABNORMAL HIGH (ref 6.0–8.3)

## 2024-01-09 LAB — MICROALBUMIN / CREATININE URINE RATIO
Creatinine,U: 54.6 mg/dL
Microalb Creat Ratio: 33 mg/g — ABNORMAL HIGH (ref 0.0–30.0)
Microalb, Ur: 1.8 mg/dL (ref 0.0–1.9)

## 2024-01-09 LAB — HEMOGLOBIN A1C: Hgb A1c MFr Bld: 6.6 % — ABNORMAL HIGH (ref 4.6–6.5)

## 2024-01-09 LAB — VITAMIN D 25 HYDROXY (VIT D DEFICIENCY, FRACTURES): VITD: 35.47 ng/mL (ref 30.00–100.00)

## 2024-01-09 LAB — MAGNESIUM: Magnesium: 2.1 mg/dL (ref 1.5–2.5)

## 2024-01-09 LAB — TSH: TSH: 1.95 u[IU]/mL (ref 0.35–5.50)

## 2024-01-09 NOTE — Assessment & Plan Note (Signed)
 Chronic Regular exercise and healthy diet encouraged Check lipid panel, CMP Continue Crestor 20 mg daily

## 2024-01-09 NOTE — Assessment & Plan Note (Signed)
Chronic She is working on weight loss Continue regular exercise Stressed healthy diet-small portions, high in protein, vegetables, fiber

## 2024-01-09 NOTE — Assessment & Plan Note (Addendum)
 Chronic Mild, stable CMP No longer taking Jardiance  10 mg daily - caused nausea

## 2024-01-09 NOTE — Assessment & Plan Note (Signed)
 Chronic  Lab Results  Component Value Date   HGBA1C 6.2 05/28/2023   Sugars controlled Check A1c, urine microalbumin Currently controlled with lifestyle No longer taking jardiance  10 mg daily Continue regular exercise, diabetic diet Continue weight loss efforts

## 2024-01-09 NOTE — Assessment & Plan Note (Signed)
 Chronic Taking vitamin D daily Check vitamin D level

## 2024-01-09 NOTE — Assessment & Plan Note (Signed)
 Chronic Denies any changes in thryoid Last US  was a while ago - nodules had shrunk at that time US  ordered last year but never got done --- will reorder US  today -- if stable does not need further follow up tsh

## 2024-01-09 NOTE — Assessment & Plan Note (Signed)
 Chronic Blood pressure controlled CMP Continue diltiazem  240 mg daily, spironolactone  25 mg daily Stress healthy diet and regular exercise

## 2024-01-09 NOTE — Assessment & Plan Note (Addendum)
 Has had elevated calcium  levels Not taking any calcium  supplements cmp

## 2024-01-12 ENCOUNTER — Ambulatory Visit: Payer: Self-pay | Admitting: Internal Medicine

## 2024-01-22 DIAGNOSIS — E119 Type 2 diabetes mellitus without complications: Secondary | ICD-10-CM | POA: Diagnosis not present

## 2024-02-12 ENCOUNTER — Other Ambulatory Visit: Payer: Self-pay | Admitting: Internal Medicine

## 2024-02-18 ENCOUNTER — Ambulatory Visit: Attending: Cardiology | Admitting: Pharmacist

## 2024-02-18 ENCOUNTER — Other Ambulatory Visit (HOSPITAL_COMMUNITY): Payer: Self-pay

## 2024-02-18 ENCOUNTER — Other Ambulatory Visit: Payer: Self-pay

## 2024-02-18 VITALS — BP 166/88 | HR 73

## 2024-02-18 DIAGNOSIS — I10 Essential (primary) hypertension: Secondary | ICD-10-CM

## 2024-02-18 DIAGNOSIS — E782 Mixed hyperlipidemia: Secondary | ICD-10-CM | POA: Diagnosis not present

## 2024-02-18 DIAGNOSIS — E119 Type 2 diabetes mellitus without complications: Secondary | ICD-10-CM

## 2024-02-18 MED ORDER — DILTIAZEM HCL ER 180 MG PO TB24
180.0000 mg | ORAL_TABLET | Freq: Every day | ORAL | 3 refills | Status: AC
  Filled 2024-02-18: qty 90, 90d supply, fill #0

## 2024-02-18 NOTE — Assessment & Plan Note (Signed)
 Assessment: Blood pressure elevated in clinic- most likely white coat HTN Home BP well controlled- has OMRON upper arm cuff bought at our pharmacy and her granddaughter who is an Charity fundraiser has verified its accuracy Sometimes she is cutting diltiazem  in 1/2- advised it cannot be cut due to being controlled release Reports HR in 40-50's at home Exercises for per week Home BP average 121/65 She was on valsartan /hydrochlorothiazide  in the past. Feels like it was the cause of all her kidney issues. We discussed how she was on the combination of valsartan  and hydrochlorothiazide  and it was more than likely the hydrochlorothiazide  or the combo that caused the bump in scr than the valsartan  alone. We talked about how ARBs are kidney protective and can slow the progression of CKD, especially when microalbinuria is present.   We dicussed using another ARB such as irbesartan or olmesartan  Plan:  Decrease diltiazem  to 180mg  due to low HR Continue to monitor BP at home. Pt to call me if increases above goal She will think about swapping diltiazem  for an ARB Follow up in 6 -7weeks

## 2024-02-18 NOTE — Patient Instructions (Addendum)
 I would recommend swapping out diltiazem  for either valsartan , irbesartan or olmesartan  I have placed a referral for a nutritionist  Continue diltiazem  180mg  daily and spironolactone  25mg  daily  Continue to check blood pressure and heart rate at home  Please call at [phone] with any questions or MyChart  Your blood pressure goal is < 130/46mmHg   Important lifestyle changes to control high blood pressure  Intervention  Effect on the BP   Weight loss Weight loss is one of the most effective lifestyle changes for controlling blood pressure. If you're overweight or obese, losing even a small amount of weight can help reduce blood pressure.    Blood pressure can decrease by 1 millimeter of mercury (mmHg) with each kilogram (about 2.2 pounds) of weight lost.   Exercise regularly As a general goal, aim for 30 minutes of moderate physical activity every day.    Regular physical activity can lower blood pressure by 5 - 8 mmHg.   Eat a healthy diet Eat a diet rich in whole grains, fruits, vegetables, lean meat, and low-fat dairy products. Limit processed foods, saturated fat, and sweets.    A heart-healthy diet can lower high blood pressure by 10 mmHg.   Reduce salt (sodium) in your diet Aim for 000mg  of sodium each day. Avoid deli meats, canned food, and frozen microwave meals which are high in sodium.     Limiting sodium can reduce blood pressure by 5 mmHg.   Limit alcohol One drink equals 12 ounces of beer, 5 ounces of wine, or 1.5 ounces of 80-proof liquor.    Limiting alcohol to < 1 drink a day for women or < 2 drinks a day for men can help lower blood pressure by about 4 mmHg.   To check your pressure at home you will need to:   Sit up in a chair, with feet flat on the floor and back supported. Do not cross your ankles or legs. Rest your left arm so that the cuff is about heart level. If the cuff goes on your upper arm, then just relax your arm on the table, arm  of the chair, or your lap. If you have a wrist cuff, hold your wrist against your chest at heart level. Place the cuff snugly around your arm, about 1 inch above the crease of your elbow. The cords should be inside the groove of your elbow.  Sit quietly, with the cuff in place, for about 5 minutes. Then press the power button to start a reading. Do not talk or move while the reading is taking place.  Record your readings on a sheet of paper. Although most cuffs have a memory, it is often easier to see a pattern developing when the numbers are all in front of you.  You can repeat the reading after 1-3 minutes if it is recommended.   Make sure your bladder is empty and you have not had caffeine or tobacco within the last 30 minutes   Always bring your blood pressure log with you to your appointments. If you have not brought your monitor in to be double checked for accuracy, please bring it to your next appointment.   You can find a list of validated (accurate) blood pressure cuffs at: validatebp.org

## 2024-02-18 NOTE — Assessment & Plan Note (Signed)
 Assessment: A1C has increased from 6.2 to 6.6 Patient admits that she resumed sugary drinks and poor eating habits She does not want to take more medications Motivated to improve her diet   Plan: Cut out sugary drinks Increase water intake Decrease fast food/ fried foods

## 2024-02-18 NOTE — Progress Notes (Signed)
 Patient ID: Kathleen Lucero                 DOB: 1955-05-30                      MRN: [id number]      HPI: [name] is a 68 y.o. female referred by Dr. Court to HTN clinic. PMH is significant for HTN, HLD, goiter, CKD and diet controlled DM.   Labs at PCP in August showed small increase in A1C, microalbunuria, and LDL-C 109. BP was elevated at office with PCP and Dr. Court.  Patient presents today to clinic. She brings in home blood pressure readings that have averaged 121/65 over the last week or so. She admits that sometimes she cuts her diltiazem  in half because she feels like she doesn't need that much medication. She was educated that diltiazem  cannot be cut due to being controlled released. She reports sometimes her HR at home are 40-50s.  She was on valsartan /hydrochlorothiazide  in the past. Feels like it was the cause of all her kidney issues. We discussed how she was on the combination of valsartan  and hydrochlorothiazide  and it was more than likely the hydrochlorothiazide  or the combo that caused the bump in scr than the valsartan  alone. We talked about how ARBs are kidney protective and can slow the progression of CKD, especially when microalbinuria is present.    She expresses that she really doesn't want to be on more medications. She previously improved her diet and lost weight. Had stopped drinking soda, lemonade and sweet tea. She unfortunately got out of her good habits which has led to an increase in her A1C and worsening of kidney function. She is motivated to get back on track.   We did discuss her lipid panel as well. LDL-C goal <70 due to DM. Currently 109. Does not want any more medications. Explained that depending on her CAC, we may need to be more aggressive.  Current HTN meds: diltiazem  240mg  daily, spironolactone  25mg  daily  Previously tried: valsartan /hydrochlorothiazide  (decrease in GFR) BP goal: <130/80  Family History:  Family History  Problem  Relation Age of Onset   Hypertension Mother    Stroke Mother 39       5 days post partum   Alzheimer's disease Mother    Hypertension Brother         2 brothers   Cancer Brother 70       colorectal   Hypertension Brother    Hypertension Maternal Grandmother    Thyroid  disease Maternal Grandmother        hypothyroidism   Diabetes Maternal Grandmother        diet controlled   Dementia Maternal Grandmother 58   Alzheimer's disease Maternal Grandmother    Dementia Paternal Aunt    Heart attack Neg Hx     Social History: no ETOH, no tobacco  Diet: sweet tea, lemonaid  Doesn't cook with salt Eats out often [employer] (chick fil A sandwich w/ fries, steakhouse, [employer] (2 hot dogs)  Exercise: tries to walk daily (150 min daily)   Home BP readings:  125 69  122 70  123 69  114 65  124 68  125 64  130 68  122 64  122 66  102 57  120 65  126 65  117  63  124 66  121.1429 65.64286    Wt Readings from Last 3 Encounters:  01/09/24 192 lb (87.1 kg)  01/07/24  194 lb (88 kg)  12/01/23 195 lb (88.5 kg)   BP Readings from Last 3 Encounters:  02/18/24 (!) 166/88  01/09/24 136/70  01/07/24 (!) 158/80   Pulse Readings from Last 3 Encounters:  02/18/24 73  01/09/24 72  01/07/24 72    Renal function: CrCl cannot be calculated (Patient's most recent lab result is older than the maximum 21 days allowed.).  Past Medical History:  Diagnosis Date   Diabetes mellitus (HCC)    A1c 6.7 in 3/18   GERD (gastroesophageal reflux disease) 2007   Goiter    Heart murmur    my whole life   Hyperlipidemia    Hypertension    Lumbar degenerative disc disease    S/P ruptured disc, Dr. Renay Heading   T3 thyrotoxicosis 2011   S/P RAI   Uterine fibroid    Dr Cleotilde    Current Outpatient Medications on File Prior to Visit  Medication Sig Dispense Refill   rosuvastatin  (CRESTOR ) 20 MG tablet TAKE 1 TABLET BY MOUTH EVERY DAY 90 tablet 3   spironolactone  (ALDACTONE ) 25 MG  tablet Take 1 tablet by mouth once daily 90 tablet 0   No current facility-administered medications on file prior to visit.    Allergies  Allergen Reactions   Jardiance  [Empagliflozin ] Nausea Only   Other Hives    Blood Pressure Medicine  This was before 2010; she'll discuss with [facility].   Codeine Sulfate     REACTION: nausea    Blood pressure (!) 166/88, pulse 73, last menstrual period 05/14/2007.   Assessment/Plan: HYPERTENSION CONTROL Vitals:   02/18/24 0904 02/18/24 0905  BP: (!) 180/92 (!) 166/88    The patient's blood pressure is elevated above target today.  In order to address the patient's elevated BP: The blood pressure is usually elevated in clinic.  Blood pressures monitored at home have been optimal.      1. Hypertension -  Essential hypertension Assessment: Blood pressure elevated in clinic- most likely white coat HTN Home BP well controlled- has OMRON upper arm cuff bought at our pharmacy and her granddaughter who is an Charity fundraiser has verified its accuracy Sometimes she is cutting diltiazem  in 1/2- advised it cannot be cut due to being controlled release Reports HR in 40-50's at home Exercises for per week Home BP average 121/65 She was on valsartan /hydrochlorothiazide  in the past. Feels like it was the cause of all her kidney issues. We discussed how she was on the combination of valsartan  and hydrochlorothiazide  and it was more than likely the hydrochlorothiazide  or the combo that caused the bump in scr than the valsartan  alone. We talked about how ARBs are kidney protective and can slow the progression of CKD, especially when microalbinuria is present.   We dicussed using another ARB such as irbesartan or olmesartan  Plan:  Decrease diltiazem  to 180mg  due to low HR Continue to monitor BP at home. Pt to call me if increases above goal She will think about swapping diltiazem  for an ARB Follow up in 6 -7weeks  Type 2 diabetes mellitus without complication,  without long-term current use of insulin (HCC) Assessment: A1C has increased from 6.2 to 6.6 Patient admits that she resumed sugary drinks and poor eating habits She does not want to take more medications Motivated to improve her diet   Plan: Cut out sugary drinks Increase water intake Decrease fast food/ fried foods  Hyperlipidemia Assessment: LDL-C is 109 on rosuvastatin  20mg  daily Goal due to DM is <70  Does not want any more medications Planning for CAC score. We discussed what this does and does not tell us  Depending on results may need to be more aggressive in cholesterol treatment  Plan: Follow up CAC score and repeat lipid panel after diet changes      Thank you  Needham Biggins D Dianna Deshler, Pharm.JONETTA SARAN, CPP [facility] A [facility] of [facility] [address], [place], [place] [address]  Phone: [phone]; Fax: [phone]

## 2024-02-18 NOTE — Assessment & Plan Note (Signed)
 Assessment: LDL-C is 109 on rosuvastatin  20mg  daily Goal due to DM is <70 Does not want any more medications Planning for CAC score. We discussed what this does and does not tell us  Depending on results may need to be more aggressive in cholesterol treatment  Plan: Follow up CAC score and repeat lipid panel after diet changes

## 2024-02-20 ENCOUNTER — Ambulatory Visit (HOSPITAL_BASED_OUTPATIENT_CLINIC_OR_DEPARTMENT_OTHER): Payer: Medicare Other | Admitting: Obstetrics & Gynecology

## 2024-02-23 ENCOUNTER — Other Ambulatory Visit (HOSPITAL_COMMUNITY)
Admission: RE | Admit: 2024-02-23 | Discharge: 2024-02-23 | Disposition: A | Discharge status: Home / Self Care | Source: Ambulatory Visit | Attending: Obstetrics & Gynecology | Admitting: Obstetrics & Gynecology

## 2024-02-23 ENCOUNTER — Ambulatory Visit (HOSPITAL_BASED_OUTPATIENT_CLINIC_OR_DEPARTMENT_OTHER): Admitting: Obstetrics & Gynecology

## 2024-02-23 ENCOUNTER — Encounter (HOSPITAL_BASED_OUTPATIENT_CLINIC_OR_DEPARTMENT_OTHER): Payer: Self-pay | Admitting: Obstetrics & Gynecology

## 2024-02-23 VITALS — BP 137/76 | HR 65 | Wt 195.0 lb

## 2024-02-23 DIAGNOSIS — I10 Essential (primary) hypertension: Secondary | ICD-10-CM | POA: Diagnosis not present

## 2024-02-23 DIAGNOSIS — Z124 Encounter for screening for malignant neoplasm of cervix: Secondary | ICD-10-CM | POA: Insufficient documentation

## 2024-02-23 DIAGNOSIS — E119 Type 2 diabetes mellitus without complications: Secondary | ICD-10-CM | POA: Diagnosis not present

## 2024-02-23 DIAGNOSIS — Z01419 Encounter for gynecological examination (general) (routine) without abnormal findings: Secondary | ICD-10-CM | POA: Diagnosis not present

## 2024-02-23 DIAGNOSIS — D251 Intramural leiomyoma of uterus: Secondary | ICD-10-CM | POA: Diagnosis not present

## 2024-02-23 NOTE — Progress Notes (Signed)
 ANNUAL EXAM Patient name: Kathleen Lucero MRN [id number]  Date of birth: 1955-06-08 Chief Complaint:   Gynecologic Exam (Patient reports no issues or concerns today. )  History of Present Illness:   Kathleen Lucero is a 68 y.o. [phone] African-American female being seen today for breast and pelvic exam.  Denies vaginal bleeding.  Doing well.  Does have colonoscopy scheduled for November with Dr. Kristie.  Had to see cardiology first and that is scheduled with Dr. Court.    She loves Nidia and they went for their anniversary.  This was their 42nd.    Retired 2 years but wants to go back to work, at least part time.    Patient's last menstrual period was 05/14/2007.  Last pap 03/07/2022. Results were: NILM w/ HRHPV not done. H/O abnormal pap: no Last mammogram: 06/13/2023. Results were: normal. Family h/o breast cancer: no Last colonoscopy: 10/17/2016. Results were: abnormal four polyps. Patient states that she has next one scheduled in November. Family h/o colorectal cancer: yes brother and maternal uncle and aunt. DEXA:  03/2023.  Normal.  Discussed today.     02/23/2024    8:51 AM 01/09/2024    8:09 AM 12/01/2023   10:20 AM 11/25/2023    8:17 AM 05/28/2023    8:35 AM  Depression screen PHQ 2/9  Decreased Interest 0 0 0 0 0  Down, Depressed, Hopeless 0 0 0 0 0  PHQ - 2 Score 0 0 0 0 0  Altered sleeping  0  0   Tired, decreased energy  0  0   Change in appetite  0  0   Feeling bad or failure about yourself   0  0   Trouble concentrating  0  0   Moving slowly or fidgety/restless  0  0   Suicidal thoughts  0  0   PHQ-9 Score  0  0   Difficult doing work/chores  Not difficult at all  Not difficult at all         12/22/2020    1:58 PM 07/21/2019    4:47 PM  GAD 7 : Generalized Anxiety Score  Nervous, Anxious, on Edge 0 1  Control/stop worrying 0 1  Worry too much - different things 0 0  Trouble relaxing 0 0  Restless 0 0  Easily annoyed or irritable 0 0  Afraid -  awful might happen 0 1  Total GAD 7 Score 0 3  Anxiety Difficulty Not difficult at all      Review of Systems:   Pertinent items are noted in HPI Denies any bowel or urinary changes.  H/o chronic constipation that is stable.  Denies pelvic pain.   Pertinent History Reviewed:  Reviewed past medical,surgical, social and family history.  Reviewed problem list, medications and allergies. Physical Assessment:   Vitals:   02/23/24 0846  BP: 137/76  Pulse: 65  SpO2: 100%  Weight: 195 lb (88.5 kg)  Body mass index is 30.54 kg/m.        Physical Examination:   General appearance - well appearing, and in no distress  Mental status - alert, oriented to person, place, and time  Psych:  She has a normal mood and affect  Skin - warm and dry, normal color, no suspicious lesions noted  Chest - effort normal, all lung fields clear to auscultation bilaterally  Heart - normal rate and regular rhythm  Neck:  midline trachea, no thyromegaly or nodules  Breasts - breasts appear normal, no  suspicious masses, no skin or nipple changes or  axillary nodes  Abdomen - soft, nontender, nondistended, no masses or organomegaly  Pelvic - VULVA: normal appearing vulva with no masses, tenderness or lesions   VAGINA: normal appearing vagina with normal color and discharge, no lesions   CERVIX: normal appearing cervix without discharge or lesions, no CMT  Thin prep pap is updated today.  HR HPV not obtained today.  UTERUS: uterus is about 8 - 10 weeks, globular, nontender   ADNEXA: No adnexal masses or tenderness noted.  Rectal - normal rectal, good sphincter tone, no masses felt.   Extremities:  No swelling or varicosities noted  Chaperone present for exam  No results found for this or any previous visit (from the past 24 hours).  Assessment & Plan:  1. Encntr for gyn exam (general) (routine) w/o abn findings (Primary) - Pap smear updated today - Mammogram 05/2023 - Colonoscopy scheduled 03/2024 - Bone  mineral density 03/2023 - lab work done with PCP, Dr. Geofm - vaccines reviewed/updated  2. Fibroids, intramural - stable exam  3. Type 2 diabetes mellitus without complication, without long-term current use of insulin (HCC) - hba1C 6.6    4. Cervical cancer screening - Cytology - PAP( [place]) - PR OBTAINING SCREEN PAP SMEAR  5. Essential hypertension - on spironolactone , diliatiazem   Orders Placed This Encounter  Procedures   PR OBTAINING SCREEN PAP SMEAR    Meds: No orders of the defined types were placed in this encounter.   Follow-up: Return in about 1 year (around 02/22/2025).  Ronal GORMAN Pinal, MD 02/23/2024 9:21 AM

## 2024-02-24 ENCOUNTER — Other Ambulatory Visit: Payer: Self-pay | Admitting: Internal Medicine

## 2024-02-25 LAB — CYTOLOGY - PAP: Diagnosis: NEGATIVE

## 2024-02-26 ENCOUNTER — Ambulatory Visit (HOSPITAL_BASED_OUTPATIENT_CLINIC_OR_DEPARTMENT_OTHER): Payer: Self-pay | Admitting: Obstetrics & Gynecology

## 2024-03-10 NOTE — Telephone Encounter (Signed)
 Received another PreOp clearance with an updated date for colonoscopy - 03/22/2024

## 2024-03-10 NOTE — Telephone Encounter (Signed)
   Name: Kathleen Lucero  DOB: 26-Jul-1955  MRN: [id number]  Primary Cardiologist: None   Preoperative team, please contact this patient and set up a phone call appointment for further preoperative risk assessment. Please obtain consent and complete medication review. Thank you for your help. No note since 01/07/2024. Did not clear back then.   I confirm that guidance regarding antiplatelet and oral anticoagulation therapy has been completed and, if necessary, noted below.  I also confirmed the patient resides in the state of [place] . As per [facility] Medical Board telemedicine laws, the patient must reside in the state in which the provider is licensed.   Lamarr Satterfield, NP 03/10/2024, 1:44 PM [facility]

## 2024-03-10 NOTE — Telephone Encounter (Signed)
 1st attempt to reach pt regarding surgical clearance and the need for an TELE appointment.  Left pt a detailed message to call back and get that scheduled.

## 2024-03-11 NOTE — Telephone Encounter (Signed)
 2ND Attempt to reach the pt to schedule a tele preop appt. New procedure date is 03/22/24.

## 2024-03-12 ENCOUNTER — Ambulatory Visit (HOSPITAL_COMMUNITY)
Admission: RE | Admit: 2024-03-12 | Discharge: 2024-03-12 | Disposition: A | Discharge status: Home / Self Care | Source: Ambulatory Visit | Attending: Cardiovascular Disease | Admitting: Cardiovascular Disease

## 2024-03-12 DIAGNOSIS — I340 Nonrheumatic mitral (valve) insufficiency: Secondary | ICD-10-CM | POA: Diagnosis present

## 2024-03-12 DIAGNOSIS — E782 Mixed hyperlipidemia: Secondary | ICD-10-CM | POA: Diagnosis present

## 2024-03-12 DIAGNOSIS — I10 Essential (primary) hypertension: Secondary | ICD-10-CM | POA: Diagnosis present

## 2024-03-12 NOTE — Telephone Encounter (Signed)
 Pt called to FU please advise [phone] correct number for pt

## 2024-03-12 NOTE — Telephone Encounter (Signed)
 3rd attempt to reach pt regarding surgical clearance and the need for an TELE appointment. LVMFCB, will update surgeon's office and remove from preop pool.

## 2024-03-12 NOTE — Telephone Encounter (Signed)
 Pt called back and gave correct # to call her. I called and left message to call back Monday to schedule tele preop appt.

## 2024-03-13 ENCOUNTER — Ambulatory Visit: Payer: Self-pay | Admitting: Cardiovascular Disease

## 2024-03-13 DIAGNOSIS — E782 Mixed hyperlipidemia: Secondary | ICD-10-CM

## 2024-03-15 NOTE — Telephone Encounter (Signed)
 2nd attempt, as well as second time around trying to reach the pt to schedule tele preop appt.

## 2024-03-16 NOTE — Telephone Encounter (Signed)
 3rd attempt. Our office is calling the number the pt request for us  to call and we are leaving vm to call back.    I will update the requesting office again that we still have not been able to reach the pt.   I will remove from the preop call back pool until the pt calls back.

## 2024-03-23 LAB — ECHOCARDIOGRAM COMPLETE
Area-P 1/2: 2.39 cm2
MV M vel: 6.46 m/s
MV Peak grad: 166.9 mmHg
Radius: 1.1 cm
S' Lateral: 3.4 cm

## 2024-03-30 ENCOUNTER — Encounter: Payer: Self-pay | Admitting: Dietician

## 2024-03-30 ENCOUNTER — Encounter: Attending: Internal Medicine | Admitting: Dietician

## 2024-03-30 VITALS — Wt 195.0 lb

## 2024-03-30 DIAGNOSIS — E119 Type 2 diabetes mellitus without complications: Secondary | ICD-10-CM | POA: Insufficient documentation

## 2024-03-30 NOTE — Progress Notes (Signed)
 Diabetes Self-Management Education  Visit Type: First/Initial  Appt. Start Time: 0805 Appt. End Time: 0905  03/30/2024  Kathleen Lucero, identified by name and date of birth, is a 68 y.o. female with a diagnosis of Diabetes: Type 2.   ASSESSMENT  History includes: reviewed; type 2 diabetes, GERD, HLD, HTN, thyroid  disease, kidney disease Labs noted: 01/09/24: gfr 55.81, A1C 6.6% Medications include: reviewed Supplements: none reported  Pt states she wants to figure out what she should be eating for diabetes, cholesterol, and kidney disease.   Pt reports she checks her blood pressure every morning.   Pt states she and her husband typically walk the neighborhood and [place], but during the winter they do not do as much walking.   Pt reports she tries to eat between 10:30am and 7pm.   Last menstrual period 05/14/2007. There is no height or weight on file to calculate BMI.   Diabetes Self-Management Education - 03/30/24 0801       Visit Information   Visit Type First/Initial      Initial Visit   Diabetes Type Type 2    Date Diagnosed unsure    Are you currently following a meal plan? No      Health Coping   How would you rate your overall health? Good      Psychosocial Assessment   Patient Belief/Attitude about Diabetes Motivated to manage diabetes    What is the hardest part about your diabetes right now, causing you the most concern, or is the most worrisome to you about your diabetes?   Making healty food and beverage choices    Self-care barriers None    Self-management support Doctor's office    Other persons present Patient    Patient Concerns Nutrition/Meal planning    Special Needs None    Preferred Learning Style No preference indicated    Learning Readiness Ready    How often do you need to have someone help you when you read instructions, pamphlets, or other written materials from your doctor or pharmacy? 1 - Never    What is the last grade  level you completed in school? some college      Pre-Education Assessment   Patient understands the diabetes disease and treatment process. Needs Instruction    Patient understands incorporating nutritional management into lifestyle. Needs Instruction    Patient undertands incorporating physical activity into lifestyle. Needs Instruction    Patient understands using medications safely. Needs Instruction    Patient understands monitoring blood glucose, interpreting and using results Needs Instruction    Patient understands prevention, detection, and treatment of acute complications. Needs Instruction    Patient understands prevention, detection, and treatment of chronic complications. Needs Instruction    Patient understands how to develop strategies to address psychosocial issues. Needs Instruction    Patient understands how to develop strategies to promote health/change behavior. Needs Instruction      Complications   Last HgB A1C per patient/outside source 6.6 %    How often do you check your blood sugar? 0 times/day (not testing)    Have you had a dilated eye exam in the past 12 months? Yes    Have you had a dental exam in the past 12 months? Yes    Are you checking your feet? Yes    How many days per week are you checking your feet? 7      Dietary Intake   Breakfast 10:30am: oatmeal with butter and coffee mate OR  cheerios and milk OR cornflakes and sugar    Snack (morning) honey bun    Lunch 1pm: chickfila sandwich and fries OR burger and fries    Snack (afternoon) none    Dinner 7pm: chicken, brussels, potatoes OR hamburger    Snack (evening) none    Beverage(s) 64 oz water, 20 oz sweet tea, lemonade from [employer] a few times a week      Activity / Exercise   Activity / Exercise Type Light (walking / raking leaves)    How many days per week do you exercise? 2    How many minutes per day do you exercise? 30    Total minutes per week of exercise 60      Patient Education    Previous Diabetes Education No    Disease Pathophysiology Explored patient's options for treatment of their diabetes;Factors that contribute to the development of diabetes;Definition of diabetes, type 1 and 2, and the diagnosis of diabetes    Healthy Eating Role of diet in the treatment of diabetes and the relationship between the three main macronutrients and blood glucose level;Plate Method;Meal timing in regards to the patients' current diabetes medication.;Information on hints to eating out and maintain blood glucose control.;Meal options for control of blood glucose level and chronic complications.    Being Active Role of exercise on diabetes management, blood pressure control and cardiac health.;Helped patient identify appropriate exercises in relation to his/her diabetes, diabetes complications and other health issue.    Monitoring Purpose and frequency of SMBG.    Chronic complications Relationship between chronic complications and blood glucose control;Identified and discussed with patient  current chronic complications    Diabetes Stress and Support Identified and addressed patients feelings and concerns about diabetes;Worked with patient to identify barriers to care and solutions;Role of stress on diabetes    Lifestyle and Health Coping Lifestyle issues that need to be addressed for better diabetes care      Individualized Goals (developed by patient)   Nutrition General guidelines for healthy choices and portions discussed    Physical Activity Exercise 3-5 times per week;30 minutes per day    Medications take my medication as prescribed    Monitoring  Test my blood glucose as discussed    Problem Solving Eating Pattern    Reducing Risk examine blood glucose patterns;do foot checks daily;treat hypoglycemia with 15 grams of carbs if blood glucose less than 70mg /dL    Health Coping Ask for help with psychological, social, or emotional issues      Post-Education Assessment   Patient  understands the diabetes disease and treatment process. Comprehends key points    Patient understands incorporating nutritional management into lifestyle. Comprehends key points    Patient undertands incorporating physical activity into lifestyle. Comprehends key points    Patient understands using medications safely. Comphrehends key points    Patient understands monitoring blood glucose, interpreting and using results Comprehends key points    Patient understands prevention, detection, and treatment of acute complications. Comprehends key points    Patient understands prevention, detection, and treatment of chronic complications. Comprehends key points    Patient understands how to develop strategies to address psychosocial issues. Comprehends key points    Patient understands how to develop strategies to promote health/change behavior. Comprehends key points      Outcomes   Expected Outcomes Demonstrated interest in learning. Expect positive outcomes    Future DMSE 2 months    Program Status Not Completed  Individualized Plan for Diabetes Self-Management Training:   Learning Objective:  Patient will have a greater understanding of diabetes self-management. Patient education plan is to attend individual and/or group sessions per assessed needs and concerns.   Plan:   Patient Instructions  Goals Established by Patient:   Goal 1: limit sweet drinks (lemonade and sweet tea) to the weekend.   Other tips:   Try [employer] dressing.   Add protein to breakfast.  Toss salads well to incorporate dressing and use less.   Get indoor exercise (dancing, youtube chair workout).   At meals, aim to include 1/2 plate non-starchy vegetables, 1/4 plate protein, and 1/4 plate complex carbs.   Expected Outcomes:  Demonstrated interest in learning. Expect positive outcomes  Education material provided: My Plate and Snack sheet  If problems or questions, patient to contact team  via:  Phone  Future DSME appointment: 2 months

## 2024-03-30 NOTE — Patient Instructions (Addendum)
 Goals Established by Patient:   Goal 1: limit sweet drinks (lemonade and sweet tea) to the weekend.   Other tips:   Try [employer] dressing.   Add protein to breakfast.  Toss salads well to incorporate dressing and use less.   Get indoor exercise (dancing, youtube chair workout).   At meals, aim to include 1/2 plate non-starchy vegetables, 1/4 plate protein, and 1/4 plate complex carbs.

## 2024-04-05 NOTE — Progress Notes (Unsigned)
 Patient ID: Kathleen Lucero                 DOB: [birth year]                      MRN: [id number]      HPI: Kathleen Lucero is a 68 y.o. female referred by Dr. Court to HTN and Lipid clinic. PMH is significant for CAD seen on CT on 03/12/24 (CAC score 652; 97th percentile), HLD, CKD3, diet-controlled T2DM, HTN, and goiter.  Patient was last seen in office by Dr. Court on 01/07/24 for a preoperative clearance prior to colonoscopy. BP in office was elevated at 158/80 on diltiazem  240 mg daily and spironolactone  25 mg daily. Her last LDL on 05/28/23 was 122 on statin therapy. Dr. Court ordered a CT coronary calcium  score, which revealed a CAC score of 652 (97th percentile) and referred to PharmD HTN clinic.  Patient was seen my Melissa on 02/18/24. Labs done at her PCP prior to visit in August showed an LDL-C of 109 on statin therapy, slight increases in A1c and microalbuminuria, and elevated BP. In clinic, her home BP readings averaged 121/65 and she was cutting her diltiazem  in half due to not feeling like she needed that much medication. The patient was educated that diltiazem  could not be cut in half due to it being controlled release. She also reported HR in the 40s-50s at home. In office, her BP was elevated at 180/92 and 166/88, which was likely attributed to white-coat syndrome. Her diltiazem  was reduced to 180 mg once daily due to low HR. Patient was educated that ARBs are renoprotective, and instructed to consider switching the diltiazem  to a ARB for better BP control and management of microalbuminuria. Last LDL on 03/15/24 showed an LDL 122 on rosuvastatin  20 mg. Patient also expressed not wanting to add any new medications during the visit, but was referred to PharmD to initiate a PSCK9i on 03/15/24 after CT coronary calcium  score results.  Current HTN meds: diltiazem  180 mg daily, spironolactone  25 mg daily Previously tried: valsartan /hydrochlorothiazide  (believes this contributed to her  kidney disease as she saw a decrease in GFR) BP goal: <130/80 Current lipid-lowering medications: LDL goal: <70 Family History:  No family history of cardiac disease Social History:  Tobacco: Alcohol: Diet:   Exercise:  {types:28256}  Home BP readings:  Date SBP/DBP  HR                              Average      Wt Readings from Last 3 Encounters:  03/30/24 195 lb (88.5 kg)  02/23/24 195 lb (88.5 kg)  01/09/24 192 lb (87.1 kg)   BP Readings from Last 3 Encounters:  02/23/24 137/76  02/18/24 (!) 166/88  01/09/24 136/70   Pulse Readings from Last 3 Encounters:  02/23/24 65  02/18/24 73  01/09/24 72    Renal function: CrCl cannot be calculated (Patient's most recent lab result is older than the maximum 21 days allowed.).  Past Medical History:  Diagnosis Date   Diabetes mellitus (HCC)    A1c 6.7 in 3/18   GERD (gastroesophageal reflux disease) 2007   Goiter    Heart murmur    my whole life   Hyperlipidemia    Hypertension    Lumbar degenerative disc disease    S/P ruptured disc, Dr. Renay Heading   T3 thyrotoxicosis 2011   S/P RAI  Uterine fibroid    Dr Cleotilde    Current Outpatient Medications on File Prior to Visit  Medication Sig Dispense Refill   diltiazem  (CARDIZEM  LA) 180 MG 24 hr tablet Take 1 tablet (180 mg total) by mouth daily. 90 tablet 3   rosuvastatin  (CRESTOR ) 20 MG tablet TAKE 1 TABLET BY MOUTH EVERY DAY 90 tablet 3   spironolactone  (ALDACTONE ) 25 MG tablet Take 1 tablet by mouth once daily 90 tablet 0   No current facility-administered medications on file prior to visit.    Allergies  Allergen Reactions   Jardiance  [Empagliflozin ] Nausea Only   Other Hives    Blood Pressure Medicine  This was before 2010; she'll discuss with [facility].   Codeine Sulfate     REACTION: nausea    Last menstrual period 05/14/2007.   Assessment/Plan: No BP recorded.  {Refresh Note OR Click here to enter BP  :1}***   1. Hypertension -  No  problem-specific Assessment & Plan notes found for this encounter.      Thank you  B. Maegan Adren Dollins, PharmD PGY-1 Pharmacy Resident [facility] 04/05/2024 11:58 AM   Melissa D Maccia, Pharm.JONETTA SARAN, CPP [facility] of [facility] [address], [place], [place] [address]  Phone: [phone]; Fax: [phone]

## 2024-04-06 ENCOUNTER — Ambulatory Visit: Admitting: Pharmacist

## 2024-04-06 VITALS — BP 160/95

## 2024-04-06 DIAGNOSIS — E782 Mixed hyperlipidemia: Secondary | ICD-10-CM | POA: Diagnosis not present

## 2024-04-06 DIAGNOSIS — I10 Essential (primary) hypertension: Secondary | ICD-10-CM

## 2024-04-06 NOTE — Patient Instructions (Addendum)
 Blood Pressure: Continue diltiazem  180 mg once daily and spironolactone  25 mg once daily Continue checking your blood pressure at home once daily Follow-up with Melissa on Friday, February 27 at 8 am  Cholesterol Continue taking rosuvastatin  20 mg once daily Get lipid labs drawn at [facility] (1st floor of [facility]) on Monday, February 23rd

## 2024-04-06 NOTE — Assessment & Plan Note (Signed)
 Assessment: Blood pressure elevated in clinic- most likely white coat HTN Home BP well controlled- has OMRON upper arm cuff bought at our pharmacy and her granddaughter who is an [employer] has verified its accuracy Reports HR in 40-50's at home, but remains asymptomatic Exercises for per week Home BP average 123/87 in the morning prior to taking her medications She is currently taking diltiazem  180 mg once daily and spironolactone  25 mg once daily.   Plan:  Continue diltiazem  to 180mg   daily and spironolactone  25 mg daily Continue to monitor BP at home. Pt is to record when BP is elevated and how she feels/what may have triggered it to be elevated Follow up with Melissa on 07/09/24

## 2024-04-06 NOTE — Assessment & Plan Note (Addendum)
 Assessment: LDL-C is 109 on rosuvastatin  20mg  daily Goal due to age, DM, and CAC score is <70 Does not want any more medications, as she has recently starting working with a registered dietician and want to try lifestyle modifications first CAC score came back at 652 (97th percentile)   Plan: Continue rosuvastatin  20 mg once daily Will repeat lipid panel in 3 months Follow-up with Melissa on 07/09/24

## 2024-04-22 ENCOUNTER — Telehealth (HOSPITAL_BASED_OUTPATIENT_CLINIC_OR_DEPARTMENT_OTHER): Payer: Self-pay | Admitting: *Deleted

## 2024-04-22 NOTE — Telephone Encounter (Signed)
° °  Name: Kathleen Lucero  DOB: 07/25/1955  MRN: [id number]  Primary Cardiologist: None   Preoperative team, please contact this patient and set up a phone call appointment for further preoperative risk assessment. Please obtain consent and complete medication review. Thank you for your help.  I confirm that guidance regarding antiplatelet and oral anticoagulation therapy has been completed and, if necessary, noted below.  None requested  I also confirmed the patient resides in the state of [place] . As per [facility] Medical Board telemedicine laws, the patient must reside in the state in which the provider is licensed.   Lum LITTIE Louis, NP 04/22/2024, 12:47 PM [facility]

## 2024-04-22 NOTE — Telephone Encounter (Signed)
° °  Pre-operative Risk Assessment    Patient Name: Kathleen Lucero  DOB: 1955-08-30 MRN: [id number]   Date of last office visit: 01/07/2024 Date of next office visit: None  Request for Surgical Clearance    Procedure:  Colonoscopy  Date of Surgery:  Clearance 05/24/24                                 Surgeon:  Dr. Renaye Sous Surgeon's Group or Practice Name:  [facility] Phone number:  [phone] Fax number:  [phone]   Type of Clearance Requested:   - Medical    Type of Anesthesia:  Propofol   Additional requests/questions:    Signed, Edsel Grayce Sanders   04/22/2024, 12:17 PM

## 2024-04-22 NOTE — Telephone Encounter (Signed)
 Left pt message to call back to schedule tele pre op appt.

## 2024-04-23 ENCOUNTER — Telehealth: Payer: Self-pay

## 2024-04-23 NOTE — Telephone Encounter (Signed)
 Patient returned Pre-op call.

## 2024-04-23 NOTE — Telephone Encounter (Signed)
 LVM asking pt to call our office to discuss preop clearance. 2nd attempt

## 2024-04-23 NOTE — Telephone Encounter (Signed)
°  Patient Consent for Virtual Visit        Kathleen Lucero has provided verbal consent on 04/23/2024 for a virtual visit (video or telephone).   CONSENT FOR VIRTUAL VISIT FOR:  Kathleen Lucero  By participating in this virtual visit I agree to the following:  I hereby voluntarily request, consent and authorize [facility] and its employed or contracted physicians, physician assistants, nurse practitioners or other licensed health care professionals (the Practitioner), to provide me with telemedicine health care services (the Services) as deemed necessary by the treating Practitioner. I acknowledge and consent to receive the Services by the Practitioner via telemedicine. I understand that the telemedicine visit will involve communicating with the Practitioner through live audiovisual communication technology and the disclosure of certain medical information by electronic transmission. I acknowledge that I have been given the opportunity to request an in-person assessment or other available alternative prior to the telemedicine visit and am voluntarily participating in the telemedicine visit.  I understand that I have the right to withhold or withdraw my consent to the use of telemedicine in the course of my care at any time, without affecting my right to future care or treatment, and that the Practitioner or I may terminate the telemedicine visit at any time. I understand that I have the right to inspect all information obtained and/or recorded in the course of the telemedicine visit and may receive copies of available information for a reasonable fee.  I understand that some of the potential risks of receiving the Services via telemedicine include:  Delay or interruption in medical evaluation due to technological equipment failure or disruption; Information transmitted may not be sufficient (e.g. poor resolution of images) to allow for appropriate medical decision making by the  Practitioner; and/or  In rare instances, security protocols could fail, causing a breach of personal health information.  Furthermore, I acknowledge that it is my responsibility to provide information about my medical history, conditions and care that is complete and accurate to the best of my ability. I acknowledge that Practitioner's advice, recommendations, and/or decision may be based on factors not within their control, such as incomplete or inaccurate data provided by me or distortions of diagnostic images or specimens that may result from electronic transmissions. I understand that the practice of medicine is not an exact science and that Practitioner makes no warranties or guarantees regarding treatment outcomes. I acknowledge that a copy of this consent can be made available to me via my patient portal [facility] MyChart), or I can request a printed copy by calling the office of [facility].    I understand that my insurance will be billed for this visit.   I have read or had this consent read to me. I understand the contents of this consent, which adequately explains the benefits and risks of the Services being provided via telemedicine.  I have been provided ample opportunity to ask questions regarding this consent and the Services and have had my questions answered to my satisfaction. I give my informed consent for the services to be provided through the use of telemedicine in my medical care

## 2024-04-23 NOTE — Telephone Encounter (Signed)
 Pt scheduled for VV on 12/30

## 2024-04-23 NOTE — Telephone Encounter (Signed)
 Attempted to return pt's call, LVMFCB to schedule preop tele appt

## 2024-05-11 ENCOUNTER — Ambulatory Visit: Attending: Cardiology | Admitting: Physician Assistant

## 2024-05-11 DIAGNOSIS — Z01810 Encounter for preprocedural cardiovascular examination: Secondary | ICD-10-CM

## 2024-05-11 NOTE — Progress Notes (Signed)
 "   Virtual Visit via Telephone Note   Because of Kathleen Lucero co-morbid illnesses, she is at least at moderate risk for complications without adequate follow up.  This format is felt to be most appropriate for this patient at this time.  Due to technical limitations with video connection (technology), today's appointment will be conducted as an audio only telehealth visit, and Kathleen Lucero verbally agreed to proceed in this manner.   All issues noted in this document were discussed and addressed.  No physical exam could be performed with this format.  Evaluation Performed:  Preoperative cardiovascular risk assessment _____________   Date:  05/11/2024   Patient ID:  Kathleen Lucero, DOB 04-05-1956, MRN [id number] Patient Location:  Home Provider location:   Office  Primary Care Provider:  Geofm Glade PARAS, MD Primary Cardiologist:  None  Chief Complaint / Patient Profile   68 y.o. y/o female with a h/o severe mitral valve regurgitation who is pending colonoscopy and presents today for telephonic preoperative cardiovascular risk assessment.  History of Present Illness    Kathleen Lucero is a 68 y.o. female who presents via audio/video conferencing for a telehealth visit today.  Pt was last seen in cardiology clinic on 01/07/2024 by Dr. Court.  At that time Kathleen Lucero was doing well.  The patient is now pending procedure as outlined above. Since her last visit, she has been doing well from a cardiac standpoint.  She has not had any shortness of breath or chest pain.  She recently had a calcium  score done.  She is pretty active but some activities are limited due to some back pain and sciatica.  She does surpass 4 METS on the DASI.   No medications are indicated as needing held.  Not on aspirin or any blood thinners.   Past Medical History    Past Medical History:  Diagnosis Date   Diabetes mellitus (HCC)    A1c 6.7 in 3/18   GERD (gastroesophageal reflux  disease) 2007   Goiter    Heart murmur    my whole life   Hyperlipidemia    Hypertension    Lumbar degenerative disc disease    S/P ruptured disc, Dr. Renay Heading   T3 thyrotoxicosis 2011   S/P RAI   Uterine fibroid    Dr Cleotilde   Past Surgical History:  Procedure Laterality Date   TUBAL LIGATION  1981   WISDOM TOOTH EXTRACTION      Allergies  Allergies[1]  Home Medications    Prior to Admission medications  Medication Sig Start Date End Date Taking? Authorizing Provider  diltiazem  (CARDIZEM  LA) 180 MG 24 hr tablet Take 1 tablet (180 mg total) by mouth daily. 02/18/24   Maccia, Melissa D, RPH-CPP  rosuvastatin  (CRESTOR ) 20 MG tablet TAKE 1 TABLET BY MOUTH EVERY DAY 02/24/24   Geofm Glade PARAS, MD  spironolactone  (ALDACTONE ) 25 MG tablet Take 1 tablet by mouth once daily 02/13/24   Geofm Glade PARAS, MD    Physical Exam    Vital Signs:  Kathleen Lucero does not have vital signs available for review today.  Given telephonic nature of communication, physical exam is limited. AAOx3. NAD. Normal affect.  Speech and respirations are unlabored.  Accessory Clinical Findings    None  Assessment & Plan    1.  Preoperative Cardiovascular Risk Assessment:  Kathleen Lucero perioperative risk of a major cardiac event is 0.4% according to the Revised Cardiac Risk Index (RCRI).  Therefore, she is at low risk  for perioperative complications.   Her functional capacity is good at 6.05 METs according to the [facility] Activity Status Index (DASI). Recommendations: According to ACC/AHA guidelines, no further cardiovascular testing needed.  The patient may proceed to surgery at acceptable risk.     The patient was advised that if she develops new symptoms prior to surgery to contact our office to arrange for a follow-up visit, and she verbalized understanding.   A copy of this note will be routed to requesting surgeon.  Time:   Today, I have spent 9 minutes with the patient with  telehealth technology discussing medical history, symptoms, and management plan.     Kathleen LOISE Fabry, PA-C  05/11/2024, 3:01 PM     [1]  Allergies Allergen Reactions   Jardiance  [Empagliflozin ] Nausea Only   Other Hives    Blood Pressure Medicine  This was before 2010; she'll discuss with [facility].   Codeine Sulfate     REACTION: nausea   "

## 2024-05-14 ENCOUNTER — Other Ambulatory Visit: Payer: Self-pay | Admitting: Internal Medicine

## 2024-05-30 ENCOUNTER — Other Ambulatory Visit: Payer: Self-pay | Admitting: Internal Medicine

## 2024-06-11 ENCOUNTER — Encounter: Admitting: Dietician

## 2024-07-09 ENCOUNTER — Ambulatory Visit: Admitting: Pharmacist

## 2024-07-14 ENCOUNTER — Encounter: Admitting: Internal Medicine

## 2024-12-02 ENCOUNTER — Ambulatory Visit
